# Patient Record
Sex: Female | Born: 1945 | Race: White | Hispanic: No | Smoking: Never smoker
Health system: Southern US, Community
[De-identification: ages and names within clinical notes are randomized; demographics above are authoritative.]

## PROBLEM LIST (undated history)

## (undated) DIAGNOSIS — F445 Conversion disorder with seizures or convulsions: Secondary | ICD-10-CM

## (undated) DIAGNOSIS — K219 Gastro-esophageal reflux disease without esophagitis: Secondary | ICD-10-CM

## (undated) DIAGNOSIS — F32A Depression, unspecified: Secondary | ICD-10-CM

## (undated) DIAGNOSIS — M1990 Unspecified osteoarthritis, unspecified site: Secondary | ICD-10-CM

## (undated) DIAGNOSIS — M810 Age-related osteoporosis without current pathological fracture: Secondary | ICD-10-CM

## (undated) DIAGNOSIS — F419 Anxiety disorder, unspecified: Secondary | ICD-10-CM

## (undated) DIAGNOSIS — F329 Major depressive disorder, single episode, unspecified: Secondary | ICD-10-CM

## (undated) DIAGNOSIS — M109 Gout, unspecified: Secondary | ICD-10-CM

## (undated) DIAGNOSIS — I219 Acute myocardial infarction, unspecified: Secondary | ICD-10-CM

## (undated) DIAGNOSIS — E785 Hyperlipidemia, unspecified: Secondary | ICD-10-CM

## (undated) DIAGNOSIS — G4730 Sleep apnea, unspecified: Secondary | ICD-10-CM

## (undated) DIAGNOSIS — C801 Malignant (primary) neoplasm, unspecified: Secondary | ICD-10-CM

## (undated) DIAGNOSIS — K209 Esophagitis, unspecified without bleeding: Secondary | ICD-10-CM

## (undated) DIAGNOSIS — I10 Essential (primary) hypertension: Secondary | ICD-10-CM

## (undated) HISTORY — PX: TONSILLECTOMY: SUR1361

## (undated) HISTORY — DX: Hyperlipidemia, unspecified: E78.5

## (undated) HISTORY — PX: MULTIPLE TOOTH EXTRACTIONS: SHX2053

## (undated) HISTORY — DX: Essential (primary) hypertension: I10

## (undated) HISTORY — DX: Malignant (primary) neoplasm, unspecified: C80.1

## (undated) HISTORY — PX: WISDOM TOOTH EXTRACTION: SHX21

## (undated) HISTORY — PX: OTHER SURGICAL HISTORY: SHX169

---

## 2008-06-09 ENCOUNTER — Other Ambulatory Visit: Payer: Self-pay

## 2008-06-09 ENCOUNTER — Inpatient Hospital Stay: Payer: Self-pay | Admitting: Internal Medicine

## 2008-08-24 ENCOUNTER — Ambulatory Visit: Payer: Self-pay | Admitting: Gastroenterology

## 2009-10-29 ENCOUNTER — Inpatient Hospital Stay: Payer: Self-pay | Admitting: Internal Medicine

## 2010-04-28 ENCOUNTER — Observation Stay: Payer: Self-pay | Admitting: Internal Medicine

## 2010-05-25 ENCOUNTER — Inpatient Hospital Stay: Payer: Self-pay | Admitting: Internal Medicine

## 2010-11-12 HISTORY — PX: CARDIAC CATHETERIZATION: SHX172

## 2011-02-14 ENCOUNTER — Ambulatory Visit: Payer: Self-pay

## 2011-05-13 HISTORY — PX: CARDIAC CATHETERIZATION: SHX172

## 2011-05-28 ENCOUNTER — Observation Stay: Payer: Self-pay | Admitting: Internal Medicine

## 2011-09-13 HISTORY — PX: CARDIAC CATHETERIZATION: SHX172

## 2011-10-04 ENCOUNTER — Inpatient Hospital Stay: Payer: Self-pay | Admitting: Internal Medicine

## 2011-10-11 ENCOUNTER — Emergency Department: Payer: Self-pay | Admitting: Unknown Physician Specialty

## 2011-10-15 ENCOUNTER — Observation Stay: Payer: Self-pay | Admitting: Internal Medicine

## 2012-11-06 ENCOUNTER — Emergency Department: Payer: Self-pay | Admitting: Emergency Medicine

## 2012-11-06 LAB — COMPREHENSIVE METABOLIC PANEL
Albumin: 4.2 g/dL (ref 3.4–5.0)
Anion Gap: 9 (ref 7–16)
BUN: 15 mg/dL (ref 7–18)
Creatinine: 0.9 mg/dL (ref 0.60–1.30)
Glucose: 84 mg/dL (ref 65–99)
Osmolality: 283 (ref 275–301)
Potassium: 3.3 mmol/L — ABNORMAL LOW (ref 3.5–5.1)
SGOT(AST): 26 U/L (ref 15–37)
Sodium: 142 mmol/L (ref 136–145)
Total Protein: 7.9 g/dL (ref 6.4–8.2)

## 2012-11-06 LAB — CBC
HCT: 37.8 % (ref 35.0–47.0)
HGB: 12.6 g/dL (ref 12.0–16.0)
MCH: 29.1 pg (ref 26.0–34.0)
MCHC: 33.2 g/dL (ref 32.0–36.0)
MCV: 88 fL (ref 80–100)
RDW: 13.8 % (ref 11.5–14.5)

## 2012-11-06 LAB — TROPONIN I: Troponin-I: 0.18 ng/mL — ABNORMAL HIGH

## 2012-11-11 ENCOUNTER — Ambulatory Visit (INDEPENDENT_AMBULATORY_CARE_PROVIDER_SITE_OTHER): Payer: 59 | Admitting: Cardiovascular Disease

## 2012-11-11 ENCOUNTER — Encounter: Payer: Self-pay | Admitting: Cardiovascular Disease

## 2012-11-11 VITALS — BP 142/74 | HR 58 | Ht 65.0 in | Wt 180.8 lb

## 2012-11-11 DIAGNOSIS — R079 Chest pain, unspecified: Secondary | ICD-10-CM

## 2012-11-11 DIAGNOSIS — I10 Essential (primary) hypertension: Secondary | ICD-10-CM

## 2012-11-11 DIAGNOSIS — E785 Hyperlipidemia, unspecified: Secondary | ICD-10-CM

## 2012-11-11 DIAGNOSIS — R0602 Shortness of breath: Secondary | ICD-10-CM

## 2012-11-11 NOTE — Patient Instructions (Addendum)
Your physician has requested that you have an echocardiogram. Echocardiography is a painless test that uses sound waves to create images of your heart. It provides your doctor with information about the size and shape of your heart and how well your heart's chambers and valves are working. This procedure takes approximately one hour. There are no restrictions for this procedure.  Follow up after echo.

## 2012-11-12 ENCOUNTER — Encounter: Payer: Self-pay | Admitting: Cardiovascular Disease

## 2012-11-12 DIAGNOSIS — R079 Chest pain, unspecified: Secondary | ICD-10-CM | POA: Insufficient documentation

## 2012-11-12 DIAGNOSIS — E785 Hyperlipidemia, unspecified: Secondary | ICD-10-CM | POA: Insufficient documentation

## 2012-11-12 DIAGNOSIS — I10 Essential (primary) hypertension: Secondary | ICD-10-CM | POA: Insufficient documentation

## 2012-11-12 NOTE — Assessment & Plan Note (Signed)
I agree with treatment with atorvastatin which might be helpful in cases of endothelial dysfunction.

## 2012-11-12 NOTE — Assessment & Plan Note (Signed)
Recurrent chest pain with mildly elevated troponin with no evidence of obstructive coronary artery disease on cardiac catheterization which was done 3 times since 2010. 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 On one angiography,  I noted slightly sluggish coronary flow especially in the LAD. The other possibility is nonischemic cardiomyopathy given that her previous ejection fraction was mildly decreased. Thus, I will request an echocardiogram to evaluate LV systolic function, RV systolic function and pulmonary pressure.

## 2012-11-12 NOTE — Assessment & Plan Note (Signed)
Blood pressure is well controlled 

## 2012-11-12 NOTE — Progress Notes (Signed)
HPI  This is a 67 year old female who was referred from the emergency room at [facility] for a second opinion regarding recurrent chest pain and mildly elevated cardiac enzymes. She has chronic medical conditions including hypertension and hyperlipidemia. I performed cardiac catheterization on her in 2010 which showed minor luminal irregularities with mildly reduced LV systolic function with an ejection fraction of 45% and no evidence of renal artery stenosis. She has been following with Dr. Humphrey Rolls. She underwent cardiac catheterization twice in 2012 in July and in November for recurrent chest pain and mildly elevated troponin level. Both of these catheterizations showed no evidence of obstructive coronary artery disease. I reviewed these personally. She presented recently to [facility] ER with similar chest pain. Her troponin was 0.18. She denies significant dyspnea. No palpitations, syncope or presyncope.  Allergies  Allergen Reactions  . Codeine      Current Outpatient Prescriptions on File Prior to Visit  Medication Sig Dispense Refill  . amLODipine (NORVASC) 5 MG tablet Take 5 mg by mouth daily.      Marland Kitchen atorvastatin (LIPITOR) 20 MG tablet Take 20 mg by mouth daily.      . carvedilol (COREG) 6.25 MG tablet Take 6.25 mg by mouth 2 (two) times daily with a meal.      . furosemide (LASIX) 20 MG tablet Take 20 mg by mouth daily.      Marland Kitchen losartan (COZAAR) 25 MG tablet Take 25 mg by mouth daily.         Past Medical History  Diagnosis Date  . Cancer     cervical  . Hypertension   . Hyperlipidemia      Past Surgical History  Procedure Date  . Multiple tooth extractions   . Colonoscopy   . Cardiac catheterization 05/2011    [facility]  . [facility]/2011    [facility]  . [facility]iac catheter[facility] 2012    [facility]     Family History  Problem Relation Age of Onset  . Heart disease Mother   . Heart failure Father      History   Social History  . Marital  Status: Single    Spouse Name: N/A    Number of Children: N/A  . Years of Education: N/A   Occupational History  . Not on file.   Social History Main Topics  . Smoking status: Never Smoker   . Smokeless tobacco: Not on file  . Alcohol Use: No  . Drug Use: No  . Sexually Active:    Other Topics Concern  . Not on file   Social History Narrative  . No narrative on file     ROS Constitutional: Negative for fever, chills, diaphoresis, activity change, appetite change and fatigue.  HENT: Negative for hearing loss, nosebleeds, congestion, sore throat, facial swelling, drooling, trouble swallowing, neck pain, voice change, sinus pressure and tinnitus.  Eyes: Negative for photophobia, pain, discharge and visual disturbance.  Respiratory: Negative for apnea, cough, chest tightness, shortness of breath and wheezing.  Cardiovascular: Negative for  palpitations and leg swelling.  Gastrointestinal: Negative for nausea, vomiting, abdominal pain, diarrhea, constipation, blood in stool and abdominal distention.  Genitourinary: Negative for dysuria, urgency, frequency, hematuria and decreased urine volume.  Musculoskeletal: Negative for myalgias, back pain, joint swelling, arthralgias and gait problem.  Skin: Negative for color change, pallor, rash and wound.  Neurological: Negative for dizziness, tremors, seizures, syncope, speech difficulty, weakness, light-headedness, numbness and headaches.  Psychiatric/Behavioral: Negative for suicidal ideas, hallucinations,  behavioral problems and agitation. The patient is not nervous/anxious.      PHYSICAL EXAM   BP 142/74  Pulse 58  Ht 5\' 5"  (1.651 m)  Wt 180 lb 12 oz (81.988 kg)  BMI 30.08 kg/m2 Constitutional: She is oriented to person, place, and time. She appears well-developed and well-nourished. No distress.  HENT: No nasal discharge.  Head: Normocephalic and atraumatic.  Eyes: Pupils are equal and round. Right eye exhibits no discharge.  Left eye exhibits no discharge.  Neck: Normal range of motion. Neck supple. No JVD present. No thyromegaly present.  Cardiovascular: Normal rate, regular rhythm, normal heart sounds. Exam reveals no gallop and no friction rub. There is a 1/6 systolic ejection murmur at the aortic area.  Pulmonary/Chest: Effort normal and breath sounds normal. No stridor. No respiratory distress. She has no wheezes. She has no rales. She exhibits no tenderness.  Abdominal: Soft. Bowel sounds are normal. She exhibits no distension. There is no tenderness. There is no rebound and no guarding.  Musculoskeletal: Normal range of motion. She exhibits no edema and no tenderness.  Neurological: She is alert and oriented to person, place, and time. Coordination normal.  Skin: Skin is warm and dry. No rash noted. She is not diaphoretic. No erythema. No pallor.  Psychiatric: She has a normal mood and affect. Her behavior is normal. Judgment and thought content normal.     EKG: Sinus  Bradycardia  WITHIN NORMAL LIMITS   ASSESSMENT AND PLAN

## 2012-12-02 ENCOUNTER — Other Ambulatory Visit (INDEPENDENT_AMBULATORY_CARE_PROVIDER_SITE_OTHER): Payer: 59

## 2012-12-02 ENCOUNTER — Other Ambulatory Visit: Payer: Self-pay

## 2012-12-02 DIAGNOSIS — R002 Palpitations: Secondary | ICD-10-CM

## 2012-12-02 DIAGNOSIS — R079 Chest pain, unspecified: Secondary | ICD-10-CM

## 2012-12-02 DIAGNOSIS — R0602 Shortness of breath: Secondary | ICD-10-CM

## 2012-12-02 DIAGNOSIS — I359 Nonrheumatic aortic valve disorder, unspecified: Secondary | ICD-10-CM

## 2012-12-08 ENCOUNTER — Encounter: Payer: Self-pay | Admitting: Cardiovascular Disease

## 2012-12-08 ENCOUNTER — Ambulatory Visit (INDEPENDENT_AMBULATORY_CARE_PROVIDER_SITE_OTHER): Payer: 59 | Admitting: Cardiovascular Disease

## 2012-12-08 VITALS — BP 140/83 | HR 64 | Ht 64.0 in | Wt 184.5 lb

## 2012-12-08 DIAGNOSIS — I10 Essential (primary) hypertension: Secondary | ICD-10-CM

## 2012-12-08 DIAGNOSIS — R079 Chest pain, unspecified: Secondary | ICD-10-CM

## 2012-12-08 NOTE — Assessment & Plan Note (Signed)
Blood pressure is reasonably controlled. 

## 2012-12-08 NOTE — Patient Instructions (Addendum)
Continue same medications.  Start an exercise program.  Follow up as needed.

## 2012-12-08 NOTE — Progress Notes (Signed)
HPI  This is a 67 year old female who is here today for a followup visit after a second opinion regarding recurrent chest pain and mildly elevated cardiac enzymes. She has chronic medical conditions including hypertension and hyperlipidemia. I performed cardiac catheterization on her in 2010 which showed minor luminal irregularities with mildly reduced LV systolic function with an ejection fraction of 45% and no evidence of renal artery stenosis. She has been following with Dr. Humphrey Rolls. She underwent cardiac catheterization twice in 2012 in July and in November for recurrent chest pain and mildly elevated troponin level. Both of these catheterizations showed no evidence of obstructive coronary artery disease. I reviewed these personally. She presented recently to [facility] ER with similar chest pain. Her troponin was 0.18.  No palpitations, syncope or presyncope. She had an echocardiogram done which showed normal LV systolic function, no significant valvular abnormalities and no evidence of pulmonary hypertension. She denies recurrent chest pain. She does have exertional with moderate activities. She is a lifelong nonsmoker but has been exposed to secondhand smoking throughout her life.  Allergies  Allergen Reactions  . Albuterol   . Codeine      Current Outpatient Prescriptions on File Prior to Visit  Medication Sig Dispense Refill  . Acetaminophen (TYLENOL PO) Take by mouth as needed.      Marland Kitchen amLODipine (NORVASC) 5 MG tablet Take 5 mg by mouth daily.      Marland Kitchen aspirin 81 MG tablet Take 81 mg by mouth daily.      Marland Kitchen atorvastatin (LIPITOR) 20 MG tablet Take 20 mg by mouth daily.      . carvedilol (COREG) 6.25 MG tablet Take 6.25 mg by mouth 2 (two) times daily with a meal.      . furosemide (LASIX) 20 MG tablet Take 20 mg by mouth daily.      Marland Kitchen losartan (COZAAR) 25 MG tablet Take 25 mg by mouth daily.      . Multiple Vitamin (MULTIVITAMIN) tablet Take 1 tablet by mouth  daily.         Past Medical History  Diagnosis Date  . Cancer     cervical  . Hypertension   . Hyperlipidemia      Past Surgical History  Procedure Date  . Multiple tooth extractions   . Colonoscopy   . Cardiac catheterization 05/2011    [facility]  . [facility]2011    [facility]  . [facility]iac catheterization 2012    [facility]     Family History  Problem Relation Age of Onset  . Heart disease Mother   . Heart failure Father      History   Social History  . Marital Status: Single    Spouse Name: N/A    Number of Children: N/A  . Years of Education: N/A   Occupational History  . Not on file.   Social History Main Topics  . Smoking status: Never Smoker   . Smokeless tobacco: Not on file  . Alcohol Use: No  . Drug Use: No  . Sexually Active:    Other Topics Concern  . Not on file   Social History Narrative  . No narrative on file        PHYSICAL EXAM   BP 140/83  Pulse 64  Ht 5\' 4"  (1.626 m)  Wt 184 lb 8 oz (83.689 kg)  BMI 31.67 kg/m2 Constitutional: She is oriented to person, place, and time. She appears well-developed and well-nourished. No distress.  HENT: No nasal discharge.  Head: Normocephalic and atraumatic.  Eyes: Pupils are equal and round. Right eye exhibits no discharge. Left eye exhibits no discharge.  Neck: Normal range of motion. Neck supple. No JVD present. No thyromegaly present.  Cardiovascular: Normal rate, regular rhythm, normal heart sounds. Exam reveals no gallop and no friction rub. There is a 1/6 systolic ejection murmur at the aortic area.  Pulmonary/Chest: Effort normal and breath sounds normal. No stridor. No respiratory distress. She has no wheezes. She has no rales. She exhibits no tenderness.  Abdominal: Soft. Bowel sounds are normal. She exhibits no distension. There is no tenderness. There is no rebound and no guarding.  Musculoskeletal: Normal range of motion. She exhibits no edema and no tenderness.    Neurological: She is alert and oriented to person, place, and time. Coordination normal.  Skin: Skin is warm and dry. No rash noted. She is not diaphoretic. No erythema. No pallor.  Psychiatric: She has a normal mood and affect. Her behavior is normal. Judgment and thought content normal.      ASSESSMENT AND PLAN

## 2012-12-08 NOTE — Assessment & Plan Note (Signed)
Recurrent chest pain with mildly elevated troponin with no evidence of obstructive coronary artery disease on cardiac catheterization which was done 3 times since 2010. 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 On one angiography,  there was sluggish coronary flow especially in the LAD. Echocardiogram showed normal LV systolic function with no evidence of pulmonary hypertension. Continue medical therapy. She does have sublingual nitroglycerin to be used as needed. I advised her to start an exercise program at least 5 days a week. She will be following with Dr.Khan.

## 2013-04-02 ENCOUNTER — Ambulatory Visit: Payer: Self-pay | Admitting: Nurse Practitioner

## 2013-08-11 ENCOUNTER — Emergency Department: Payer: Self-pay | Admitting: Emergency Medicine

## 2013-08-11 LAB — TROPONIN I
Troponin-I: 0.14 ng/mL — ABNORMAL HIGH
Troponin-I: 0.14 ng/mL — ABNORMAL HIGH

## 2013-08-11 LAB — CBC
HGB: 11.4 g/dL — ABNORMAL LOW (ref 12.0–16.0)
MCH: 30.4 pg (ref 26.0–34.0)
MCHC: 34.5 g/dL (ref 32.0–36.0)
Platelet: 182 10*3/uL (ref 150–440)
RDW: 14.3 % (ref 11.5–14.5)
WBC: 7 10*3/uL (ref 3.6–11.0)

## 2013-08-11 LAB — BASIC METABOLIC PANEL
Anion Gap: 5 — ABNORMAL LOW (ref 7–16)
Calcium, Total: 8.9 mg/dL (ref 8.5–10.1)
Co2: 30 mmol/L (ref 21–32)
Creatinine: 1.15 mg/dL (ref 0.60–1.30)
EGFR (African American): 57 — ABNORMAL LOW
Glucose: 119 mg/dL — ABNORMAL HIGH (ref 65–99)
Osmolality: 278 (ref 275–301)
Potassium: 3.2 mmol/L — ABNORMAL LOW (ref 3.5–5.1)
Sodium: 137 mmol/L (ref 136–145)

## 2013-08-11 LAB — PRO B NATRIURETIC PEPTIDE: B-Type Natriuretic Peptide: 172 pg/mL — ABNORMAL HIGH (ref 0–125)

## 2014-08-08 ENCOUNTER — Emergency Department: Payer: Self-pay | Admitting: Emergency Medicine

## 2014-08-08 LAB — CBC
HCT: 37.2 % (ref 35.0–47.0)
HGB: 12.1 g/dL (ref 12.0–16.0)
MCH: 29.9 pg (ref 26.0–34.0)
MCHC: 32.5 g/dL (ref 32.0–36.0)
MCV: 92 fL (ref 80–100)
PLATELETS: 154 10*3/uL (ref 150–440)
RBC: 4.05 10*6/uL (ref 3.80–5.20)
RDW: 13.6 % (ref 11.5–14.5)
WBC: 9.1 10*3/uL (ref 3.6–11.0)

## 2014-08-08 LAB — BASIC METABOLIC PANEL
Anion Gap: 7 (ref 7–16)
BUN: 18 mg/dL (ref 7–18)
CHLORIDE: 102 mmol/L (ref 98–107)
CO2: 30 mmol/L (ref 21–32)
Calcium, Total: 8.2 mg/dL — ABNORMAL LOW (ref 8.5–10.1)
Creatinine: 1.26 mg/dL (ref 0.60–1.30)
EGFR (African American): 54 — ABNORMAL LOW
EGFR (Non-African Amer.): 45 — ABNORMAL LOW
GLUCOSE: 112 mg/dL — AB (ref 65–99)
OSMOLALITY: 280 (ref 275–301)
POTASSIUM: 3.3 mmol/L — AB (ref 3.5–5.1)
Sodium: 139 mmol/L (ref 136–145)

## 2014-08-08 LAB — TROPONIN I
TROPONIN-I: 0.22 ng/mL — AB
TROPONIN-I: 0.21 ng/mL — AB

## 2015-03-05 NOTE — Consult Note (Signed)
PATIENT NAME:  Elaine Norris, DELERME MR#:  [id number] DATE OF BIRTH:  09-13-1946  DATE OF CONSULTATION:  08/08/2014  REFERRING PHYSICIAN:   CONSULTING PHYSICIAN:  Dionisio David, MD  INDICATION FOR CONSULTATION: Atypical chest pain.   HISTORY OF PRESENT ILLNESS: This is a 69 year old, white female, with a past medical history of mild coronary artery disease, who presented to the Emergency Room with severe back pain. She has history of osteoporosis and she was started on new medications, and she came in complaining of severe back pain radiating to the front of the chest. She underwent CTA angiography of the aorta, which showed no significant changes, no evidence of dissection. She also has apparently mildly elevated troponin chronically.   Her past medical history is significant for having cardiac catheterization in the past, a few years back, which was negative, and had a stress test recently which was unremarkable.   Her troponin was 0.21 and she always has a little bit elevated troponin; thus, I was asked to evaluate the patient.   The patient at this time denies any chest pain or shortness of breath. Back pain is resolving.   PAST MEDICAL HISTORY: As mentioned, has had cardiac catheterization in the past.   SOCIAL HISTORY: Unremarkable.   FAMILY HISTORY: Positive for coronary artery disease.   PHYSICAL EXAMINATION:  GENERAL: She is alert, oriented x 3, in no acute distress.  VITAL SIGNS: Her blood pressure is a little bit elevated, 180/77, pulse is 102, respirations 14, saturation is 100.  NECK: Revealed no JVD.  LUNGS: Clear.  HEART: Regular rate and rhythm. Normal S1, S2. No audible murmur.  ABDOMEN: Soft, nontender, positive bowel sounds.  EXTREMITIES: No pedal edema.   LABORATORY AND DIAGNOSTIC DATA: EKG shows normal sinus rhythm, 67 beats per minute within normal limits.   Troponin is 0.21, BUN 18, creatinine 1.26, potassium 3.3. Troponin second set was also 0.22.   White  count is 9.1, hemoglobin is 12.1.   ASSESSMENT AND PLAN: The patient has atypical chest pain, but mostly back pain. CT angiogram was negative to rule out dissection. She has chronically elevated troponin. She had a negative cardiac catheterization in the past. It is reasonable to discharge the patient with a follow-up in the office tomorrow at 10:00.   Thank you very much for the referral.    ____________________________ Dionisio David, MD sak:JT D: 08/08/2014 11:49:06 ET T: 08/08/2014 12:25:02 ET JOB#: [id number]  cc: Dionisio David, MD, <Dictator> Dionisio David MD ELECTRONICALLY SIGNED 08/19/2014 9:55

## 2015-03-08 ENCOUNTER — Other Ambulatory Visit: Payer: Self-pay | Admitting: Internal Medicine

## 2015-03-08 DIAGNOSIS — Z1231 Encounter for screening mammogram for malignant neoplasm of breast: Secondary | ICD-10-CM

## 2015-03-17 ENCOUNTER — Ambulatory Visit
Admission: RE | Admit: 2015-03-17 | Discharge: 2015-03-17 | Disposition: A | Discharge status: Home / Self Care | Payer: Medicare Other | Source: Ambulatory Visit | Attending: Internal Medicine | Admitting: Internal Medicine

## 2015-03-17 ENCOUNTER — Other Ambulatory Visit: Payer: Self-pay | Admitting: Internal Medicine

## 2015-03-17 DIAGNOSIS — Z1231 Encounter for screening mammogram for malignant neoplasm of breast: Secondary | ICD-10-CM

## 2016-04-06 ENCOUNTER — Other Ambulatory Visit: Payer: Self-pay | Admitting: Internal Medicine

## 2016-04-06 DIAGNOSIS — Z1231 Encounter for screening mammogram for malignant neoplasm of breast: Secondary | ICD-10-CM

## 2016-04-24 ENCOUNTER — Other Ambulatory Visit: Payer: Self-pay | Admitting: Internal Medicine

## 2016-04-24 ENCOUNTER — Ambulatory Visit
Admission: RE | Admit: 2016-04-24 | Discharge: 2016-04-24 | Disposition: A | Discharge status: Home / Self Care | Payer: Medicare Other | Source: Ambulatory Visit | Attending: Internal Medicine | Admitting: Internal Medicine

## 2016-04-24 DIAGNOSIS — Z1231 Encounter for screening mammogram for malignant neoplasm of breast: Secondary | ICD-10-CM | POA: Diagnosis not present

## 2016-12-26 ENCOUNTER — Emergency Department: Payer: Medicare Other

## 2016-12-26 ENCOUNTER — Emergency Department
Admission: EM | Admit: 2016-12-26 | Discharge: 2016-12-26 | Disposition: A | Discharge status: Home / Self Care | Payer: Medicare Other | Attending: Emergency Medicine | Admitting: Emergency Medicine

## 2016-12-26 ENCOUNTER — Encounter: Payer: Self-pay | Admitting: Emergency Medicine

## 2016-12-26 DIAGNOSIS — M25511 Pain in right shoulder: Secondary | ICD-10-CM | POA: Insufficient documentation

## 2016-12-26 DIAGNOSIS — Z79899 Other long term (current) drug therapy: Secondary | ICD-10-CM | POA: Diagnosis not present

## 2016-12-26 DIAGNOSIS — M542 Cervicalgia: Secondary | ICD-10-CM | POA: Diagnosis not present

## 2016-12-26 DIAGNOSIS — Z7982 Long term (current) use of aspirin: Secondary | ICD-10-CM | POA: Insufficient documentation

## 2016-12-26 DIAGNOSIS — Z8541 Personal history of malignant neoplasm of cervix uteri: Secondary | ICD-10-CM | POA: Diagnosis not present

## 2016-12-26 DIAGNOSIS — I10 Essential (primary) hypertension: Secondary | ICD-10-CM | POA: Insufficient documentation

## 2016-12-26 MED ORDER — PREDNISONE 10 MG (21) PO TBPK: 10.0000 mg | ORAL_TABLET | Freq: Every day | ORAL | 0 refills | Status: DC

## 2016-12-26 MED ORDER — TRAMADOL HCL 50 MG PO TABS
ORAL_TABLET | ORAL | Status: AC
  Filled 2016-12-26: qty 1

## 2016-12-26 MED ORDER — TRAMADOL HCL 50 MG PO TABS
25.0000 mg | ORAL_TABLET | Freq: Once | ORAL | Status: AC
  Administered 2016-12-26: 25 mg via ORAL

## 2016-12-26 NOTE — ED Provider Notes (Signed)
[facility] Emergency Department Provider Note  ____________________________________________  Time seen: Approximately 8:22 PM  I have reviewed the triage vital signs and the nursing notes.   HISTORY  Chief Complaint Neck Pain; Shoulder Pain; and Chest Pain    HPI Elaine Norris is a 71 y.o. female presenting to the emergency department with 9 out of 10 right shoulder pain that radiates to the elbow but not into the hands for the past 3 days. Patient describes right shoulder pain as aching. Patient has noticed that it has becoming difficult for her to reach up and behind her. Patient is sleeping on her left shoulder to avoid sleeping on the right shoulder. Right shoulder pain is keeping her up at night. Patient has only taken Tylenol for pain. Patient denies chest pain, chest tightness, shortness of breath, nausea, vomiting and a feeling of impending doom.   Past Medical History:  Diagnosis Date  . Cancer (HCC)    cervical  . Hyperlipidemia   . Hypertension     Patient Active Problem List   Diagnosis Date Noted  . Chest pain 11/12/2012  . Hypertension   . Hyperlipidemia     Past Surgical History:  Procedure Laterality Date  . CARDIAC CATHETERIZATION  05/2011   [facility]  . CARDIAC CATHETERIZATION  09/2011   [facility]  . CARDIAC CATHETERIZATION  2012   [facility]  . COLONOSCOPY    . MULTIPLE TOOTH EXTRACTIONS      Prior to Admission medications   Medication Sig Start Date End Date Taking? Authorizing Provider  Acetaminophen (TYLENOL PO) Take by mouth as needed.    Historical Provider, MD  amLODipine (NORVASC) 5 MG tablet Take 5 mg by mouth daily.    Historical Provider, MD  aspirin 81 MG tablet Take 81 mg by mouth daily.    Historical Provider, MD  atorvastatin (LIPITOR) 20 MG tablet Take 20 mg by mouth daily.    Historical Provider, MD  carvedilol (COREG) 6.25 MG tablet Take 6.25 mg by mouth 2 (two) times daily with a meal.    Historical Provider, MD   furosemide (LASIX) 20 MG tablet Take 20 mg by mouth daily.    Historical Provider, MD  losartan (COZAAR) 25 MG tablet Take 25 mg by mouth daily.    Historical Provider, MD  Multiple Vitamin (MULTIVITAMIN) tablet Take 1 tablet by mouth daily.    Historical Provider, MD  predniSONE (STERAPRED UNI-PAK 21 TAB) 10 MG (21) TBPK tablet Take 1 tablet (10 mg total) by mouth daily. Take 6 tablets the first day, take 5 tablets the second day, take 4 tablets the third day, take 3 tablets the fourth day, take 2 tablets the fifth day, take 1 tablet the sixth day. 12/26/16   Lannie Fields, PA-C    Allergies Albuterol and Codeine  Family History  Problem Relation Age of Onset  . Heart disease Mother   . Heart failure Father     Social History Social History  Substance Use Topics  . Smoking status: Never Smoker  . Smokeless tobacco: Never Used  . Alcohol use No     Review of Systems  Constitutional: No fever/chills Eyes: No visual changes. No discharge ENT: No upper respiratory complaints. Cardiovascular: no chest pain. Respiratory: no cough. No SOB. Gastrointestinal: No abdominal pain.  No nausea, no vomiting.  No diarrhea.  No constipation. Musculoskeletal: Patient has right shoulder pain.  Skin: Negative for rash, abrasions, lacerations, ecchymosis. Neurological: Negative for headaches, focal weakness or numbness.  ____________________________________________   PHYSICAL EXAM:  VITAL SIGNS: ED Triage Vitals  Enc Vitals Group     BP 12/26/16 1926 (!) 150/99     Pulse Rate 12/26/16 1926 72     Resp 12/26/16 1926 16     Temp 12/26/16 1926 98.2 F (36.8 C)     Temp Source 12/26/16 1926 Oral     SpO2 12/26/16 1926 99 %     Weight 12/26/16 1926 190 lb (86.2 kg)     Height 12/26/16 1926 5\' 5"  (1.651 m)     Head Circumference --      Peak Flow --      Pain Score 12/26/16 2000 9     Pain Loc --      Pain Edu? --      Excl. in [place]? --      Constitutional: Alert and oriented. Well  appearing and in no acute distress. Eyes: Conjunctivae are normal. PERRL. EOMI. Head: Atraumatic. Neck: No stridor. Full range of motion. Patient's symptoms are not reproduced with range of motion at the neck. Cardiovascular: Normal rate, regular rhythm. Normal S1 and S2.  Good peripheral circulation. Respiratory: Normal respiratory effort without tachypnea or retractions. Lungs CTAB. Good air entry to the bases with no decreased or absent breath sounds. Musculoskeletal: To inspection, patient is holding right shoulder higher than the left. Right upper extremity: Patient is unable to perform full range of motion at the shoulder, likely secondary to pain. Patient has full range of motion at the elbow and wrist. Patient has pain with palpation over the deltoid and supraspinatus. Patient has pain and weakness with rotator cuff testing. Palpable radial and ulnar pulses bilaterally and symmetrically. Neurologic:  Normal speech and language. No gross focal neurologic deficits are appreciated. Reflexes are 2+ and symmetric in the upper extremities bilaterally. Skin:  Skin is warm, dry and intact. No rash noted. Psychiatric: Mood and affect are normal. Speech and behavior are normal. Patient exhibits appropriate insight and judgement.   ____________________________________________   LABS (all labs ordered are listed, but only abnormal results are displayed)  Labs Reviewed - No data to display ____________________________________________  EKG   ____________________________________________  RADIOLOGY Unk Pinto, personally viewed and evaluated these images (plain radiographs) as part of my medical decision making, as well as reviewing the written report by the radiologist.    Dg Cervical Spine Complete  Result Date: 12/26/2016 CLINICAL DATA:  Right neck pain radiating through shoulder and right arm for 3 days. No injury. EXAM: CERVICAL SPINE - COMPLETE 4+ VIEW COMPARISON:  None.  FINDINGS: Normal alignment of the cervical spine. No vertebral compression deformities. No prevertebral soft tissue swelling. Degenerative changes throughout the cervical spine with narrowed cervical interspaces and endplate hypertrophic changes. Degenerative changes in the facet joints. No significant bone encroachment upon the neural foramina. No focal bone lesion or bone destruction. Vascular calcifications in the soft tissues. IMPRESSION: Normal alignment of the cervical spine. Diffuse degenerative changes. No acute displaced fractures identified. Electronically Signed   By: Lucienne Capers M.D.   On: 12/26/2016 21:20   Dg Shoulder Right  Result Date: 12/26/2016 CLINICAL DATA:  Right neck pain radiating through the shoulder and down the right arm for 3 days. No injury. EXAM: RIGHT SHOULDER - 2+ VIEW COMPARISON:  None. FINDINGS: There is no evidence of fracture or dislocation. There is no evidence of arthropathy or other focal bone abnormality. Soft tissues are unremarkable. IMPRESSION: Negative. Electronically Signed   By: Lucienne Capers  M.D.   On: 12/26/2016 21:18    ____________________________________________    PROCEDURES  Procedure(s) performed:    Procedures    Medications  traMADol (ULTRAM) tablet 25 mg (25 mg Oral Given 12/26/16 2025)     ____________________________________________   INITIAL IMPRESSION / ASSESSMENT AND PLAN / ED COURSE  Pertinent labs & imaging results that were available during my care of the patient were reviewed by me and considered in my medical decision making (see chart for details).  Review of the [place] CSRS was performed in accordance of the [place] prior to dispensing any controlled drugs.     Assessment and Plan: Right Shoulder Pain  Patient presents to the emergency department with right shoulder pain. DG right shoulder idicates no acute bony abnormalities. DG cervical spine indicates diffuse degenerative changes. On physical exam, patient  did not have reproducible symptoms with range of motion testing at the neck. In addition, patient does not have referred pain extending into the hand. Patient's pain stops at the right elbow, increasing suspicion for rotator cuff pathology. Patient was discharged with tapered prednisone. A referral was made to orthopedics, Dr. Roland Rack. Patient was advised to make an appointment in one week if right shoulder pain persists. All patient questions were answered. ___________________________________________  FINAL CLINICAL IMPRESSION(S) / ED DIAGNOSES  Final diagnoses:  Acute pain of right shoulder      NEW MEDICATIONS STARTED DURING THIS VISIT:  Discharge Medication List as of 12/26/2016  9:44 PM    START taking these medications   Details  predniSONE (STERAPRED UNI-PAK 21 TAB) 10 MG (21) TBPK tablet Take 1 tablet (10 mg total) by mouth daily. Take 6 tablets the first day, take 5 tablets the second day, take 4 tablets the third day, take 3 tablets the fourth day, take 2 tablets the fifth day, take 1 tablet the sixth day., Starting Wed 12/26/2016, Prin t            This chart was dictated using voice recognition software/Dragon. Despite best efforts to proofread, errors can occur which can change the meaning. Any change was purely unintentional.    Lannie Fields, PA-C 12/27/16 0024    Lisa Roca, MD 12/29/16 [phone]

## 2016-12-26 NOTE — ED Notes (Addendum)
See triage note, pt reports 3 days ago she developed pain at the base of her head to the right elbow and is now stating she has pain that radiates to the right side of chest. Pt denies SOB. Pt denies inj to area and reports taking tylenol and alternating heat without improvement. Pt is able to have ROM to arm and neck however reports increased pain when she does. Pt reports the pain comes in waves and "when it hits it's about a 8 to 9"

## 2016-12-26 NOTE — ED Triage Notes (Addendum)
Pt to triage in wheelchair due to pain. Pt c/o right neck pain that radiates through shoulder and down right arm x3 days. Pt denies injury, chest pain. Pt sts pain is a intermittent pain that comes and goes, worse with movement. Tender to the touch.

## 2017-01-29 ENCOUNTER — Other Ambulatory Visit: Payer: Self-pay | Admitting: Internal Medicine

## 2017-01-29 DIAGNOSIS — N63 Unspecified lump in unspecified breast: Secondary | ICD-10-CM

## 2017-01-29 DIAGNOSIS — M542 Cervicalgia: Secondary | ICD-10-CM

## 2017-02-05 ENCOUNTER — Ambulatory Visit
Admission: RE | Admit: 2017-02-05 | Discharge: 2017-02-05 | Disposition: A | Discharge status: Home / Self Care | Payer: Medicare Other | Source: Ambulatory Visit | Attending: Internal Medicine | Admitting: Internal Medicine

## 2017-02-05 DIAGNOSIS — N63 Unspecified lump in unspecified breast: Secondary | ICD-10-CM | POA: Diagnosis not present

## 2017-02-09 ENCOUNTER — Ambulatory Visit
Admission: RE | Admit: 2017-02-09 | Discharge: 2017-02-09 | Disposition: A | Discharge status: Home / Self Care | Payer: Medicare Other | Source: Ambulatory Visit | Attending: Internal Medicine | Admitting: Internal Medicine

## 2017-02-09 DIAGNOSIS — M4802 Spinal stenosis, cervical region: Secondary | ICD-10-CM | POA: Insufficient documentation

## 2017-02-09 DIAGNOSIS — M542 Cervicalgia: Secondary | ICD-10-CM

## 2017-02-09 DIAGNOSIS — M50223 Other cervical disc displacement at C6-C7 level: Secondary | ICD-10-CM | POA: Insufficient documentation

## 2017-02-25 ENCOUNTER — Observation Stay
Admission: EM | Admit: 2017-02-25 | Discharge: 2017-02-26 | Disposition: A | Discharge status: Home / Self Care | Payer: Medicare Other | Attending: Internal Medicine | Admitting: Internal Medicine

## 2017-02-25 ENCOUNTER — Emergency Department: Payer: Medicare Other

## 2017-02-25 ENCOUNTER — Encounter: Payer: Self-pay | Admitting: Emergency Medicine

## 2017-02-25 DIAGNOSIS — T428X5A Adverse effect of antiparkinsonism drugs and other central muscle-tone depressants, initial encounter: Secondary | ICD-10-CM | POA: Insufficient documentation

## 2017-02-25 DIAGNOSIS — E871 Hypo-osmolality and hyponatremia: Secondary | ICD-10-CM | POA: Insufficient documentation

## 2017-02-25 DIAGNOSIS — M5412 Radiculopathy, cervical region: Secondary | ICD-10-CM | POA: Insufficient documentation

## 2017-02-25 DIAGNOSIS — I10 Essential (primary) hypertension: Secondary | ICD-10-CM | POA: Diagnosis not present

## 2017-02-25 DIAGNOSIS — E785 Hyperlipidemia, unspecified: Secondary | ICD-10-CM | POA: Insufficient documentation

## 2017-02-25 DIAGNOSIS — T426X5A Adverse effect of other antiepileptic and sedative-hypnotic drugs, initial encounter: Secondary | ICD-10-CM | POA: Diagnosis not present

## 2017-02-25 DIAGNOSIS — Z79899 Other long term (current) drug therapy: Secondary | ICD-10-CM | POA: Insufficient documentation

## 2017-02-25 DIAGNOSIS — R258 Other abnormal involuntary movements: Principal | ICD-10-CM | POA: Insufficient documentation

## 2017-02-25 DIAGNOSIS — Z8541 Personal history of malignant neoplasm of cervix uteri: Secondary | ICD-10-CM | POA: Insufficient documentation

## 2017-02-25 DIAGNOSIS — R253 Fasciculation: Secondary | ICD-10-CM | POA: Diagnosis present

## 2017-02-25 DIAGNOSIS — Z7982 Long term (current) use of aspirin: Secondary | ICD-10-CM | POA: Diagnosis not present

## 2017-02-25 DIAGNOSIS — I639 Cerebral infarction, unspecified: Secondary | ICD-10-CM

## 2017-02-25 LAB — COMPREHENSIVE METABOLIC PANEL
ALBUMIN: 3.6 g/dL (ref 3.5–5.0)
ALK PHOS: 51 U/L (ref 38–126)
ALT: 26 U/L (ref 14–54)
AST: 26 U/L (ref 15–41)
Anion gap: 7 (ref 5–15)
BUN: 17 mg/dL (ref 6–20)
CALCIUM: 8.8 mg/dL — AB (ref 8.9–10.3)
CHLORIDE: 102 mmol/L (ref 101–111)
CO2: 28 mmol/L (ref 22–32)
CREATININE: 1.2 mg/dL — AB (ref 0.44–1.00)
GFR calc Af Amer: 51 mL/min — ABNORMAL LOW (ref >60)
GFR calc non Af Amer: 44 mL/min — ABNORMAL LOW (ref >60)
GLUCOSE: 175 mg/dL — AB (ref 65–99)
Potassium: 3.8 mmol/L (ref 3.5–5.1)
SODIUM: 137 mmol/L (ref 135–145)
Total Bilirubin: 0.7 mg/dL (ref 0.3–1.2)
Total Protein: 6.5 g/dL (ref 6.5–8.1)

## 2017-02-25 LAB — GLUCOSE, CAPILLARY: GLUCOSE-CAPILLARY: 153 mg/dL — AB (ref 65–99)

## 2017-02-25 LAB — CBC
HCT: 35.6 % (ref 35.0–47.0)
HEMOGLOBIN: 12.1 g/dL (ref 12.0–16.0)
MCH: 31.1 pg (ref 26.0–34.0)
MCHC: 34 g/dL (ref 32.0–36.0)
MCV: 91.5 fL (ref 80.0–100.0)
PLATELETS: 170 10*3/uL (ref 150–440)
RBC: 3.89 MIL/uL (ref 3.80–5.20)
RDW: 15 % — ABNORMAL HIGH (ref 11.5–14.5)
WBC: 6.6 10*3/uL (ref 3.6–11.0)

## 2017-02-25 MED ORDER — ACETAMINOPHEN 650 MG RE SUPP: 650.0000 mg | Freq: Four times a day (QID) | RECTAL | Status: DC | PRN

## 2017-02-25 MED ORDER — CALCIUM CARBONATE-VITAMIN D3 600-400 MG-UNIT PO TABS: ORAL_TABLET | Freq: Two times a day (BID) | ORAL | Status: DC

## 2017-02-25 MED ORDER — SODIUM CHLORIDE 0.9% FLUSH
3.0000 mL | Freq: Two times a day (BID) | INTRAVENOUS | Status: DC
  Administered 2017-02-25 – 2017-02-26 (×2): 3 mL via INTRAVENOUS

## 2017-02-25 MED ORDER — CALCIUM CARBONATE-VITAMIN D 500-200 MG-UNIT PO TABS
1.0000 | ORAL_TABLET | Freq: Every day | ORAL | Status: DC
  Administered 2017-02-26: 1 via ORAL
  Filled 2017-02-25: qty 1

## 2017-02-25 MED ORDER — ATORVASTATIN CALCIUM 20 MG PO TABS
40.0000 mg | ORAL_TABLET | Freq: Every day | ORAL | Status: DC
  Administered 2017-02-26: 08:00:00 40 mg via ORAL
  Filled 2017-02-25: qty 2

## 2017-02-25 MED ORDER — ONDANSETRON HCL 4 MG/2ML IJ SOLN: 4.0000 mg | Freq: Four times a day (QID) | INTRAMUSCULAR | Status: DC | PRN

## 2017-02-25 MED ORDER — LOSARTAN POTASSIUM 50 MG PO TABS
100.0000 mg | ORAL_TABLET | Freq: Every day | ORAL | Status: DC
  Administered 2017-02-26: 08:00:00 100 mg via ORAL
  Filled 2017-02-25: qty 2

## 2017-02-25 MED ORDER — ENOXAPARIN SODIUM 40 MG/0.4ML ~~LOC~~ SOLN
40.0000 mg | SUBCUTANEOUS | Status: DC
  Administered 2017-02-25: 40 mg via SUBCUTANEOUS
  Filled 2017-02-25: qty 0.4

## 2017-02-25 MED ORDER — MELOXICAM 7.5 MG PO TABS
15.0000 mg | ORAL_TABLET | Freq: Every day | ORAL | Status: DC
  Administered 2017-02-26: 15 mg via ORAL
  Filled 2017-02-25: qty 2

## 2017-02-25 MED ORDER — HYDROCHLOROTHIAZIDE 12.5 MG PO CAPS
12.5000 mg | ORAL_CAPSULE | Freq: Every day | ORAL | Status: DC
  Administered 2017-02-25 – 2017-02-26 (×2): 12.5 mg via ORAL
  Filled 2017-02-25 (×2): qty 1

## 2017-02-25 MED ORDER — ONDANSETRON HCL 4 MG PO TABS: 4.0000 mg | ORAL_TABLET | Freq: Four times a day (QID) | ORAL | Status: DC | PRN

## 2017-02-25 MED ORDER — HYDRALAZINE HCL 20 MG/ML IJ SOLN
10.0000 mg | Freq: Four times a day (QID) | INTRAMUSCULAR | Status: DC | PRN
  Administered 2017-02-26: 05:00:00 10 mg via INTRAVENOUS
  Filled 2017-02-25: qty 1

## 2017-02-25 MED ORDER — ASPIRIN EC 81 MG PO TBEC
81.0000 mg | DELAYED_RELEASE_TABLET | Freq: Every day | ORAL | Status: DC
  Administered 2017-02-26: 81 mg via ORAL
  Filled 2017-02-25: qty 1

## 2017-02-25 MED ORDER — LOSARTAN POTASSIUM-HCTZ 100-12.5 MG PO TABS: 1.0000 | ORAL_TABLET | Freq: Every day | ORAL | Status: DC

## 2017-02-25 MED ORDER — SODIUM CHLORIDE 0.9 % IV SOLN
INTRAVENOUS | Status: DC
  Administered 2017-02-25 – 2017-02-26 (×2): via INTRAVENOUS

## 2017-02-25 MED ORDER — HYDRALAZINE HCL 50 MG PO TABS
100.0000 mg | ORAL_TABLET | Freq: Two times a day (BID) | ORAL | Status: DC
  Administered 2017-02-25 – 2017-02-26 (×2): 100 mg via ORAL
  Filled 2017-02-25 (×2): qty 2

## 2017-02-25 MED ORDER — ACETAMINOPHEN 325 MG PO TABS
650.0000 mg | ORAL_TABLET | Freq: Four times a day (QID) | ORAL | Status: DC | PRN
  Administered 2017-02-26: 10:00:00 650 mg via ORAL
  Filled 2017-02-25: qty 2

## 2017-02-25 MED ORDER — LABETALOL HCL 200 MG PO TABS
100.0000 mg | ORAL_TABLET | Freq: Every day | ORAL | Status: DC
  Administered 2017-02-26: 100 mg via ORAL
  Filled 2017-02-25: qty 1

## 2017-02-25 MED ORDER — ASPIRIN 81 MG PO CHEW
324.0000 mg | CHEWABLE_TABLET | Freq: Once | ORAL | Status: DC
  Filled 2017-02-25: qty 4

## 2017-02-25 MED ORDER — HYDRALAZINE HCL 20 MG/ML IJ SOLN: 10.0000 mg | Freq: Four times a day (QID) | INTRAMUSCULAR | Status: DC | PRN

## 2017-02-25 MED ORDER — OXYCODONE HCL 5 MG PO TABS
5.0000 mg | ORAL_TABLET | ORAL | Status: DC | PRN
Start: 2017-02-25 — End: 2017-02-26

## 2017-02-25 MED ORDER — ALLOPURINOL 100 MG PO TABS
100.0000 mg | ORAL_TABLET | Freq: Every day | ORAL | Status: DC
  Administered 2017-02-26: 08:00:00 100 mg via ORAL
  Filled 2017-02-25: qty 1

## 2017-02-25 NOTE — ED Provider Notes (Signed)
[facility] Emergency Department Provider Note   ____________________________________________    I have reviewed the triage vital signs and the nursing notes.   HISTORY  Chief Complaint Tremors     HPI Elaine Norris is a 71 y.o. female who presents with complaints of tremors and weakness. Patient reports she woke up this morning both of her legs felt quite weak and she was unable to ambulate and her son had to help her back to bed. She attributed this to taking gabapentin. Additionally it was noted that her voice "sounded funny". Daughter spoke to the patient around 10 AM and the patient had difficulty with conversation at that time. No history of similar symptoms. No fevers or chills. No arm or leg weakness noted. Patient also reports difficulty swallowing since she woke up this morning   Past Medical History:  Diagnosis Date  . Cancer (HCC)    cervical  . Hyperlipidemia   . Hypertension     Patient Active Problem List   Diagnosis Date Noted  . Chest pain 11/12/2012  . Hypertension   . Hyperlipidemia     Past Surgical History:  Procedure Laterality Date  . CARDIAC CATHETERIZATION  05/2011   [facility]  . [facility]  . CARD[facility]IZATION  2012   [facility]  . COLONOSCOPY    . MULTIPLE TOOTH EXTRACTIONS      Prior to Admission medications   Medication Sig Start Date End Date Taking? Authorizing Provider  acetaminophen (TYLENOL) 500 MG tablet Take 500 mg by mouth every 6 (six) hours as needed.   Yes Historical Provider, MD  alendronate (FOSAMAX) 70 MG tablet Take 1 tablet by mouth once a week. 01/03/17  Yes Historical Provider, MD  allopurinol (ZYLOPRIM) 100 MG tablet Take 1 tablet by mouth daily. 01/05/17  Yes Historical Provider, MD  aspirin 81 MG tablet Take 81 mg by mouth daily.   Yes Historical Provider, MD  atorvastatin (LIPITOR) 40 MG tablet Take 1 tablet by mouth daily. 12/15/16  Yes Historical Provider, MD   baclofen (LIORESAL) 10 MG tablet Take 5 mg by mouth daily.  02/04/17  Yes Historical Provider, MD  Calcium Carb-Cholecalciferol (CALCIUM CARBONATE-VITAMIN D3 PO) Take 1 tablet by mouth 2 (two) times daily.   Yes Historical Provider, MD  gabapentin (NEURONTIN) 300 MG capsule Take 1 capsule by mouth 3 (three) times daily. 02/20/17  Yes Historical Provider, MD  hydrALAZINE (APRESOLINE) 100 MG tablet Take 1 tablet by mouth 2 (two) times daily.  12/03/16  Yes Historical Provider, MD  labetalol (NORMODYNE) 100 MG tablet Take 100 mg by mouth daily.  01/07/17  Yes Historical Provider, MD  losartan-hydrochlorothiazide (HYZAAR) 100-12.5 MG tablet Take 1 tablet by mouth daily. 02/02/17  Yes Historical Provider, MD  meloxicam (MOBIC) 15 MG tablet Take 1 tablet by mouth daily. 02/04/17  Yes Historical Provider, MD  potassium chloride (K-DUR) 10 MEQ tablet Take 1 tablet by mouth 3 (three) times a week. Take 1 tab 3 times a week: mon, wed, friday 11/03/16  Yes Historical Provider, MD  predniSONE (STERAPRED UNI-PAK 21 TAB) 10 MG (21) TBPK tablet Take 1 tablet (10 mg total) by mouth daily. Take 6 tablets the first day, take 5 tablets the second day, take 4 tablets the third day, take 3 tablets the fourth day, take 2 tablets the fifth day, take 1 tablet the sixth day. 12/26/16   Lannie Fields, PA-C     Allergies Albuterol and Codeine  Family History  Problem Relation Age of Onset  . Heart disease Mother   . Heart failure Father     Social History Social History  Substance Use Topics  . Smoking status: Never Smoker  . Smokeless tobacco: Never Used  . Alcohol use No    Review of Systems  Constitutional: No fever/chills Eyes: No visual changes.  ENT: Difficulty swallowing as above Cardiovascular: Denies chest pain. Respiratory: Denies shortness of breath. Gastrointestinal: No abdominal pain.  No nausea, no vomiting.   Genitourinary: Negative for dysuria. Musculoskeletal: Negative for back pain. Skin:  Negative for rash. Neurological: As above  10-point ROS otherwise negative.  ____________________________________________   PHYSICAL EXAM:  VITAL SIGNS: ED Triage Vitals  Enc Vitals Group     BP 02/25/17 1224 (!) 145/83     Pulse Rate 02/25/17 1224 83     Resp 02/25/17 1224 18     Temp 02/25/17 1224 98.9 F (37.2 C)     Temp Source 02/25/17 1224 Oral     SpO2 02/25/17 1224 95 %     Weight 02/25/17 1225 195 lb (88.5 kg)     Height 02/25/17 1225 5\' 5"  (1.651 m)     Head Circumference --      Peak Flow --      Pain Score 02/25/17 1223 6     Pain Loc --      Pain Edu? --      Excl. in [place]? --     Constitutional: Alert and oriented. No acute distress. Pleasant and interactive Eyes: Conjunctivae are normal. EOMI, PERRLA  Nose: No congestion/rhinnorhea. Mouth/Throat: Mucous membranes are moist.    Cardiovascular: Normal rate, regular rhythm. Grossly normal heart sounds.  Good peripheral circulation. Respiratory: Normal respiratory effort.  No retractions. Lungs CTAB. Gastrointestinal: Soft and nontender. No distention.  No CVA tenderness. Genitourinary: deferred Musculoskeletal:   Warm and well perfused Neurologic:  Stuttering speech and possibly some word finding difficulty. No extremity weakness noted. Cranial nerves appear normal Skin:  Skin is warm, dry and intact. No rash noted. Psychiatric: Mood and affect are normal. Speech and behavior are normal.  ____________________________________________   LABS (all labs ordered are listed, but only abnormal results are displayed)  Labs Reviewed  CBC - Abnormal; Notable for the following:       Result Value   RDW 15.0 (*)    All other components within normal limits  COMPREHENSIVE METABOLIC PANEL - Abnormal; Notable for the following:    Glucose, Bld 175 (*)    Creatinine, Ser 1.20 (*)    Calcium 8.8 (*)    GFR calc non Af Amer 44 (*)    GFR calc Af Amer 51 (*)    All other components within normal limits    ____________________________________________  EKG  ED ECG REPORT I, Lavonia Drafts, the attending physician, personally viewed and interpreted this ECG.  Date: 02/25/2017  Rate: 67 Rhythm: normal sinus rhythm QRS Axis: normal Intervals: normal ST/T Wave abnormalities: normal Conduction Disturbances: none Narrative Interpretation: unremarkable _____________________________________  RADIOLOGY  CT head unremarkable ____________________________________________   PROCEDURES  Procedure(s) performed: No    Critical Care performed: No ____________________________________________   INITIAL IMPRESSION / ASSESSMENT AND PLAN / ED COURSE  Pertinent labs & imaging results that were available during my care of the patient were reviewed by me and considered in my medical decision making (see chart for details).  Patient presents with symptoms that are unusual. Primarily she is concerned about vague tremors primarily in  her right upper extremity. However she also appears to have difficulty speaking. I'm concerned that she had a stroke overnight, given her onset of symptoms when she got out of bed this morning CT head is reassuring. She is not within the window for TPA also NIHSS is 1-2. Will admit for further evaluation. ASA given    ____________________________________________   FINAL CLINICAL IMPRESSION(S) / ED DIAGNOSES  Final diagnoses:  Cerebrovascular accident (CVA), unspecified mechanism ([place])      NEW MEDICATIONS STARTED DURING THIS VISIT:  New Prescriptions   No medications on file     Note:  This document was prepared using Dragon voice recognition software and may include unintentional dictation errors.    Lavonia Drafts, MD 02/25/17 1515

## 2017-02-25 NOTE — ED Notes (Addendum)
Pt ambulatory to bathroom with one assist without difficulty.

## 2017-02-25 NOTE — ED Notes (Signed)
Hooked pt up to monitor.

## 2017-02-25 NOTE — ED Notes (Signed)
Helped pt to bathroom. 

## 2017-02-25 NOTE — ED Triage Notes (Signed)
Pt in via EMS from home with complaints of sudden onset tremors of the upper body.  Pt with hx of same with reaction to albuterol.  Pt reports only new medication is gabapentin started 4 days ago.  Pt A/Ox4, vitals WDL.  MD notified of pt condition.

## 2017-02-25 NOTE — ED Notes (Signed)
Dr. Benjie Karvonen notified of failed swallow screen.

## 2017-02-25 NOTE — Progress Notes (Signed)
Nurse reports patient did not pass her swallow evaluation due to her jerking. I called Elaine Norris however see patient today I will order MRI. Again very unlikely this is TIA/CVA

## 2017-02-25 NOTE — ED Notes (Signed)
Patient transported to CT 

## 2017-02-25 NOTE — ED Notes (Signed)
Pt ambulatory to toilet with one assist without difficulty.

## 2017-02-25 NOTE — ED Notes (Signed)
Hospitalist to bedside at this time 

## 2017-02-25 NOTE — H&P (Signed)
[place] [facility] [place] NAME: Elaine Norris    MR#:  [id number]  DATE OF BIRTH:  03-Sep-1946  DATE OF ADMISSION:  02/25/2017  PRIMARY CARE PHYSICIAN: Perrin Maltese, MD   REQUESTING/REFERRING PHYSICIAN: dr Corky Downs  CHIEF COMPLAINT:   jerking HISTORY OF PRESENT ILLNESS:  Elaine Norris  is a 71 y.o. female with a known history of Essential hypertension, cervical radiculopathy and hyperlipidemia who presents above complaint. Patient reports about a week ago she was started on Neurontin for cervical radiculopathy. This morning at approximate 5 AM she felt weak and she must call to the floor but the cough for her son who was able to lift her to the bed. She woke up around 8:30 and was in her usual state of health. She was ambulating well. She reports that a proximal May 10 AM she was sitting down and started having jerking episodes of her head and noticed that her speech when she was jerking her head had change. She reports that when her head is not jerking she does not have the symptoms. She comes to the ER for these symptoms. She denies any other focal neurological deficits. Hospitalist was consulted for concern of possible CVA. CT the head is negative for ICH or CVA. PAST MEDICAL HISTORY:   Past Medical History:  Diagnosis Date  . Cancer (HCC)    cervical  . Hyperlipidemia   . Hypertension     PAST SURGICAL HISTORY:   Past Surgical History:  Procedure Laterality Date  . CARDIAC CATHETERIZATION  05/2011   [facility]  . [facility]  . [facility]IZATION  2012   [facility]  . COLONOSCOPY    . MULTIPLE TOOTH EXTRACTIONS      SOCIAL HISTORY:   Social History  Substance Use Topics  . Smoking status: Never Smoker  . Smokeless tobacco: Never Used  . Alcohol use No    FAMILY HISTORY:   Family History  Problem Relation Age of Onset  . Heart disease Mother   . Heart failure Father     DRUG ALLERGIES:    Allergies  Allergen Reactions  . Albuterol Other (See Comments)    Pt reports seizures   . Codeine Other (See Comments)    Altered mental status     REVIEW OF SYSTEMS:   Review of Systems  Constitutional: Negative.  Negative for chills, fever and malaise/fatigue.  HENT: Negative.  Negative for ear discharge, ear pain, hearing loss, nosebleeds and sore throat.   Eyes: Negative.  Negative for blurred vision and pain.  Respiratory: Negative.  Negative for cough, hemoptysis, shortness of breath and wheezing.   Cardiovascular: Negative.  Negative for chest pain, palpitations and leg swelling.  Gastrointestinal: Negative.  Negative for abdominal pain, blood in stool, diarrhea, nausea and vomiting.  Genitourinary: Negative.  Negative for dysuria.  Musculoskeletal: Negative.  Negative for back pain.  Skin: Negative.   Neurological: Positive for speech change (with jerking of the head). Negative for dizziness, tremors, focal weakness, seizures and headaches.       Jerking of head  Endo/Heme/Allergies: Negative.  Does not bruise/bleed easily.  Psychiatric/Behavioral: Negative.  Negative for depression, hallucinations and suicidal ideas.    MEDICATIONS AT HOME:   Prior to Admission medications   Medication Sig Start Date End Date Taking? Authorizing Provider  acetaminophen (TYLENOL) 500 MG tablet Take 500 mg by mouth every 6 (six) hours as needed.   Yes Historical Provider, MD  alendronate (FOSAMAX) 70 MG tablet Take 1 tablet by mouth once a week. 01/03/17  Yes Historical Provider, MD  allopurinol (ZYLOPRIM) 100 MG tablet Take 1 tablet by mouth daily. 01/05/17  Yes Historical Provider, MD  aspirin 81 MG tablet Take 81 mg by mouth daily.   Yes Historical Provider, MD  atorvastatin (LIPITOR) 40 MG tablet Take 1 tablet by mouth daily. 12/15/16  Yes Historical Provider, MD  baclofen (LIORESAL) 10 MG tablet Take 5 mg by mouth daily.  02/04/17  Yes Historical Provider, MD  Calcium  Carb-Cholecalciferol (CALCIUM CARBONATE-VITAMIN D3 PO) Take 1 tablet by mouth 2 (two) times daily.   Yes Historical Provider, MD  gabapentin (NEURONTIN) 300 MG capsule Take 1 capsule by mouth 3 (three) times daily. 02/20/17  Yes Historical Provider, MD  hydrALAZINE (APRESOLINE) 100 MG tablet Take 1 tablet by mouth 2 (two) times daily.  12/03/16  Yes Historical Provider, MD  labetalol (NORMODYNE) 100 MG tablet Take 100 mg by mouth daily.  01/07/17  Yes Historical Provider, MD  losartan-hydrochlorothiazide (HYZAAR) 100-12.5 MG tablet Take 1 tablet by mouth daily. 02/02/17  Yes Historical Provider, MD  meloxicam (MOBIC) 15 MG tablet Take 1 tablet by mouth daily. 02/04/17  Yes Historical Provider, MD  potassium chloride (K-DUR) 10 MEQ tablet Take 1 tablet by mouth 3 (three) times a week. Take 1 tab 3 times a week: mon, wed, friday 11/03/16  Yes Historical Provider, MD  predniSONE (STERAPRED UNI-PAK 21 TAB) 10 MG (21) TBPK tablet Take 1 tablet (10 mg total) by mouth daily. Take 6 tablets the first day, take 5 tablets the second day, take 4 tablets the third day, take 3 tablets the fourth day, take 2 tablets the fifth day, take 1 tablet the sixth day. 12/26/16   Lannie Fields, PA-C      VITAL SIGNS:  Blood pressure (!) 172/70, pulse 66, temperature 98.9 F (37.2 C), temperature source Oral, resp. rate 20, height 5\' 5"  (1.651 m), weight 88.5 kg (195 lb), SpO2 98 %.  PHYSICAL EXAMINATION:   Physical Exam  Constitutional: She is oriented to person, place, and time and well-developed, well-nourished, and in no distress. No distress.  HENT:  Head: Normocephalic.  Eyes: No scleral icterus.  Neck: Normal range of motion. Neck supple. No JVD present. No tracheal deviation present.  Cardiovascular: Normal rate, regular rhythm and normal heart sounds.  Exam reveals no gallop and no friction rub.   No murmur heard. Pulmonary/Chest: Effort normal and breath sounds normal. No respiratory distress. She has no  wheezes. She has no rales. She exhibits no tenderness.  Abdominal: Soft. Bowel sounds are normal. She exhibits no distension and no mass. There is no tenderness. There is no rebound and no guarding.  Musculoskeletal: Normal range of motion. She exhibits no edema.  Neurological: She is alert and oriented to person, place, and time. She displays normal reflexes. No cranial nerve deficit. She exhibits normal muscle tone. Coordination normal.  She has jerking of the head however when I and doing a physical examination and distract the patient the jerking resolves.  Skin: Skin is warm. No rash noted. No erythema.  Psychiatric: Affect and judgment normal.      LABORATORY PANEL:   CBC  Recent Labs Lab 02/25/17 1231  WBC 6.6  HGB 12.1  HCT 35.6  PLT 170   ------------------------------------------------------------------------------------------------------------------  Chemistries   Recent Labs Lab 02/25/17 1231  NA 137  K 3.8  CL 102  CO2 28  GLUCOSE 175*  BUN 17  CREATININE 1.20*  CALCIUM 8.8*  AST 26  ALT 26  ALKPHOS 51  BILITOT 0.7   ------------------------------------------------------------------------------------------------------------------  Cardiac Enzymes No results for input(s): TROPONINI in the last 168 hours. ------------------------------------------------------------------------------------------------------------------  RADIOLOGY:  Ct Head Wo Contrast  Result Date: 02/25/2017 CLINICAL DATA:  Acute onset tremors of the upper body today. EXAM: CT HEAD WITHOUT CONTRAST TECHNIQUE: Contiguous axial images were obtained from the base of the skull through the vertex without intravenous contrast. COMPARISON:  Brain MRI scan 10/16/2011.  Head CT scan 10/15/2011. FINDINGS: Brain: Appears normal without hemorrhage, infarct, mass lesion, mass effect, midline shift or abnormal extra-axial fluid collection. No hydrocephalus or pneumocephalus. Vascular: A few  atherosclerotic calcifications are identified. Skull: Intact. Sinuses/Orbits: Negative. Other: None. IMPRESSION: No acute abnormality. Atherosclerosis. Electronically Signed   By: Inge Rise M.D.   On: 02/25/2017 13:40    EKG:    Normal sinus rhythm without ST elevation or depression IMPRESSION AND PLAN:   71 year old female with a history of cervical radiculopathy and chronic numbness of her right hand recently started on gabapentin presents with jerking of her head.  1. Head jerking without any other focal neurological deficits or symptoms. Her symptoms to me do not sound like TIA or CVA. Case curb sided with neurologist. I feel the gabapentin most likely cause he symptoms. Discontinue gabapentin and baclofen for now. Neuro checks every 4 hours. If her symptoms worsen or she has focal neurological deficits while off these medications then CVA workup can be initiated.  2. Cervical radiculopathy: Holding gabapentin and baclofen as stated above  3. Essential hypertension: Continue hydralazine, labetalol, Hyzaar  4. Hyponatremia: Continue statin    All the records are reviewed and case discussed with ED provider. Management plans discussed with the patient and she is in agreement  CODE STATUS: Full  TOTAL TIME TAKING CARE OF THIS PATIENT: 40 minutes.    Donney Caraveo M.D on 02/25/2017 at 3:33 PM  Between 7am to 6pm - Pager - [phone]  After 6pm go to [email or web address] - password [email or web address] [facility]  Office  [phone]  CC: Primary care physician; Perrin Maltese, MD

## 2017-02-26 LAB — BASIC METABOLIC PANEL
Anion gap: 6 (ref 5–15)
BUN: 17 mg/dL (ref 6–20)
CALCIUM: 9 mg/dL (ref 8.9–10.3)
CO2: 30 mmol/L (ref 22–32)
CREATININE: 1.13 mg/dL — AB (ref 0.44–1.00)
Chloride: 105 mmol/L (ref 101–111)
GFR calc non Af Amer: 48 mL/min — ABNORMAL LOW (ref >60)
GFR, EST AFRICAN AMERICAN: 55 mL/min — AB (ref >60)
Glucose, Bld: 101 mg/dL — ABNORMAL HIGH (ref 65–99)
Potassium: 3.8 mmol/L (ref 3.5–5.1)
Sodium: 141 mmol/L (ref 135–145)

## 2017-02-26 LAB — CBC
HCT: 34.3 % — ABNORMAL LOW (ref 35.0–47.0)
Hemoglobin: 11.6 g/dL — ABNORMAL LOW (ref 12.0–16.0)
MCH: 31.4 pg (ref 26.0–34.0)
MCHC: 33.8 g/dL (ref 32.0–36.0)
MCV: 92.8 fL (ref 80.0–100.0)
Platelets: 168 10*3/uL (ref 150–440)
RBC: 3.69 MIL/uL — ABNORMAL LOW (ref 3.80–5.20)
RDW: 15.4 % — ABNORMAL HIGH (ref 11.5–14.5)
WBC: 5.4 10*3/uL (ref 3.6–11.0)

## 2017-02-26 NOTE — Discharge Summary (Signed)
[facility] NAME: Elaine Norris    MR#:  [id number]  DATE OF BIRTH:  1946/10/17  DATE OF ADMISSION:  02/25/2017 ADMITTING PHYSICIAN: Bettey Costa, MD  DATE OF DISCHARGE: 02/26/2017  PRIMARY CARE PHYSICIAN: Perrin Maltese, MD    ADMISSION DIAGNOSIS:  Jerking [R25.3] Cerebrovascular accident (CVA), unspecified mechanism ([place]) [I63.9]  DISCHARGE DIAGNOSIS:  Active Problems:   Jerking   Side effect of gabapentine and beclofen  SECONDARY DIAGNOSIS:   Past Medical History:  Diagnosis Date  . Cancer (HCC)    cervical  . Hyperlipidemia   . Hypertension     HOSPITAL COURSE:   71 year old female with a history of cervical radiculopathy and chronic numbness of her right hand recently started on gabapentin presents with jerking of her head.  1. Head jerking without any other focal neurological deficits or symptoms. Her symptoms to me do not sound like TIA or CVA. Case curb sided with neurologist. gabapentin most likely cause he symptoms. Discontinue gabapentin and baclofen for now. Neuro checks every 4 hours. Next day morning, she is completely back to baseline and denies any problems. Advised to stop these 2 medicines- and follow with her PMD for alternatives, if radiculopathy persists.  2. Cervical radiculopathy: Holding gabapentin and baclofen as stated above  3. Essential hypertension: Continue hydralazine, labetalol, Hyzaar  4. Hyponatremia: Continue statin  DISCHARGE CONDITIONS:   Stable.  CONSULTS OBTAINED:    DRUG ALLERGIES:   Allergies  Allergen Reactions  . Albuterol Other (See Comments)    Pt reports seizures   . Codeine Other (See Comments)    Altered mental status     DISCHARGE MEDICATIONS:   Current Discharge Medication List    CONTINUE these medications which have NOT CHANGED   Details  acetaminophen (TYLENOL) 500 MG tablet Take 500 mg by mouth every 6 (six) hours as needed.     alendronate (FOSAMAX) 70 MG tablet Take 1 tablet by mouth once a week.    allopurinol (ZYLOPRIM) 100 MG tablet Take 1 tablet by mouth daily.    aspirin 81 MG tablet Take 81 mg by mouth daily.    atorvastatin (LIPITOR) 40 MG tablet Take 1 tablet by mouth daily.    Calcium Carb-Cholecalciferol (CALCIUM CARBONATE-VITAMIN D3 PO) Take 1 tablet by mouth 2 (two) times daily.    hydrALAZINE (APRESOLINE) 100 MG tablet Take 1 tablet by mouth 2 (two) times daily.  Refills: 6    labetalol (NORMODYNE) 100 MG tablet Take 100 mg by mouth daily.  Refills: 2    losartan-hydrochlorothiazide (HYZAAR) 100-12.5 MG tablet Take 1 tablet by mouth daily.    meloxicam (MOBIC) 15 MG tablet Take 1 tablet by mouth daily. Refills: 3    potassium chloride (K-DUR) 10 MEQ tablet Take 1 tablet by mouth 3 (three) times a week. Take 1 tab 3 times a week: mon, wed, friday    predniSONE (STERAPRED UNI-PAK 21 TAB) 10 MG (21) TBPK tablet Take 1 tablet (10 mg total) by mouth daily. Take 6 tablets the first day, take 5 tablets the second day, take 4 tablets the third day, take 3 tablets the fourth day, take 2 tablets the fifth day, take 1 tablet the sixth day. Qty: 21 tablet, Refills: 0      STOP taking these medications     baclofen (LIORESAL) 10 MG tablet      gabapentin (NEURONTIN) 300 MG capsule          DISCHARGE INSTRUCTIONS:  Follow with PMD in 1 week.  If you experience worsening of your admission symptoms, develop shortness of breath, life threatening emergency, suicidal or homicidal thoughts you must seek medical attention immediately by calling 911 or calling your MD immediately  if symptoms less severe.  You Must read complete instructions/literature along with all the possible adverse reactions/side effects for all the Medicines you take and that have been prescribed to you. Take any new Medicines after you have completely understood and accept all the possible adverse reactions/side effects.    Please note  You were cared for by a hospitalist during your hospital stay. If you have any questions about your discharge medications or the care you received while you were in the hospital after you are discharged, you can call the unit and asked to speak with the hospitalist on call if the hospitalist that took care of you is not available. Once you are discharged, your primary care physician will handle any further medical issues. Please note that NO REFILLS for any discharge medications will be authorized once you are discharged, as it is imperative that you return to your primary care physician (or establish a relationship with a primary care physician if you do not have one) for your aftercare needs so that they can reassess your need for medications and monitor your lab values.    Today   CHIEF COMPLAINT:   Chief Complaint  Patient presents with  . Tremors    HISTORY OF PRESENT ILLNESS:  Elaine Norris  is a 71 y.o. female with a known history of Essential hypertension, cervical radiculopathy and hyperlipidemia who presents above complaint. Patient reports about a week ago she was started on Neurontin for cervical radiculopathy. This morning at approximate 5 AM she felt weak and she must call to the floor but the cough for her son who was able to lift her to the bed. She woke up around 8:30 and was in her usual state of health. She was ambulating well. She reports that a proximal May 10 AM she was sitting down and started having jerking episodes of her head and noticed that her speech when she was jerking her head had change. She reports that when her head is not jerking she does not have the symptoms. She comes to the ER for these symptoms. She denies any other focal neurological deficits. Hospitalist was consulted for concern of possible CVA. CT the head is negative for ICH or CVA.   VITAL SIGNS:  Blood pressure (!) 160/67, pulse 76, temperature 98.7 F (37.1 C), temperature  source Oral, resp. rate 19, height 5\' 5"  (1.651 m), weight 86.8 kg (191 lb 6.4 oz), SpO2 97 %.  I/O:   Intake/Output Summary (Last 24 hours) at 02/26/17 0857 Last data filed at 02/26/17 0532  [place] per 24 hour  Intake              940 ml  Output                0 ml  Net              940 ml    PHYSICAL EXAMINATION:  GENERAL:  72 y.o.-year-old patient lying in the bed with no acute distress.  EYES: Pupils equal, round, reactive to light and accommodation. No scleral icterus. Extraocular muscles intact.  HEENT: Head atraumatic, normocephalic. Oropharynx and nasopharynx clear.  NECK:  Supple, no jugular venous distention. No thyroid enlargement, no tenderness.  LUNGS: Normal breath sounds bilaterally, no  wheezing, rales,rhonchi or crepitation. No use of accessory muscles of respiration.  CARDIOVASCULAR: S1, S2 normal. No murmurs, rubs, or gallops.  ABDOMEN: Soft, non-tender, non-distended. Bowel sounds present. No organomegaly or mass.  EXTREMITIES: No pedal edema, cyanosis, or clubbing.  NEUROLOGIC: Cranial nerves II through XII are intact. Muscle strength 5/5 in all extremities. Sensation intact. Gait not checked.  PSYCHIATRIC: The patient is alert and oriented x 3.  SKIN: No obvious rash, lesion, or ulcer.   DATA REVIEW:   CBC  Recent Labs Lab 02/26/17 0356  WBC 5.4  HGB 11.6*  HCT 34.3*  PLT 168    Chemistries   Recent Labs Lab 02/25/17 1231 02/26/17 0356  NA 137 141  K 3.8 3.8  CL 102 105  CO2 28 30  GLUCOSE 175* 101*  BUN 17 17  CREATININE 1.20* 1.13*  CALCIUM 8.8* 9.0  AST 26  --   ALT 26  --   ALKPHOS 51  --   BILITOT 0.7  --     Cardiac Enzymes No results for input(s): TROPONINI in the last 168 hours.  Microbiology Results  No results found for this or any previous visit.  RADIOLOGY:  Ct Head Wo Contrast  Result Date: 02/25/2017 CLINICAL DATA:  Acute onset tremors of the upper body today. EXAM: CT HEAD WITHOUT CONTRAST TECHNIQUE: Contiguous  axial images were obtained from the base of the skull through the vertex without intravenous contrast. COMPARISON:  Brain MRI scan 10/16/2011.  Head CT scan 10/15/2011. FINDINGS: Brain: Appears normal without hemorrhage, infarct, mass lesion, mass effect, midline shift or abnormal extra-axial fluid collection. No hydrocephalus or pneumocephalus. Vascular: A few atherosclerotic calcifications are identified. Skull: Intact. Sinuses/Orbits: Negative. Other: None. IMPRESSION: No acute abnormality. Atherosclerosis. Electronically Signed   By: Inge Rise M.D.   On: 02/25/2017 13:40    EKG:   Orders placed or performed during the hospital encounter of 02/25/17  . ED EKG  . ED EKG  . EKG 12-Lead  . EKG 12-Lead  . EKG 12-Lead  . EKG 12-Lead      Management plans discussed with the patient, family and they are in agreement.  CODE STATUS:     Code Status Orders        Start     Ordered   02/25/17 1747  Full code  Continuous     02/25/17 1747    Code Status History    Date Active Date Inactive Code Status Order ID Comments User Context   This patient has a current code status but no historical code status.    Advance Directive Documentation     Most Recent Value  Type of Advance Directive  Healthcare Power of Attorney, Living will  Pre-existing out of facility DNR order (yellow form or pink MOST form)  -  "MOST" Form in Place?  -      TOTAL TIME TAKING CARE OF THIS PATIENT: 35 minutes.    Vaughan Basta M.D on 02/26/2017 at 8:57 AM  Between 7am to 6pm - Pager - [phone]  After 6pm go to [email or web address] - password [email or web address] [email or web address]  Office  [phone]  CC: Primary care physician; Perrin Maltese, MD   Note: This dictation was prepared with Dragon dictation along with smaller phrase technology. Any transcriptional errors that result from this process are unintentional.

## 2017-02-26 NOTE — Progress Notes (Signed)
Nurse tech notified nurse early this morning that blood pressure was elevated systolically in the 937'D Rechecked manually and was 182/98. IV Hydralazine given per PRN order for systolic b/p result above 180. Manually rechecked and blood pressure is now 172/78. Will continue to monitor patient to end of shift. Patient complains of no pain or discomfort at this time.

## 2017-02-26 NOTE — Care Management Obs Status (Signed)
[facility] NOTIFICATION   Patient Details  Name: ENOLA SIEBERS MRN: [id number] Date of Birth: 1946/08/16   Medicare Observation Status Notification Given:  Yes    Shelbie Ammons, RN 02/26/2017, 10:09 AM

## 2017-02-26 NOTE — Evaluation (Signed)
Clinical/Bedside Swallow Evaluation Patient Details  Name: Elaine Norris MRN: [id number] Date of Birth: January 17, 1946  Today's Date: 02/26/2017 Time: SLP Start Time (ACUTE ONLY): 0840 SLP Stop Time (ACUTE ONLY): 0940 SLP Time Calculation (min) (ACUTE ONLY): 60 min  Past Medical History:  Past Medical History:  Diagnosis Date  . Cancer (HCC)    cervical  . Hyperlipidemia   . Hypertension    Past Surgical History:  Past Surgical History:  Procedure Laterality Date  . CARDIAC CATHETERIZATION  05/2011   [facility]  . CARD[facility]AC CAT[facility]IZATION  09/2011   [facility]  . CARDIAC CATHETERIZATION  2012   [facility]  . COLONOSCOPY    . MULTIPLE TOOTH EXTRACTIONS     HPI:  Pt is a 71 y.o. female with a known history of Essential hypertension, cervical radiculopathy and hyperlipidemia who presents above complaint. Patient reports about a week ago she was started on Neurontin for cervical radiculopathy. This morning at approximate 5 AM she felt weak and she must call to the floor but the cough for her son who was able to lift her to the bed. She woke up around 8:30 and was in her usual state of health. She was ambulating well. She reports that a proximal May 10 AM she was sitting down and started having jerking episodes of her head and noticed that her speech when she was jerking her head had change. She reports that when her head is not jerking she does not have the symptoms. She comes to the ER for these symptoms. She denies any other focal neurological deficits. Hospitalist was consulted for concern of possible CVA. Currently, pt exhibits no jerking movements and is able to verbalize w/ SLP easily. She described that her jerking symptoms resolved last night and thought that it was d/t a medication she had started taking. Pt A/O x4. Pt does c/o some difficulty swallowing pills.   Assessment / Plan / Recommendation Clinical Impression  Pt appears to adequately tolerate trials of thin liquids and soft solids w/ no  overt s/s of aspiration noted; no change in vocal quality or decline in respiratory status. Oral phase appeared wfl for bolus management for swallowing/clearing. Pt endorsed difficulty swallowing "some" solids and Pills "sometimes in the morning" - before eating her meal. Discussed the bulkiness and solid texture of such things and the need to moisten the mouth/throat to aid clearing as Pills can stick d/t their coating; meats and breads are bulky as they travel the Esophagus. Instructed pt to alternate foods w/ liquids(small, single sip) or w/ other moist foods and slow down when eating. Instructed pt to moisten mouth first w/ sips of water b/f swallowing Pills or swallow Pills WHOLE in Applesauce. Pt appears at her baseline for toleration of diet following general aspiration precautions.  SLP Visit Diagnosis: Dysphagia, unspecified (R13.10)    Aspiration Risk   (reduced)    Diet Recommendation  Regular diet w/ thin liquids; general aspiration precautions  Medication Administration: Whole meds with puree (as needed for easier swallowing)    Other  Recommendations Oral Care Recommendations: Oral care BID;Patient independent with oral care   Follow up Recommendations None      Frequency and Duration            Prognosis Prognosis for Safe Diet Advancement: Good      Swallow Study   General Date of Onset: 02/25/17 HPI: Pt is a 71 y.o. female with a known history of Essential hypertension, cervical radiculopathy and hyperlipidemia who presents above  complaint. Patient reports about a week ago she was started on Neurontin for cervical radiculopathy. This morning at approximate 5 AM she felt weak and she must call to the floor but the cough for her son who was able to lift her to the bed. She woke up around 8:30 and was in her usual state of health. She was ambulating well. She reports that a proximal May 10 AM she was sitting down and started having jerking episodes of her head and noticed that  her speech when she was jerking her head had change. She reports that when her head is not jerking she does not have the symptoms. She comes to the ER for these symptoms. She denies any other focal neurological deficits. Hospitalist was consulted for concern of possible CVA. Currently, pt exhibits no jerking movements and is able to verbalize w/ SLP easily. She described that her jerking symptoms resolved last night and thought that it was d/t a medication she had started taking. Pt A/O x4. Pt does c/o some difficulty swallowing pills. Type of Study: Bedside Swallow Evaluation Previous Swallow Assessment: none Diet Prior to this Study: Regular;Thin liquids (home) Temperature Spikes Noted: No Respiratory Status: Room air History of Recent Intubation: No Behavior/Cognition: Alert;Cooperative;Pleasant mood Oral Cavity Assessment: Within Functional Limits Oral Care Completed by SLP: Recent completion by staff Oral Cavity - Dentition: Adequate natural dentition Vision: Functional for self-feeding Self-Feeding Abilities: Able to feed self Patient Positioning: Upright in bed Baseline Vocal Quality: Normal (min quivery intermittently) Volitional Cough: Strong Volitional Swallow: Able to elicit    Oral/Motor/Sensory Function Overall Oral Motor/Sensory Function: Within functional limits   Ice Chips Ice chips: Not tested   Thin Liquid Thin Liquid: Within functional limits Presentation: Straw;Self Fed (4-5 swallows; 4 ozs of juice)    Nectar Thick Nectar Thick Liquid: Not tested   Honey Thick Honey Thick Liquid: Not tested   Puree Puree: Within functional limits Presentation: Self Fed;Spoon (2 trials)   Solid   GO   Solid: Within functional limits Presentation: Self Fed (muffin bites) Other Comments: pt stated she felt the solid foods were "a little tough sometimes"    Functional Assessment Tool Used: clinical judgement Functional Limitations: Swallowing Swallow Current Status ([id number]): At  least 1 percent but less than 20 percent impaired, limited or restricted Swallow Goal Status [phone]): At least 1 percent but less than 20 percent impaired, limited or restricted Swallow Discharge Status [phone]): At least 1 percent but less than 20 percent impaired, limited or restricted     Orinda Kenner, MS, CCC-SLP Kionte Baumgardner 02/26/2017,1:42 PM

## 2017-02-26 NOTE — Discharge Planning (Signed)
IV removed. Discharge papers given, explained and educated.  No scripts needed at this time. Informed of suggested FU appt and appt made.  RN assessment and VS revealed stability for discharge to home.  Patient waiting on ride and states they should arrive after 1400.  Once ride arrives, patient will be wheeled to front and family transporting via car.

## 2017-02-26 NOTE — Care Management (Signed)
Admitted to [facility] under observation status with the diagnosis of "Jerking". Lives with family in the home setting. Sees Dr. Humphrey Rolls as primary care physician.  Speech evaluation completed. No needs. States she has already arranged for outpatient physical therapy at [facility] therapy in [place]. Discharge to home later today per Dr. Anselm Jungling. Shelbie Ammons RN MSN CCM Care Management [phone]

## 2017-05-06 ENCOUNTER — Other Ambulatory Visit: Payer: Self-pay | Admitting: Internal Medicine

## 2017-05-06 DIAGNOSIS — Z1231 Encounter for screening mammogram for malignant neoplasm of breast: Secondary | ICD-10-CM

## 2017-05-21 ENCOUNTER — Ambulatory Visit
Admission: RE | Admit: 2017-05-21 | Discharge: 2017-05-21 | Disposition: A | Discharge status: Home / Self Care | Payer: Medicare Other | Source: Ambulatory Visit | Attending: Internal Medicine | Admitting: Internal Medicine

## 2017-05-21 DIAGNOSIS — Z1231 Encounter for screening mammogram for malignant neoplasm of breast: Secondary | ICD-10-CM | POA: Diagnosis not present

## 2017-06-20 ENCOUNTER — Other Ambulatory Visit: Payer: Self-pay

## 2017-06-20 ENCOUNTER — Emergency Department: Payer: Medicare Other

## 2017-06-20 ENCOUNTER — Emergency Department
Admission: EM | Admit: 2017-06-20 | Discharge: 2017-06-20 | Disposition: A | Discharge status: Home / Self Care | Payer: Medicare Other | Attending: Emergency Medicine | Admitting: Emergency Medicine

## 2017-06-20 DIAGNOSIS — I10 Essential (primary) hypertension: Secondary | ICD-10-CM | POA: Diagnosis not present

## 2017-06-20 DIAGNOSIS — R0600 Dyspnea, unspecified: Secondary | ICD-10-CM | POA: Insufficient documentation

## 2017-06-20 DIAGNOSIS — R0602 Shortness of breath: Secondary | ICD-10-CM | POA: Diagnosis present

## 2017-06-20 DIAGNOSIS — F419 Anxiety disorder, unspecified: Secondary | ICD-10-CM | POA: Diagnosis not present

## 2017-06-20 LAB — CBC WITH DIFFERENTIAL/PLATELET
BASOS ABS: 0 10*3/uL (ref 0–0.1)
BASOS PCT: 1 %
EOS ABS: 0.1 10*3/uL (ref 0–0.7)
Eosinophils Relative: 2 %
HCT: 36.9 % (ref 35.0–47.0)
HEMOGLOBIN: 12.5 g/dL (ref 12.0–16.0)
Lymphocytes Relative: 16 %
Lymphs Abs: 1.1 10*3/uL (ref 1.0–3.6)
MCH: 30 pg (ref 26.0–34.0)
MCHC: 33.8 g/dL (ref 32.0–36.0)
MCV: 88.6 fL (ref 80.0–100.0)
Monocytes Absolute: 0.4 10*3/uL (ref 0.2–0.9)
Monocytes Relative: 6 %
NEUTROS PCT: 75 %
Neutro Abs: 5.4 10*3/uL (ref 1.4–6.5)
Platelets: 191 10*3/uL (ref 150–440)
RBC: 4.16 MIL/uL (ref 3.80–5.20)
RDW: 14.4 % (ref 11.5–14.5)
WBC: 7.2 10*3/uL (ref 3.6–11.0)

## 2017-06-20 LAB — URINALYSIS, ROUTINE W REFLEX MICROSCOPIC
BILIRUBIN URINE: NEGATIVE
Glucose, UA: NEGATIVE mg/dL
Hgb urine dipstick: NEGATIVE
Ketones, ur: NEGATIVE mg/dL
Leukocytes, UA: NEGATIVE
NITRITE: NEGATIVE
PH: 7 (ref 5.0–8.0)
Protein, ur: NEGATIVE mg/dL
SPECIFIC GRAVITY, URINE: 1.005 (ref 1.005–1.030)

## 2017-06-20 LAB — COMPREHENSIVE METABOLIC PANEL
ALBUMIN: 4.2 g/dL (ref 3.5–5.0)
ALK PHOS: 77 U/L (ref 38–126)
ALT: 32 U/L (ref 14–54)
ANION GAP: 11 (ref 5–15)
AST: 34 U/L (ref 15–41)
BUN: 25 mg/dL — ABNORMAL HIGH (ref 6–20)
CALCIUM: 9.3 mg/dL (ref 8.9–10.3)
CO2: 26 mmol/L (ref 22–32)
Chloride: 103 mmol/L (ref 101–111)
Creatinine, Ser: 1.13 mg/dL — ABNORMAL HIGH (ref 0.44–1.00)
GFR calc Af Amer: 55 mL/min — ABNORMAL LOW (ref >60)
GFR calc non Af Amer: 48 mL/min — ABNORMAL LOW (ref >60)
GLUCOSE: 107 mg/dL — AB (ref 65–99)
Potassium: 3.5 mmol/L (ref 3.5–5.1)
SODIUM: 140 mmol/L (ref 135–145)
Total Bilirubin: 0.7 mg/dL (ref 0.3–1.2)
Total Protein: 7.3 g/dL (ref 6.5–8.1)

## 2017-06-20 LAB — TROPONIN I
TROPONIN I: 0.04 ng/mL — AB (ref <0.03)
Troponin I: 0.05 ng/mL (ref <0.03)

## 2017-06-20 LAB — BRAIN NATRIURETIC PEPTIDE: B Natriuretic Peptide: 54 pg/mL (ref 0.0–100.0)

## 2017-06-20 MED ORDER — VENLAFAXINE HCL ER 75 MG PO CP24: 150.0000 mg | ORAL_CAPSULE | Freq: Every day | ORAL | 2 refills | Status: DC

## 2017-06-20 MED ORDER — DIAZEPAM 5 MG PO TABS
5.0000 mg | ORAL_TABLET | Freq: Once | ORAL | Status: AC
  Administered 2017-06-20: 5 mg via ORAL
  Filled 2017-06-20: qty 1

## 2017-06-20 NOTE — ED Triage Notes (Signed)
Pt reports some increased SOB over the last couple of days. Pt reports non producitve cough. Denies fevers, NVD, other sx's. Just concerned about SOB.

## 2017-06-20 NOTE — ED Notes (Signed)
Pt states SOB that started today around "dinner time". Felt she couldn't breathe and it felt worse than other times. Family at bedside.

## 2017-06-20 NOTE — ED Notes (Signed)
Pt reports that her SOB is worse than before. Pts vitals assessed. Pt does not appear to be in any distress.

## 2017-06-20 NOTE — ED Provider Notes (Signed)
[facility] Emergency Department Provider Note       Time seen: ----------------------------------------- 3:46 PM on 06/20/2017 -----------------------------------------     I have reviewed the triage vital signs and the nursing notes.   HISTORY   Chief Complaint Shortness of Breath    HPI Elaine Norris is a 71 y.o. female who presents to the ED for worsening shortness of breath over the past couple of days. Patient reports nonproductive cough but denies any fevers, GI complaints or other symptoms. Patient's main complaint is shortness of breath. She denies other complaints at this time. Patient states an activity makes her short of breath one day does not seem to cause shortness of breath and next day. Patient does not think this could be related to stress or anxiety.   Past Medical History:  Diagnosis Date  . Cancer (HCC)    cervical  . Hyperlipidemia   . Hypertension     Patient Active Problem List   Diagnosis Date Noted  . Jerking 02/25/2017  . Chest pain 11/12/2012  . Hypertension   . Hyperlipidemia     Past Surgical History:  Procedure Laterality Date  . CARDIAC CATHETERIZATION  05/2011   [facility]  . C[facility]AC CAT[facility]IZATION  09/2011   [facility]  . CARDIAC CATHETERIZATION  2012   [facility]  . COLONOSCOPY    . MULTIPLE TOOTH EXTRACTIONS      Allergies Gabapentin; Albuterol; and Codeine  Social History Social History  Substance Use Topics  . Smoking status: Never Smoker  . Smokeless tobacco: Never Used  . Alcohol use No    Review of Systems Constitutional: Negative for fever. Eyes: Negative for vision changes ENT:  Negative for congestion, sore throat Cardiovascular: Negative for chest pain. Respiratory: Positive shortness of breath Gastrointestinal: Negative for abdominal pain, vomiting and diarrhea. Genitourinary: Negative for dysuria. Musculoskeletal: Negative for back pain. Skin: Negative for rash. Neurological: Negative  for headaches, focal weakness or numbness.  All systems negative/normal/unremarkable except as stated in the HPI  ____________________________________________   PHYSICAL EXAM:  VITAL SIGNS: ED Triage Vitals  Enc Vitals Group     BP 06/20/17 1509 (!) 161/79     Pulse Rate 06/20/17 1509 (!) 58     Resp 06/20/17 1509 18     Temp --      Temp src --      SpO2 06/20/17 1509 99 %     Weight 06/20/17 1407 186 lb (84.4 kg)     Height 06/20/17 1407 5\' 5"  (1.651 m)     Head Circumference --      Peak Flow --      Pain Score --      Pain Loc --      Pain Edu? --      Excl. in [place]? --     Constitutional: Alert and oriented. Well appearing and in no distress. Eyes: Conjunctivae are normal. Normal extraocular movements. ENT   Head: Normocephalic and atraumatic.   Nose: No congestion/rhinnorhea.   Mouth/Throat: Mucous membranes are moist.   Neck: No stridor. Cardiovascular: Normal rate, regular rhythm. No murmurs, rubs, or gallops. Respiratory: Normal respiratory effort without tachypnea nor retractions. Breath sounds are clear and equal bilaterally. No wheezes/rales/rhonchi. Gastrointestinal: Soft and nontender. Normal bowel sounds Musculoskeletal: Nontender with normal range of motion in extremities. No lower extremity tenderness nor edema. Neurologic:  Normal speech and language. No gross focal neurologic deficits are appreciated.  Skin:  Skin is warm, dry and intact. No rash noted.  Psychiatric: Mood and affect are normal. Speech and behavior are normal.  ____________________________________________  EKG: Interpreted by me. Sinus rhythm rate of 66 bpm, normal PR interval, normal QRS, normal QT.  ____________________________________________  ED COURSE:  Pertinent labs & imaging results that were available during my care of the patient were reviewed by me and considered in my medical decision making (see chart for details). Patient presents for dyspnea, we will assess  with labs and imaging as indicated.   Procedures ____________________________________________   LABS (pertinent positives/negatives)  Labs Reviewed  COMPREHENSIVE METABOLIC PANEL - Abnormal; Notable for the following:       Result Value   Glucose, Bld 107 (*)    BUN 25 (*)    Creatinine, Ser 1.13 (*)    GFR calc non Af Amer 48 (*)    GFR calc Af Amer 55 (*)    All other components within normal limits  TROPONIN I - Abnormal; Notable for the following:    Troponin I 0.05 (*)    All other components within normal limits  URINALYSIS, ROUTINE W REFLEX MICROSCOPIC - Abnormal; Notable for the following:    Color, Urine STRAW (*)    APPearance CLEAR (*)    All other components within normal limits  TROPONIN I - Abnormal; Notable for the following:    Troponin I 0.04 (*)    All other components within normal limits  CBC WITH DIFFERENTIAL/PLATELET  BRAIN NATRIURETIC PEPTIDE    RADIOLOGY  Chest x-ray Is unremarkable ____________________________________________  FINAL ASSESSMENT AND PLAN  Dyspnea  Plan: Patient's labs and imaging were dictated above. Patient had presented for dyspnea. At one point during my examination she stated "I feel like I'm about to start shaking again".Patient does have evidence of depression and that she mostly stays indoors and does not go outside or see anyone else. Family has suggested that we start her on Effexor which may help her symptoms. She is stable for outpatient follow-up.   Earleen Newport, MD   Note: This note was generated in part or whole with voice recognition software. Voice recognition is usually quite accurate but there are transcription errors that can and very often do occur. I apologize for any typographical errors that were not detected and corrected.     Earleen Newport, MD 06/20/17 [phone]

## 2017-06-20 NOTE — ED Notes (Signed)
Pt brought in by ems with shortness of breath, pt with hx of htn and same today.

## 2017-06-20 NOTE — ED Notes (Signed)
Date and time results received: 06/20/17 1600 (use smartphrase ".now" to insert current time)  Test: Troponin Critical Value: 0.05  Name of Provider Notified: Dr. Jimmye Norman  Orders Received? Or Actions Taken?:

## 2018-02-12 NOTE — Discharge Instructions (Signed)

## 2018-02-17 ENCOUNTER — Ambulatory Visit: Payer: Medicare Other | Admitting: Anesthesiology

## 2018-02-17 ENCOUNTER — Encounter: Payer: Self-pay | Admitting: Anesthesiology

## 2018-02-17 ENCOUNTER — Encounter
Admission: RE | Disposition: A | Discharge status: Home / Self Care | Payer: Self-pay | Source: Ambulatory Visit | Attending: Ophthalmology

## 2018-02-17 ENCOUNTER — Ambulatory Visit
Admission: RE | Admit: 2018-02-17 | Discharge: 2018-02-17 | Disposition: A | Discharge status: Home / Self Care | Payer: Medicare Other | Source: Ambulatory Visit | Attending: Ophthalmology | Admitting: Ophthalmology

## 2018-02-17 DIAGNOSIS — E7800 Pure hypercholesterolemia, unspecified: Secondary | ICD-10-CM | POA: Diagnosis not present

## 2018-02-17 DIAGNOSIS — M810 Age-related osteoporosis without current pathological fracture: Secondary | ICD-10-CM | POA: Insufficient documentation

## 2018-02-17 DIAGNOSIS — Z885 Allergy status to narcotic agent status: Secondary | ICD-10-CM | POA: Diagnosis not present

## 2018-02-17 DIAGNOSIS — G4730 Sleep apnea, unspecified: Secondary | ICD-10-CM | POA: Diagnosis not present

## 2018-02-17 DIAGNOSIS — I252 Old myocardial infarction: Secondary | ICD-10-CM | POA: Diagnosis not present

## 2018-02-17 DIAGNOSIS — I340 Nonrheumatic mitral (valve) insufficiency: Secondary | ICD-10-CM | POA: Insufficient documentation

## 2018-02-17 DIAGNOSIS — M109 Gout, unspecified: Secondary | ICD-10-CM | POA: Diagnosis not present

## 2018-02-17 DIAGNOSIS — I10 Essential (primary) hypertension: Secondary | ICD-10-CM | POA: Diagnosis not present

## 2018-02-17 DIAGNOSIS — Z8541 Personal history of malignant neoplasm of cervix uteri: Secondary | ICD-10-CM | POA: Insufficient documentation

## 2018-02-17 DIAGNOSIS — K219 Gastro-esophageal reflux disease without esophagitis: Secondary | ICD-10-CM | POA: Diagnosis not present

## 2018-02-17 DIAGNOSIS — G43909 Migraine, unspecified, not intractable, without status migrainosus: Secondary | ICD-10-CM | POA: Insufficient documentation

## 2018-02-17 DIAGNOSIS — J449 Chronic obstructive pulmonary disease, unspecified: Secondary | ICD-10-CM | POA: Insufficient documentation

## 2018-02-17 DIAGNOSIS — M7989 Other specified soft tissue disorders: Secondary | ICD-10-CM | POA: Diagnosis not present

## 2018-02-17 DIAGNOSIS — R002 Palpitations: Secondary | ICD-10-CM | POA: Insufficient documentation

## 2018-02-17 DIAGNOSIS — Z888 Allergy status to other drugs, medicaments and biological substances status: Secondary | ICD-10-CM | POA: Insufficient documentation

## 2018-02-17 DIAGNOSIS — M1990 Unspecified osteoarthritis, unspecified site: Secondary | ICD-10-CM | POA: Diagnosis not present

## 2018-02-17 DIAGNOSIS — H2512 Age-related nuclear cataract, left eye: Secondary | ICD-10-CM | POA: Diagnosis not present

## 2018-02-17 DIAGNOSIS — H5703 Miosis: Secondary | ICD-10-CM | POA: Insufficient documentation

## 2018-02-17 DIAGNOSIS — I2510 Atherosclerotic heart disease of native coronary artery without angina pectoris: Secondary | ICD-10-CM | POA: Diagnosis not present

## 2018-02-17 DIAGNOSIS — F329 Major depressive disorder, single episode, unspecified: Secondary | ICD-10-CM | POA: Diagnosis not present

## 2018-02-17 HISTORY — DX: Gout, unspecified: M10.9

## 2018-02-17 HISTORY — DX: Age-related osteoporosis without current pathological fracture: M81.0

## 2018-02-17 HISTORY — DX: Unspecified osteoarthritis, unspecified site: M19.90

## 2018-02-17 HISTORY — DX: Sleep apnea, unspecified: G47.30

## 2018-02-17 HISTORY — PX: CATARACT EXTRACTION W/PHACO: SHX586

## 2018-02-17 SURGERY — PHACOEMULSIFICATION, CATARACT, WITH IOL INSERTION
Anesthesia: Monitor Anesthesia Care | Site: Eye | Laterality: Left | Wound class: "Clean "

## 2018-02-17 MED ORDER — BRIMONIDINE TARTRATE-TIMOLOL 0.2-0.5 % OP SOLN
OPHTHALMIC | Status: DC | PRN
  Administered 2018-02-17: 1 [drp] via OPHTHALMIC

## 2018-02-17 MED ORDER — MIDAZOLAM HCL 2 MG/2ML IJ SOLN
INTRAMUSCULAR | Status: DC | PRN
  Administered 2018-02-17: 2 mg via INTRAVENOUS

## 2018-02-17 MED ORDER — ARMC OPHTHALMIC DILATING DROPS
1.0000 "application " | OPHTHALMIC | Status: DC | PRN
  Administered 2018-02-17 (×3): 1 via OPHTHALMIC

## 2018-02-17 MED ORDER — CEFUROXIME OPHTHALMIC INJECTION 1 MG/0.1 ML
INJECTION | OPHTHALMIC | Status: DC | PRN
  Administered 2018-02-17: 0.1 mL via INTRACAMERAL

## 2018-02-17 MED ORDER — NA HYALUR & NA CHOND-NA HYALUR 0.4-0.35 ML IO KIT
PACK | INTRAOCULAR | Status: DC | PRN
  Administered 2018-02-17: 1 mL via INTRAOCULAR

## 2018-02-17 MED ORDER — MOXIFLOXACIN HCL 0.5 % OP SOLN
1.0000 [drp] | OPHTHALMIC | Status: DC | PRN
  Administered 2018-02-17 (×3): 1 [drp] via OPHTHALMIC

## 2018-02-17 MED ORDER — EPINEPHRINE PF 1 MG/ML IJ SOLN
INTRAOCULAR | Status: DC | PRN
  Administered 2018-02-17: 58 mL via OPHTHALMIC

## 2018-02-17 MED ORDER — FENTANYL CITRATE (PF) 100 MCG/2ML IJ SOLN
INTRAMUSCULAR | Status: DC | PRN
  Administered 2018-02-17: 50 ug via INTRAVENOUS

## 2018-02-17 MED ORDER — LIDOCAINE HCL (PF) 2 % IJ SOLN
INTRAOCULAR | Status: DC | PRN
  Administered 2018-02-17: 1 mL

## 2018-02-17 MED ORDER — LACTATED RINGERS IV SOLN: INTRAVENOUS | Status: DC

## 2018-02-17 SURGICAL SUPPLY — 21 items
CANNULA ANT/CHMB 27G (MISCELLANEOUS) ×1 IMPLANT
CANNULA ANT/CHMB 27GA (MISCELLANEOUS) ×3 IMPLANT
GLOVE SURG LX 7.5 STRW (GLOVE) ×2
GLOVE SURG LX STRL 7.5 STRW (GLOVE) ×1 IMPLANT
GLOVE SURG TRIUMPH 8.0 PF LTX (GLOVE) ×3 IMPLANT
GOWN STRL REUS W/ TWL LRG LVL3 (GOWN DISPOSABLE) ×2 IMPLANT
GOWN STRL REUS W/TWL LRG LVL3 (GOWN DISPOSABLE) ×4
LENS IOL TECNIS ITEC 23.5 (Intraocular Lens) ×2 IMPLANT
MARKER SKIN DUAL TIP RULER LAB (MISCELLANEOUS) ×3 IMPLANT
NDL FILTER BLUNT 18X1 1/2 (NEEDLE) ×1 IMPLANT
NEEDLE FILTER BLUNT 18X 1/2SAF (NEEDLE) ×2
NEEDLE FILTER BLUNT 18X1 1/2 (NEEDLE) ×1 IMPLANT
PACK CATARACT BRASINGTON (MISCELLANEOUS) ×3 IMPLANT
PACK EYE AFTER SURG (MISCELLANEOUS) ×3 IMPLANT
PACK OPTHALMIC (MISCELLANEOUS) ×3 IMPLANT
RING MALYGIN 7.0 (MISCELLANEOUS) ×2 IMPLANT
SYR 3ML LL SCALE MARK (SYRINGE) ×3 IMPLANT
SYR 5ML LL (SYRINGE) ×3 IMPLANT
SYR TB 1ML LUER SLIP (SYRINGE) ×3 IMPLANT
WATER STERILE IRR 500ML POUR (IV SOLUTION) ×3 IMPLANT
WIPE NON LINTING 3.25X3.25 (MISCELLANEOUS) ×3 IMPLANT

## 2018-02-17 NOTE — Anesthesia Preprocedure Evaluation (Addendum)
Anesthesia Evaluation  Patient identified by MRN, date of birth, ID band  Reviewed: NPO status   History of Anesthesia Complications Negative for: history of anesthetic complications  Airway Mallampati: II  TM Distance: >3 FB Neck ROM: full    Dental  (+) Upper Dentures, Lower Dentures   Pulmonary sleep apnea and Continuous Positive Airway Pressure Ventilation ,    Pulmonary exam normal        Cardiovascular Exercise Tolerance: Good hypertension, + Past MI (mild 5 years ago)  Normal cardiovascular exam  echo: 2014: The cavity size was normal. Wall thickness was normal. Systolic function was normal. The estimated ejection fraction was in the range of 55% to 60%. Wall motion was normal; there were no regional wall motion abnormalities. Left ventricular diastolic function parameters were normal. - Mitral valve: Mild regurgitation. - Right ventricle: Systolic function was normal. - Pulmonary arteries: Systolic pressure was within the normal range.;  ekg: 2018: nsr;     Neuro/Psych negative neurological ROS  negative psych ROS   GI/Hepatic Neg liver ROS, GERD  Controlled,  Endo/Other  Morbid obesity (bmi=31;)  Renal/GU negative Renal ROS  negative genitourinary   Musculoskeletal  (+) Arthritis , gout   Abdominal   Peds  Hematology negative hematology ROS (+) Cervical ca : 1970s   Anesthesia Other Findings   Reproductive/Obstetrics                            Anesthesia Physical Anesthesia Plan  ASA: II  Anesthesia Plan: MAC   Post-op Pain Management:    Induction:   PONV Risk Score and Plan:   Airway Management Planned:   Additional Equipment:   Intra-op Plan:   Post-operative Plan:   Informed Consent: I have reviewed the patients History and Physical, chart, labs and discussed the procedure including the risks, benefits and alternatives for the proposed  anesthesia with the patient or authorized representative who has indicated his/her understanding and acceptance.     Plan Discussed with: CRNA  Anesthesia Plan Comments:         Anesthesia Quick Evaluation

## 2018-02-17 NOTE — Transfer of Care (Signed)
Immediate Anesthesia Transfer of Care Note  Patient: Elaine Norris  Procedure(s) Performed: CATARACT EXTRACTION PHACO AND INTRAOCULAR LENS PLACEMENT (I[facility]) LEFT (Left Eye)  Patient Location: PACU  Anesthesia Type: MAC  Level of Consciousness: awake, alert  and patient cooperative  Airway and Oxygen Therapy: Patient Spontanous Breathing and Patient connected to supplemental oxygen  Post-op Assessment: Post-op Vital signs reviewed, Patient's Cardiovascular Status Stable, Respiratory Function Stable, Patent Airway and No signs of Nausea or vomiting  Post-op Vital Signs: Reviewed and stable  Complications: No apparent anesthesia complications

## 2018-02-17 NOTE — Anesthesia Procedure Notes (Signed)
Procedure Name: MAC Date/Time: 02/17/2018 8:13 AM Performed by: Janna Arch, CRNA Pre-anesthesia Checklist: Patient identified, Emergency Drugs available, Suction available and Patient being monitored Patient Re-evaluated:Patient Re-evaluated prior to induction Oxygen Delivery Method: Nasal cannula

## 2018-02-17 NOTE — H&P (Signed)
The History and Physical notes are on paper, have been signed, and are to be scanned. The patient remains stable and unchanged from the H&P.   Previous H&P reviewed, patient examined, and there are no changes.  Elaine Norris 02/17/2018 7:42 AM

## 2018-02-17 NOTE — Op Note (Signed)
OPERATIVE NOTE  Elaine Norris [id number] 02/17/2018  PREOPERATIVE DIAGNOSIS:   Nuclear sclerotic cataract left eye with miotic pupil      H25.12   POSTOPERATIVE DIAGNOSIS:   Nuclear sclerotic cataract left eye with miotic pupil.     PROCEDURE:  Phacoemulsification with posterior chamber intraocular lens implantation of the left eye which required pupil stretching with the Malyugin pupil expansion device   LENS:   Implant Name Type Inv. Item Serial No. Manufacturer Lot No. LRB No. Used  LENS IOL DIOP 23.5 - [id number] Intraocular Lens LENS IOL DIOP 23.5 [id number]  Left 1        ULTRASOUND TIME: 20 % of 1 minutes, 33 seconds.  CDE 18.3   SURGEON:  Wyonia Hough, MD   ANESTHESIA: Topical with tetracaine drops and 2% Xylocaine jelly, augmented with 1% preservative-free intracameral lidocaine.   COMPLICATIONS:  None.   DESCRIPTION OF PROCEDURE:  The patient was identified in the holding room and transported to the operating room and placed in the supine position under the operating microscope.  The left eye was identified as the operative eye and it was prepped and draped in the usual sterile ophthalmic fashion.   A 1 millimeter clear-corneal paracentesis was made at the 1:30 position.  The anterior chamber was filled with Viscoat viscoelastic.  0.5 ml of preservative-free 1% lidocaine was injected into the anterior chamber.  A 2.4 millimeter keratome was used to make a near-clear corneal incision at the 10:30 position.  A Malyugin pupil expander was then placed through the main incision and into the anterior chamber of the eye.  The edge of the iris was secured on the lip of the pupil expander and it was released, thereby expanding the pupil to approximately 8 millimeters for completion of the cataract surgery.  Additional Viscoat was placed in the anterior chamber.  A cystotome and capsulorrhexis forceps were used to make a curvilinear capsulorrhexis.   Balanced salt solution  was used to hydrodissect and hydrodelineate the lens nucleus.   Phacoemulsification was used in stop and chop fashion to remove the lens, nucleus and epinucleus.  The remaining cortex was aspirated using the irrigation aspiration handpiece.  Additional Provisc was placed into the eye to distend the capsular bag for lens placement.  A lens was then injected into the capsular bag.  The pupil expanding ring was removed using a Kuglen hook and insertion device. The remaining viscoelastic was aspirated from the capsular bag and the anterior chamber.  The anterior chamber was filled with balanced salt solution to inflate to a physiologic pressure.   Wounds were hydrated with balanced salt solution.  The anterior chamber was inflated to a physiologic pressure with balanced salt solution.  No wound leaks were noted. Cefuroxime 0.1 ml of a 10mg /ml solution was injected into the anterior chamber for a dose of 1 mg of intracameral antibiotic at the completion of the case. \  Timolol and Brimonidine drops were applied to the eye.  The patient was taken to the recovery room in stable condition without complications of anesthesia or surgery.  [name] 02/17/2018, 8:33 AM

## 2018-02-17 NOTE — Anesthesia Postprocedure Evaluation (Signed)
Anesthesia Post Note  Patient: Elaine Norris  Procedure(s) Performed: CATARACT EXTRACTION PHACO AND INTRAOCULAR LENS PLACEMENT ([place]) LEFT (Left Eye)  Patient location during evaluation: PACU Anesthesia Type: MAC Level of consciousness: awake and alert Pain management: pain level controlled Vital Signs Assessment: post-procedure vital signs reviewed and stable Respiratory status: spontaneous breathing, nonlabored ventilation, respiratory function stable and patient connected to nasal cannula oxygen Cardiovascular status: stable and blood pressure returned to baseline Postop Assessment: no apparent nausea or vomiting Anesthetic complications: no    Lenardo Westwood

## 2018-02-18 ENCOUNTER — Encounter: Payer: Self-pay | Admitting: Ophthalmology

## 2018-02-27 ENCOUNTER — Encounter: Payer: Self-pay | Admitting: *Deleted

## 2018-02-27 ENCOUNTER — Other Ambulatory Visit: Payer: Self-pay

## 2018-03-06 NOTE — Discharge Instructions (Signed)

## 2018-03-10 ENCOUNTER — Encounter
Admission: RE | Disposition: A | Discharge status: Home / Self Care | Payer: Self-pay | Source: Ambulatory Visit | Attending: Ophthalmology

## 2018-03-10 ENCOUNTER — Ambulatory Visit: Payer: Medicare Other | Admitting: Anesthesiology

## 2018-03-10 ENCOUNTER — Ambulatory Visit
Admission: RE | Admit: 2018-03-10 | Discharge: 2018-03-10 | Disposition: A | Discharge status: Home / Self Care | Payer: Medicare Other | Source: Ambulatory Visit | Attending: Ophthalmology | Admitting: Ophthalmology

## 2018-03-10 DIAGNOSIS — J449 Chronic obstructive pulmonary disease, unspecified: Secondary | ICD-10-CM | POA: Insufficient documentation

## 2018-03-10 DIAGNOSIS — F329 Major depressive disorder, single episode, unspecified: Secondary | ICD-10-CM | POA: Diagnosis not present

## 2018-03-10 DIAGNOSIS — E7800 Pure hypercholesterolemia, unspecified: Secondary | ICD-10-CM | POA: Diagnosis not present

## 2018-03-10 DIAGNOSIS — H5703 Miosis: Secondary | ICD-10-CM | POA: Diagnosis not present

## 2018-03-10 DIAGNOSIS — Z8541 Personal history of malignant neoplasm of cervix uteri: Secondary | ICD-10-CM | POA: Diagnosis not present

## 2018-03-10 DIAGNOSIS — Z79899 Other long term (current) drug therapy: Secondary | ICD-10-CM | POA: Insufficient documentation

## 2018-03-10 DIAGNOSIS — I252 Old myocardial infarction: Secondary | ICD-10-CM | POA: Insufficient documentation

## 2018-03-10 DIAGNOSIS — G4730 Sleep apnea, unspecified: Secondary | ICD-10-CM | POA: Diagnosis not present

## 2018-03-10 DIAGNOSIS — I2510 Atherosclerotic heart disease of native coronary artery without angina pectoris: Secondary | ICD-10-CM | POA: Insufficient documentation

## 2018-03-10 DIAGNOSIS — M109 Gout, unspecified: Secondary | ICD-10-CM | POA: Diagnosis not present

## 2018-03-10 DIAGNOSIS — I10 Essential (primary) hypertension: Secondary | ICD-10-CM | POA: Insufficient documentation

## 2018-03-10 DIAGNOSIS — H2511 Age-related nuclear cataract, right eye: Secondary | ICD-10-CM | POA: Insufficient documentation

## 2018-03-10 DIAGNOSIS — K219 Gastro-esophageal reflux disease without esophagitis: Secondary | ICD-10-CM | POA: Diagnosis not present

## 2018-03-10 DIAGNOSIS — Z7982 Long term (current) use of aspirin: Secondary | ICD-10-CM | POA: Insufficient documentation

## 2018-03-10 HISTORY — PX: CATARACT EXTRACTION W/PHACO: SHX586

## 2018-03-10 SURGERY — PHACOEMULSIFICATION, CATARACT, WITH IOL INSERTION
Anesthesia: Monitor Anesthesia Care | Site: Eye | Laterality: Right | Wound class: "Clean "

## 2018-03-10 MED ORDER — FENTANYL CITRATE (PF) 100 MCG/2ML IJ SOLN
INTRAMUSCULAR | Status: DC | PRN
  Administered 2018-03-10: 50 ug via INTRAVENOUS
  Administered 2018-03-10: 25 ug via INTRAVENOUS

## 2018-03-10 MED ORDER — LACTATED RINGERS IV SOLN: 10.0000 mL/h | INTRAVENOUS | Status: DC

## 2018-03-10 MED ORDER — MIDAZOLAM HCL 2 MG/2ML IJ SOLN
INTRAMUSCULAR | Status: DC | PRN
  Administered 2018-03-10: 2 mg via INTRAVENOUS

## 2018-03-10 MED ORDER — EPINEPHRINE PF 1 MG/ML IJ SOLN
INTRAOCULAR | Status: DC | PRN
  Administered 2018-03-10: 60 mL via OPHTHALMIC

## 2018-03-10 MED ORDER — MOXIFLOXACIN HCL 0.5 % OP SOLN
1.0000 [drp] | OPHTHALMIC | Status: DC | PRN
  Administered 2018-03-10 (×3): 1 [drp] via OPHTHALMIC

## 2018-03-10 MED ORDER — CEFUROXIME OPHTHALMIC INJECTION 1 MG/0.1 ML
INJECTION | OPHTHALMIC | Status: DC | PRN
Start: 2018-03-10 — End: 2018-03-10
  Administered 2018-03-10: .3 mL via OPHTHALMIC

## 2018-03-10 MED ORDER — LIDOCAINE HCL (PF) 2 % IJ SOLN
INTRAOCULAR | Status: DC | PRN
  Administered 2018-03-10: 1 mL

## 2018-03-10 MED ORDER — ARMC OPHTHALMIC DILATING DROPS
1.0000 "application " | OPHTHALMIC | Status: DC | PRN
  Administered 2018-03-10 (×3): 1 via OPHTHALMIC

## 2018-03-10 MED ORDER — NA HYALUR & NA CHOND-NA HYALUR 0.4-0.35 ML IO KIT
PACK | INTRAOCULAR | Status: DC | PRN
  Administered 2018-03-10: 1 mL via INTRAOCULAR

## 2018-03-10 MED ORDER — BRIMONIDINE TARTRATE-TIMOLOL 0.2-0.5 % OP SOLN
OPHTHALMIC | Status: DC | PRN
  Administered 2018-03-10: 1 [drp] via OPHTHALMIC

## 2018-03-10 SURGICAL SUPPLY — 27 items
CANNULA ANT/CHMB 27G (MISCELLANEOUS) ×1 IMPLANT
CANNULA ANT/CHMB 27GA (MISCELLANEOUS) ×3 IMPLANT
CARTRIDGE ABBOTT (MISCELLANEOUS) IMPLANT
GLOVE SURG LX 7.5 STRW (GLOVE) ×2
GLOVE SURG LX STRL 7.5 STRW (GLOVE) ×1 IMPLANT
GLOVE SURG TRIUMPH 8.0 PF LTX (GLOVE) ×3 IMPLANT
GOWN STRL REUS W/ TWL LRG LVL3 (GOWN DISPOSABLE) ×2 IMPLANT
GOWN STRL REUS W/TWL LRG LVL3 (GOWN DISPOSABLE) ×4
LENS IOL TECNIS ITEC 24.0 (Intraocular Lens) ×2 IMPLANT
MARKER SKIN DUAL TIP RULER LAB (MISCELLANEOUS) ×3 IMPLANT
NDL FILTER BLUNT 18X1 1/2 (NEEDLE) ×1 IMPLANT
NDL RETROBULBAR .5 NSTRL (NEEDLE) IMPLANT
NEEDLE FILTER BLUNT 18X 1/2SAF (NEEDLE) ×2
NEEDLE FILTER BLUNT 18X1 1/2 (NEEDLE) ×1 IMPLANT
PACK CATARACT BRASINGTON (MISCELLANEOUS) ×3 IMPLANT
PACK EYE AFTER SURG (MISCELLANEOUS) ×3 IMPLANT
PACK OPTHALMIC (MISCELLANEOUS) ×3 IMPLANT
RING MALYGIN 7.0 (MISCELLANEOUS) ×2 IMPLANT
SUT ETHILON 10-0 CS-B-6CS-B-6 (SUTURE)
SUT VICRYL  9 0 (SUTURE)
SUT VICRYL 9 0 (SUTURE) IMPLANT
SUTURE EHLN 10-0 CS-B-6CS-B-6 (SUTURE) IMPLANT
SYR 3ML LL SCALE MARK (SYRINGE) ×3 IMPLANT
SYR 5ML LL (SYRINGE) ×3 IMPLANT
SYR TB 1ML LUER SLIP (SYRINGE) ×3 IMPLANT
WATER STERILE IRR 500ML POUR (IV SOLUTION) ×3 IMPLANT
WIPE NON LINTING 3.25X3.25 (MISCELLANEOUS) ×3 IMPLANT

## 2018-03-10 NOTE — Anesthesia Preprocedure Evaluation (Signed)
Anesthesia Evaluation  Patient identified by MRN, date of birth, ID band  Reviewed: NPO status   History of Anesthesia Complications Negative for: history of anesthetic complications  Airway Mallampati: II  TM Distance: >3 FB Neck ROM: full    Dental  (+) Upper Dentures, Lower Dentures   Pulmonary sleep apnea and Continuous Positive Airway Pressure Ventilation ,    Pulmonary exam normal        Cardiovascular Exercise Tolerance: Good hypertension, + Past MI (mild 5 years ago)  Normal cardiovascular exam  echo: 2014: The cavity size was normal. Wall thickness was normal. Systolic function was normal. The estimated ejection fraction was in the range of 55% to 60%. Wall motion was normal; there were no regional wall motion abnormalities. Left ventricular diastolic function parameters were normal. - Mitral valve: Mild regurgitation. - Right ventricle: Systolic function was normal. - Pulmonary arteries: Systolic pressure was within the normal range.;  ekg: 2018: nsr;     Neuro/Psych negative neurological ROS  negative psych ROS   GI/Hepatic Neg liver ROS, GERD  Controlled,  Endo/Other  Morbid obesity (bmi=31;)  Renal/GU negative Renal ROS  negative genitourinary   Musculoskeletal  (+) Arthritis , gout   Abdominal   Peds  Hematology negative hematology ROS (+) Cervical ca : 1970s   Anesthesia Other Findings   Reproductive/Obstetrics                             Anesthesia Physical  Anesthesia Plan  ASA: II  Anesthesia Plan: MAC   Post-op Pain Management:    Induction:   PONV Risk Score and Plan:   Airway Management Planned:   Additional Equipment:   Intra-op Plan:   Post-operative Plan:   Informed Consent: I have reviewed the patients History and Physical, chart, labs and discussed the procedure including the risks, benefits and alternatives for the proposed  anesthesia with the patient or authorized representative who has indicated his/her understanding and acceptance.     Plan Discussed with: CRNA  Anesthesia Plan Comments:         Anesthesia Quick Evaluation

## 2018-03-10 NOTE — Op Note (Signed)
OPERATIVE NOTE  Elaine Norris [id number] 03/10/2018   PREOPERATIVE DIAGNOSIS:    Nuclear Sclerotic Cataract Right eye with miotic pupil.        H25.11  POSTOPERATIVE DIAGNOSIS: Nuclear Sclerotic Cataract Right eye with miotic pupil.          PROCEDURE:  Phacoemusification with posterior chamber intraocular lens placement of the right eye which required pupil stretching with the Malyugin pupil expansion device.  LENS:   Implant Name Type Inv. Item Serial No. Manufacturer Lot No. LRB No. Used  LENS IOL DIOP 24.0 - [id number] Intraocular Lens LENS IOL DIOP 24.0 [id number]  Right 1       ULTRASOUND TIME: 18 % of 0 minutes 54 seconds, CDE 10.1  SURGEON:  Wyonia Hough, MD   ANESTHESIA:  Topical with tetracaine drops and 2% Xylocaine jelly, augmented with 1% preservative-free intracameral lidocaine.   COMPLICATIONS:  None.   DESCRIPTION OF PROCEDURE:  The patient was identified in the holding room and transported to the operating room and placed in the supine position under the operating microscope. Theright eye was identified as the operative eye and it was prepped and draped in the usual sterile ophthalmic fashion.   A 1 millimeter clear-corneal paracentesis was made at the 12:00 position.  0.5 ml of preservative-free 1% lidocaine was injected into the anterior chamber. The anterior chamber was filled with Viscoat viscoelastic.  A 2.4 millimeter keratome was used to make a near-clear corneal incision at the 9:00 position. A Malyugin pupil expander was then placed through the main incision and into the anterior chamber of the eye.  The edge of the iris was secured on the lip of the pupil expander and it was released, thereby expanding the pupil to approximately 7 millimeters for completion of the cataract surgery.  Additional Viscoat was placed in the anterior chamber.  A cystotome and capsulorrhexis forceps were used to make a curvilinear capsulorrhexis.   Balanced salt  solution was used to hydrodissect and hydrodelineate the lens nucleus.   Phacoemulsification was used in stop and chop fashion to remove the lens, nucleus and epinucleus.  The remaining cortex was aspirated using the irrigation aspiration handpiece.  Additional Provisc was placed into the eye to distend the capsular bag for lens placement.  A lens was then injected into the capsular bag.  The pupil expanding ring was removed using a Kuglen hook and insertion device. The remaining viscoelastic was aspirated from the capsular bag and the anterior chamber.  The anterior chamber was filled with balanced salt solution to inflate to a physiologic pressure.  Wounds were hydrated with balanced salt solution.  The anterior chamber was inflated to a physiologic pressure with balanced salt solution.  No wound leaks were noted.Cefuroxime 0.1 ml of a 10mg /ml solution was injected into the anterior chamber for a dose of 1 mg of intracameral antibiotic at the completion of the case. Timolol and Brimonidine drops were applied to the eye.  The patient was taken to the recovery room in stable condition without complications of anesthesia or surgery.  [name] 03/10/2018, 8:37 AM

## 2018-03-10 NOTE — H&P (Signed)
The History and Physical notes are on paper, have been signed, and are to be scanned. The patient remains stable and unchanged from the H&P.   Previous H&P reviewed, patient examined, and there are no changes.  Rudi Bunyard 03/10/2018 7:38 AM

## 2018-03-10 NOTE — Anesthesia Procedure Notes (Signed)
Procedure Name: MAC Date/Time: 03/10/2018 8:08 AM Performed by: Janna Arch, CRNA Pre-anesthesia Checklist: Patient identified, Emergency Drugs available, Suction available and Patient being monitored Patient Re-evaluated:Patient Re-evaluated prior to induction Oxygen Delivery Method: Nasal cannula

## 2018-03-10 NOTE — Anesthesia Postprocedure Evaluation (Signed)
Anesthesia Post Note  Patient: Elaine Norris  Procedure(s) Performed: CATARACT EXTRACTION PHACO AND INTRAOCULAR LENS PLACEMENT ([facility]) RIGHT  COMPLICATED (Right Eye)  Patient location during evaluation: PACU Anesthesia Type: MAC Level of consciousness: awake and alert Pain management: pain level controlled Vital Signs Assessment: post-procedure vital signs reviewed and stable Respiratory status: spontaneous breathing, nonlabored ventilation, respiratory function stable and patient connected to nasal cannula oxygen Cardiovascular status: stable and blood pressure returned to baseline Postop Assessment: no apparent nausea or vomiting Anesthetic complications: no    Alisa Graff

## 2018-03-10 NOTE — Op Note (Deleted)
LOCATION:  [place]   PREOPERATIVE DIAGNOSIS:    Nuclear sclerotic cataract right eye. H25.11   POSTOPERATIVE DIAGNOSIS:  Nuclear sclerotic cataract right eye.     PROCEDURE:  Phacoemusification with posterior chamber intraocular lens placement of the right eye   LENS:   Implant Name Type Inv. Item Serial No. Manufacturer Lot No. LRB No. Used  LENS IOL DIOP 24.0 - [id number] Intraocular Lens LENS IOL DIOP 24.0 [id number]  Right 1        ULTRASOUND TIME: 18 % of 0 minutes, 54 seconds.  CDE 10.1   SURGEON:  Wyonia Hough, MD   ANESTHESIA:  Topical with tetracaine drops and 2% Xylocaine jelly, augmented with 1% preservative-free intracameral lidocaine.    COMPLICATIONS:  None.   DESCRIPTION OF PROCEDURE:  The patient was identified in the holding room and transported to the operating room and placed in the supine position under the operating microscope.  The right eye was identified as the operative eye and it was prepped and draped in the usual sterile ophthalmic fashion.   A 1 millimeter clear-corneal paracentesis was made at the 12:00 position.  0.5 ml of preservative-free 1% lidocaine was injected into the anterior chamber. The anterior chamber was filled with Viscoat viscoelastic.  A 2.4 millimeter keratome was used to make a near-clear corneal incision at the 9:00 position.  A curvilinear capsulorrhexis was made with a cystotome and capsulorrhexis forceps.  Balanced salt solution was used to hydrodissect and hydrodelineate the nucleus.   Phacoemulsification was then used in stop and chop fashion to remove the lens nucleus and epinucleus.  The remaining cortex was then removed using the irrigation and aspiration handpiece. Provisc was then placed into the capsular bag to distend it for lens placement.  A lens was then injected into the capsular bag.  The remaining viscoelastic was aspirated.   Wounds were hydrated with balanced salt solution.  The anterior  chamber was inflated to a physiologic pressure with balanced salt solution.  No wound leaks were noted. Cefuroxime 0.1 ml of a 10mg /ml solution was injected into the anterior chamber for a dose of 1 mg of intracameral antibiotic at the completion of the case.   Timolol and Brimonidine drops were applied to the eye.  The patient was taken to the recovery room in stable condition without complications of anesthesia or surgery.   Lulie Hurd 03/10/2018, 8:28 AM

## 2018-03-10 NOTE — Transfer of Care (Signed)
Immediate Anesthesia Transfer of Care Note  Patient: Elaine Norris  Procedure(s) Performed: CATARACT EXTRACTION PHACO AND INTRAOCULAR LENS PLACEMENT ([facility]) RIGHT  COMPLICATED (Right Eye)  Patient Location: PACU  Anesthesia Type: MAC  Level of Consciousness: awake, alert  and patient cooperative  Airway and Oxygen Therapy: Patient Spontanous Breathing and Patient connected to supplemental oxygen  Post-op Assessment: Post-op Vital signs reviewed, Patient's Cardiovascular Status Stable, Respiratory Function Stable, Patent Airway and No signs of Nausea or vomiting  Post-op Vital Signs: Reviewed and stable  Complications: No apparent anesthesia complications

## 2018-03-11 ENCOUNTER — Encounter: Payer: Self-pay | Admitting: Ophthalmology

## 2018-04-10 ENCOUNTER — Other Ambulatory Visit: Payer: Self-pay | Admitting: Internal Medicine

## 2018-04-10 DIAGNOSIS — Z1231 Encounter for screening mammogram for malignant neoplasm of breast: Secondary | ICD-10-CM

## 2018-05-22 ENCOUNTER — Ambulatory Visit
Admission: RE | Admit: 2018-05-22 | Discharge: 2018-05-22 | Disposition: A | Discharge status: Home / Self Care | Payer: Medicare Other | Source: Ambulatory Visit | Attending: Internal Medicine | Admitting: Internal Medicine

## 2018-05-22 DIAGNOSIS — Z1231 Encounter for screening mammogram for malignant neoplasm of breast: Secondary | ICD-10-CM | POA: Diagnosis present

## 2018-05-28 ENCOUNTER — Ambulatory Visit: Payer: Self-pay | Admitting: General Surgery

## 2018-06-19 ENCOUNTER — Other Ambulatory Visit: Payer: Self-pay

## 2018-06-19 ENCOUNTER — Encounter
Admission: RE | Admit: 2018-06-19 | Discharge: 2018-06-19 | Disposition: A | Discharge status: Home / Self Care | Payer: Medicare Other | Source: Ambulatory Visit | Attending: General Surgery | Admitting: General Surgery

## 2018-06-19 DIAGNOSIS — I10 Essential (primary) hypertension: Secondary | ICD-10-CM | POA: Diagnosis not present

## 2018-06-19 DIAGNOSIS — Z01810 Encounter for preprocedural cardiovascular examination: Secondary | ICD-10-CM | POA: Insufficient documentation

## 2018-06-19 DIAGNOSIS — Z01812 Encounter for preprocedural laboratory examination: Secondary | ICD-10-CM | POA: Insufficient documentation

## 2018-06-19 HISTORY — DX: Gastro-esophageal reflux disease without esophagitis: K21.9

## 2018-06-19 HISTORY — DX: Anxiety disorder, unspecified: F41.9

## 2018-06-19 HISTORY — DX: Major depressive disorder, single episode, unspecified: F32.9

## 2018-06-19 HISTORY — DX: Depression, unspecified: F32.A

## 2018-06-19 LAB — COMPREHENSIVE METABOLIC PANEL
ALK PHOS: 57 U/L (ref 38–126)
ALT: 20 U/L (ref 0–44)
AST: 24 U/L (ref 15–41)
Albumin: 4.2 g/dL (ref 3.5–5.0)
Anion gap: 7 (ref 5–15)
BILIRUBIN TOTAL: 0.6 mg/dL (ref 0.3–1.2)
BUN: 25 mg/dL — AB (ref 8–23)
CALCIUM: 8.9 mg/dL (ref 8.9–10.3)
CO2: 30 mmol/L (ref 22–32)
CREATININE: 1.53 mg/dL — AB (ref 0.44–1.00)
Chloride: 102 mmol/L (ref 98–111)
GFR calc Af Amer: 38 mL/min — ABNORMAL LOW (ref >60)
GFR, EST NON AFRICAN AMERICAN: 33 mL/min — AB (ref >60)
Glucose, Bld: 116 mg/dL — ABNORMAL HIGH (ref 70–99)
Potassium: 3.6 mmol/L (ref 3.5–5.1)
Sodium: 139 mmol/L (ref 135–145)
TOTAL PROTEIN: 7 g/dL (ref 6.5–8.1)

## 2018-06-19 LAB — CBC WITH DIFFERENTIAL/PLATELET
Basophils Absolute: 0 10*3/uL (ref 0–0.1)
Basophils Relative: 1 %
Eosinophils Absolute: 0.2 10*3/uL (ref 0–0.7)
Eosinophils Relative: 3 %
HEMATOCRIT: 33.4 % — AB (ref 35.0–47.0)
HEMOGLOBIN: 11.5 g/dL — AB (ref 12.0–16.0)
LYMPHS PCT: 20 %
Lymphs Abs: 1.4 10*3/uL (ref 1.0–3.6)
MCH: 30.7 pg (ref 26.0–34.0)
MCHC: 34.5 g/dL (ref 32.0–36.0)
MCV: 89 fL (ref 80.0–100.0)
MONO ABS: 0.5 10*3/uL (ref 0.2–0.9)
Monocytes Relative: 7 %
NEUTROS ABS: 5.1 10*3/uL (ref 1.4–6.5)
Neutrophils Relative %: 69 %
Platelets: 174 10*3/uL (ref 150–440)
RBC: 3.75 MIL/uL — AB (ref 3.80–5.20)
RDW: 14.8 % — AB (ref 11.5–14.5)
WBC: 7.3 10*3/uL (ref 3.6–11.0)

## 2018-06-19 NOTE — Patient Instructions (Signed)
Your procedure is scheduled on: Thursday 06/26/18  Report to [place]. To find out your arrival time please call [phone] between 1PM - 3PM on Wednesday 06/25/18  Remember: Instructions that are not followed completely may result in serious medical risk, up to and including death, or upon the discretion of your surgeon and anesthesiologist your surgery may need to be rescheduled.     _X__ 1. Do not eat food after midnight the night before your procedure.                 No gum chewing or hard candies. You may drink clear liquids up to 2 hours                 before you are scheduled to arrive for your surgery- DO not drink clear                 liquids within 2 hours of the start of your surgery.                 Clear Liquids include:  water, apple juice without pulp, clear carbohydrate                 drink such as Clearfast or Gatorade, Black Coffee or Tea (Do not add                 anything to coffee or tea).  __X__2.  On the morning of surgery brush your teeth with toothpaste and water, you may rinse your mouth with mouthwash if you wish.  Do not swallow any toothpaste of mouthwash.     _X__ 3.  No Alcohol for 24 hours before or after surgery.   _X__ 4.  Do Not Smoke or use e-cigarettes For 24 Hours Prior to Your Surgery.                 Do not use any chewable tobacco products for at least 6 hours prior to                 surgery.  ____  5.  Bring all medications with you on the day of surgery if instructed.   __X_ 6.  Notify your doctor if there is any change in your medical condition      (cold, fever, infections).     Do not wear jewelry, make-up, hairpins, clips or nail polish. Do not wear lotions, powders, or perfumes.  Do not shave 48 hours prior to surgery. Men may shave face and neck. Do not bring valuables to the hospital.    [facility] is not responsible for any belongings or valuables.  Contacts,  dentures/partials or body piercings may not be worn into surgery. Bring a case for your contacts, glasses or hearing aids, a denture cup will be supplied. Leave your suitcase in the car. After surgery it may be brought to your room. For patients admitted to the hospital, discharge time is determined by your treatment team.   Patients discharged the day of surgery will not be allowed to drive home.   Please read over the following fact sheets that you were given:   MRSA Information  __X__ Take these medicines the morning of surgery with A SIP OF WATER:     1. hydrALAZINE (APRESOLINE) 100 MG tablet  2. labetalol (NORMODYNE) 200 MG tablet  3. losartan-hydrochlorothiazide (HYZAAR) 100-12.5 MG tablet  4. pantoprazole (PROTONIX) 40 MG tablet  5.  6.     __X__ Use CHG Soap/SAGE wipes as directed  __X__ Elizebeth Koller the entire bottle of Ensure Pre-Surgery CLEAR Carbohydrate Drink by 4:00am on the morning of surgery if your procedure remains scheduled for 7:15. If it is rescheduled for a later time, make sure to finish the bottle 3 hours before your scheduled OR time.     __X__ TODAY Stop Anti-inflammatories 7 days before surgery such as Advil, Ibuprofen, Motrin, BC or Goodies Powder, Naprosyn, Naproxen, Aleve, Aspirin, Meloxicam. May take Tylenol if needed for pain or discomfort.

## 2018-06-25 MED ORDER — SODIUM CHLORIDE 0.9 % IV SOLN
2.0000 g | INTRAVENOUS | Status: AC
  Administered 2018-06-26: 2 g via INTRAVENOUS
  Filled 2018-06-25: qty 2

## 2018-06-26 ENCOUNTER — Encounter
Admission: RE | Disposition: A | Discharge status: Home / Self Care | Payer: Self-pay | Source: Ambulatory Visit | Attending: Internal Medicine

## 2018-06-26 ENCOUNTER — Encounter: Payer: Self-pay | Admitting: *Deleted

## 2018-06-26 ENCOUNTER — Other Ambulatory Visit: Payer: Self-pay

## 2018-06-26 ENCOUNTER — Ambulatory Visit: Payer: Medicare Other | Admitting: Certified Registered Nurse Anesthetist

## 2018-06-26 ENCOUNTER — Observation Stay
Admission: RE | Admit: 2018-06-26 | Discharge: 2018-06-28 | Disposition: A | Discharge status: Home / Self Care | Payer: Medicare Other | Source: Ambulatory Visit | Attending: Internal Medicine | Admitting: Internal Medicine

## 2018-06-26 DIAGNOSIS — M19042 Primary osteoarthritis, left hand: Secondary | ICD-10-CM | POA: Insufficient documentation

## 2018-06-26 DIAGNOSIS — I10 Essential (primary) hypertension: Secondary | ICD-10-CM | POA: Diagnosis not present

## 2018-06-26 DIAGNOSIS — Z885 Allergy status to narcotic agent status: Secondary | ICD-10-CM | POA: Diagnosis not present

## 2018-06-26 DIAGNOSIS — Z8249 Family history of ischemic heart disease and other diseases of the circulatory system: Secondary | ICD-10-CM | POA: Insufficient documentation

## 2018-06-26 DIAGNOSIS — R251 Tremor, unspecified: Secondary | ICD-10-CM | POA: Insufficient documentation

## 2018-06-26 DIAGNOSIS — G4730 Sleep apnea, unspecified: Secondary | ICD-10-CM | POA: Diagnosis not present

## 2018-06-26 DIAGNOSIS — Z888 Allergy status to other drugs, medicaments and biological substances status: Secondary | ICD-10-CM | POA: Insufficient documentation

## 2018-06-26 DIAGNOSIS — E785 Hyperlipidemia, unspecified: Secondary | ICD-10-CM | POA: Diagnosis not present

## 2018-06-26 DIAGNOSIS — M109 Gout, unspecified: Secondary | ICD-10-CM | POA: Insufficient documentation

## 2018-06-26 DIAGNOSIS — M19041 Primary osteoarthritis, right hand: Secondary | ICD-10-CM | POA: Insufficient documentation

## 2018-06-26 DIAGNOSIS — Z8541 Personal history of malignant neoplasm of cervix uteri: Secondary | ICD-10-CM | POA: Diagnosis not present

## 2018-06-26 DIAGNOSIS — I252 Old myocardial infarction: Secondary | ICD-10-CM | POA: Diagnosis not present

## 2018-06-26 DIAGNOSIS — K219 Gastro-esophageal reflux disease without esophagitis: Secondary | ICD-10-CM | POA: Insufficient documentation

## 2018-06-26 DIAGNOSIS — Z9842 Cataract extraction status, left eye: Secondary | ICD-10-CM | POA: Diagnosis not present

## 2018-06-26 DIAGNOSIS — Z79899 Other long term (current) drug therapy: Secondary | ICD-10-CM | POA: Insufficient documentation

## 2018-06-26 DIAGNOSIS — Z7982 Long term (current) use of aspirin: Secondary | ICD-10-CM | POA: Insufficient documentation

## 2018-06-26 DIAGNOSIS — Z9841 Cataract extraction status, right eye: Secondary | ICD-10-CM | POA: Diagnosis not present

## 2018-06-26 DIAGNOSIS — Z6831 Body mass index (BMI) 31.0-31.9, adult: Secondary | ICD-10-CM | POA: Diagnosis not present

## 2018-06-26 DIAGNOSIS — M816 Localized osteoporosis [Lequesne]: Secondary | ICD-10-CM | POA: Insufficient documentation

## 2018-06-26 DIAGNOSIS — K811 Chronic cholecystitis: Secondary | ICD-10-CM | POA: Diagnosis present

## 2018-06-26 DIAGNOSIS — Z961 Presence of intraocular lens: Secondary | ICD-10-CM | POA: Diagnosis not present

## 2018-06-26 DIAGNOSIS — J320 Chronic maxillary sinusitis: Principal | ICD-10-CM | POA: Insufficient documentation

## 2018-06-26 DIAGNOSIS — R569 Unspecified convulsions: Secondary | ICD-10-CM

## 2018-06-26 HISTORY — PX: CHOLECYSTECTOMY: SHX55

## 2018-06-26 LAB — COMPREHENSIVE METABOLIC PANEL
ALT: 45 U/L — ABNORMAL HIGH (ref 0–44)
ANION GAP: 10 (ref 5–15)
AST: 58 U/L — ABNORMAL HIGH (ref 15–41)
Albumin: 4.1 g/dL (ref 3.5–5.0)
Alkaline Phosphatase: 66 U/L (ref 38–126)
BUN: 17 mg/dL (ref 8–23)
CHLORIDE: 101 mmol/L (ref 98–111)
CO2: 27 mmol/L (ref 22–32)
Calcium: 9.2 mg/dL (ref 8.9–10.3)
Creatinine, Ser: 1.4 mg/dL — ABNORMAL HIGH (ref 0.44–1.00)
GFR, EST AFRICAN AMERICAN: 42 mL/min — AB (ref >60)
GFR, EST NON AFRICAN AMERICAN: 37 mL/min — AB (ref >60)
Glucose, Bld: 170 mg/dL — ABNORMAL HIGH (ref 70–99)
Potassium: 4.4 mmol/L (ref 3.5–5.1)
Sodium: 138 mmol/L (ref 135–145)
Total Bilirubin: 0.6 mg/dL (ref 0.3–1.2)
Total Protein: 7.1 g/dL (ref 6.5–8.1)

## 2018-06-26 LAB — CBC
HCT: 36.4 % (ref 35.0–47.0)
Hemoglobin: 12.5 g/dL (ref 12.0–16.0)
MCH: 31 pg (ref 26.0–34.0)
MCHC: 34.4 g/dL (ref 32.0–36.0)
MCV: 90.2 fL (ref 80.0–100.0)
PLATELETS: 179 10*3/uL (ref 150–440)
RBC: 4.04 MIL/uL (ref 3.80–5.20)
RDW: 14.9 % — AB (ref 11.5–14.5)
WBC: 10.3 10*3/uL (ref 3.6–11.0)

## 2018-06-26 SURGERY — LAPAROSCOPIC CHOLECYSTECTOMY
Anesthesia: General | Wound class: "Clean Contaminated "

## 2018-06-26 MED ORDER — GLYCOPYRROLATE 0.2 MG/ML IJ SOLN
INTRAMUSCULAR | Status: AC
  Filled 2018-06-26: qty 1

## 2018-06-26 MED ORDER — TRAMADOL HCL 50 MG PO TABS
50.0000 mg | ORAL_TABLET | Freq: Three times a day (TID) | ORAL | 0 refills | Status: DC | PRN
Start: 2018-06-26 — End: 2018-06-28

## 2018-06-26 MED ORDER — LABETALOL HCL 5 MG/ML IV SOLN
INTRAVENOUS | Status: AC
  Filled 2018-06-26: qty 4

## 2018-06-26 MED ORDER — BACLOFEN 10 MG PO TABS
10.0000 mg | ORAL_TABLET | Freq: Every day | ORAL | Status: DC
  Administered 2018-06-26: 10 mg via ORAL
  Filled 2018-06-26 (×2): qty 1

## 2018-06-26 MED ORDER — ALLOPURINOL 100 MG PO TABS
100.0000 mg | ORAL_TABLET | Freq: Every day | ORAL | Status: DC
  Administered 2018-06-26 – 2018-06-27 (×2): 100 mg via ORAL
  Filled 2018-06-26 (×2): qty 1

## 2018-06-26 MED ORDER — EVICEL 2 ML EX KIT
PACK | CUTANEOUS | Status: AC
  Filled 2018-06-26: qty 1

## 2018-06-26 MED ORDER — NEOSTIGMINE METHYLSULFATE 10 MG/10ML IV SOLN
INTRAVENOUS | Status: AC
  Filled 2018-06-26: qty 1

## 2018-06-26 MED ORDER — ACETAMINOPHEN 325 MG PO TABS
650.0000 mg | ORAL_TABLET | Freq: Four times a day (QID) | ORAL | Status: DC | PRN
  Administered 2018-06-27 – 2018-06-28 (×2): 650 mg via ORAL
  Filled 2018-06-26 (×2): qty 2

## 2018-06-26 MED ORDER — ACETAMINOPHEN 650 MG RE SUPP: 650.0000 mg | Freq: Four times a day (QID) | RECTAL | Status: DC | PRN

## 2018-06-26 MED ORDER — FLUMAZENIL 0.5 MG/5ML IV SOLN: 0.2000 mg | Freq: Once | INTRAVENOUS | Status: DC

## 2018-06-26 MED ORDER — ENOXAPARIN SODIUM 40 MG/0.4ML ~~LOC~~ SOLN
40.0000 mg | SUBCUTANEOUS | Status: DC
  Administered 2018-06-27 – 2018-06-28 (×2): 40 mg via SUBCUTANEOUS
  Filled 2018-06-26 (×2): qty 0.4

## 2018-06-26 MED ORDER — DEXAMETHASONE SODIUM PHOSPHATE 10 MG/ML IJ SOLN
INTRAMUSCULAR | Status: AC
  Filled 2018-06-26: qty 1

## 2018-06-26 MED ORDER — POTASSIUM CHLORIDE CRYS ER 10 MEQ PO TBCR
10.0000 meq | EXTENDED_RELEASE_TABLET | ORAL | Status: DC
  Administered 2018-06-27: 10 meq via ORAL
  Filled 2018-06-26: qty 1

## 2018-06-26 MED ORDER — LABETALOL HCL 200 MG PO TABS
200.0000 mg | ORAL_TABLET | Freq: Two times a day (BID) | ORAL | Status: DC
  Administered 2018-06-26 – 2018-06-28 (×3): 200 mg via ORAL
  Filled 2018-06-26 (×5): qty 1

## 2018-06-26 MED ORDER — ASPIRIN EC 81 MG PO TBEC
81.0000 mg | DELAYED_RELEASE_TABLET | Freq: Every day | ORAL | Status: DC
  Administered 2018-06-26 – 2018-06-27 (×2): 81 mg via ORAL
  Filled 2018-06-26 (×3): qty 1

## 2018-06-26 MED ORDER — LABETALOL HCL 5 MG/ML IV SOLN
INTRAVENOUS | Status: DC | PRN
  Administered 2018-06-26: 5 mg via INTRAVENOUS

## 2018-06-26 MED ORDER — ONDANSETRON HCL 4 MG/2ML IJ SOLN: 4.0000 mg | Freq: Four times a day (QID) | INTRAMUSCULAR | Status: DC | PRN

## 2018-06-26 MED ORDER — ONDANSETRON HCL 4 MG/2ML IJ SOLN
INTRAMUSCULAR | Status: AC
  Filled 2018-06-26: qty 2

## 2018-06-26 MED ORDER — HYDROCHLOROTHIAZIDE 12.5 MG PO CAPS
12.5000 mg | ORAL_CAPSULE | Freq: Every day | ORAL | Status: DC
  Administered 2018-06-26 – 2018-06-28 (×3): 12.5 mg via ORAL
  Filled 2018-06-26 (×3): qty 1

## 2018-06-26 MED ORDER — EPHEDRINE SULFATE 50 MG/ML IJ SOLN
INTRAMUSCULAR | Status: AC
  Filled 2018-06-26: qty 1

## 2018-06-26 MED ORDER — LIDOCAINE HCL (CARDIAC) PF 100 MG/5ML IV SOSY
PREFILLED_SYRINGE | INTRAVENOUS | Status: DC | PRN
  Administered 2018-06-26: 80 mg via INTRAVENOUS

## 2018-06-26 MED ORDER — SUCCINYLCHOLINE CHLORIDE 20 MG/ML IJ SOLN
INTRAMUSCULAR | Status: AC
  Filled 2018-06-26: qty 1

## 2018-06-26 MED ORDER — BUPIVACAINE-EPINEPHRINE 0.25% -1:200000 IJ SOLN
INTRAMUSCULAR | Status: DC | PRN
  Administered 2018-06-26: 30 mL

## 2018-06-26 MED ORDER — FENTANYL CITRATE (PF) 100 MCG/2ML IJ SOLN
INTRAMUSCULAR | Status: AC
  Administered 2018-06-26: 25 ug via INTRAVENOUS
  Filled 2018-06-26: qty 2

## 2018-06-26 MED ORDER — GLYCOPYRROLATE 0.2 MG/ML IJ SOLN
INTRAMUSCULAR | Status: AC
  Filled 2018-06-26: qty 2

## 2018-06-26 MED ORDER — SEVOFLURANE IN SOLN
RESPIRATORY_TRACT | Status: AC
  Filled 2018-06-26: qty 250

## 2018-06-26 MED ORDER — SUCCINYLCHOLINE CHLORIDE 20 MG/ML IJ SOLN
INTRAMUSCULAR | Status: DC | PRN
  Administered 2018-06-26: 100 mg via INTRAVENOUS

## 2018-06-26 MED ORDER — PANTOPRAZOLE SODIUM 40 MG PO TBEC
40.0000 mg | DELAYED_RELEASE_TABLET | Freq: Every day | ORAL | Status: DC
  Administered 2018-06-27 – 2018-06-28 (×2): 40 mg via ORAL
  Filled 2018-06-26 (×2): qty 1

## 2018-06-26 MED ORDER — PROPOFOL 10 MG/ML IV BOLUS
INTRAVENOUS | Status: AC
  Filled 2018-06-26: qty 20

## 2018-06-26 MED ORDER — FENTANYL CITRATE (PF) 100 MCG/2ML IJ SOLN
INTRAMUSCULAR | Status: AC
  Filled 2018-06-26: qty 2

## 2018-06-26 MED ORDER — GLYCOPYRROLATE 0.2 MG/ML IJ SOLN
INTRAMUSCULAR | Status: DC | PRN
  Administered 2018-06-26: 0.2 mg via INTRAVENOUS

## 2018-06-26 MED ORDER — PHENYLEPHRINE HCL 10 MG/ML IJ SOLN
INTRAMUSCULAR | Status: AC
  Filled 2018-06-26: qty 1

## 2018-06-26 MED ORDER — IBUPROFEN 800 MG PO TABS
ORAL_TABLET | ORAL | Status: AC
  Filled 2018-06-26: qty 1

## 2018-06-26 MED ORDER — ROCURONIUM BROMIDE 100 MG/10ML IV SOLN
INTRAVENOUS | Status: DC | PRN
  Administered 2018-06-26: 45 mg via INTRAVENOUS
  Administered 2018-06-26: 5 mg via INTRAVENOUS

## 2018-06-26 MED ORDER — SODIUM CHLORIDE 0.9% FLUSH: 3.0000 mL | INTRAVENOUS | Status: DC | PRN

## 2018-06-26 MED ORDER — ACETAMINOPHEN 500 MG PO TABS
1000.0000 mg | ORAL_TABLET | ORAL | Status: AC
  Administered 2018-06-26: 1000 mg via ORAL

## 2018-06-26 MED ORDER — SODIUM CHLORIDE 0.9% FLUSH
3.0000 mL | Freq: Two times a day (BID) | INTRAVENOUS | Status: DC
  Administered 2018-06-26 – 2018-06-28 (×4): 3 mL via INTRAVENOUS

## 2018-06-26 MED ORDER — LIDOCAINE HCL (PF) 2 % IJ SOLN
INTRAMUSCULAR | Status: AC
Start: 2018-06-26 — End: ?
  Filled 2018-06-26: qty 10

## 2018-06-26 MED ORDER — HEPARIN SODIUM (PORCINE) 10000 UNIT/ML IJ SOLN
INTRAMUSCULAR | Status: AC
  Filled 2018-06-26: qty 1

## 2018-06-26 MED ORDER — MIDAZOLAM HCL 2 MG/2ML IJ SOLN
INTRAMUSCULAR | Status: DC | PRN
  Administered 2018-06-26: 2 mg via INTRAVENOUS

## 2018-06-26 MED ORDER — FENTANYL CITRATE (PF) 100 MCG/2ML IJ SOLN
25.0000 ug | INTRAMUSCULAR | Status: DC | PRN
  Administered 2018-06-26 (×4): 25 ug via INTRAVENOUS

## 2018-06-26 MED ORDER — ATORVASTATIN CALCIUM 20 MG PO TABS
40.0000 mg | ORAL_TABLET | Freq: Every day | ORAL | Status: DC
Start: 2018-06-26 — End: 2018-06-28
  Administered 2018-06-26 – 2018-06-27 (×2): 40 mg via ORAL
  Filled 2018-06-26 (×2): qty 2

## 2018-06-26 MED ORDER — LOSARTAN POTASSIUM-HCTZ 100-12.5 MG PO TABS: 1.0000 | ORAL_TABLET | Freq: Every day | ORAL | Status: DC

## 2018-06-26 MED ORDER — ENSURE ENLIVE PO LIQD: 296.0000 mL | Freq: Once | ORAL | Status: DC

## 2018-06-26 MED ORDER — LACTATED RINGERS IV SOLN
INTRAVENOUS | Status: DC
  Administered 2018-06-26: 07:00:00 via INTRAVENOUS

## 2018-06-26 MED ORDER — ONDANSETRON HCL 4 MG/2ML IJ SOLN
INTRAMUSCULAR | Status: AC
Start: 2018-06-26 — End: ?
  Filled 2018-06-26: qty 2

## 2018-06-26 MED ORDER — LOSARTAN POTASSIUM 50 MG PO TABS
100.0000 mg | ORAL_TABLET | Freq: Every day | ORAL | Status: DC
  Administered 2018-06-26 – 2018-06-28 (×3): 100 mg via ORAL
  Filled 2018-06-26 (×3): qty 2

## 2018-06-26 MED ORDER — ONDANSETRON HCL 4 MG PO TABS: 4.0000 mg | ORAL_TABLET | Freq: Four times a day (QID) | ORAL | Status: DC | PRN

## 2018-06-26 MED ORDER — FLUMAZENIL 0.5 MG/5ML IV SOLN
INTRAVENOUS | Status: AC
  Filled 2018-06-26: qty 5

## 2018-06-26 MED ORDER — PROPOFOL 10 MG/ML IV BOLUS
INTRAVENOUS | Status: DC | PRN
  Administered 2018-06-26: 100 mg via INTRAVENOUS
  Administered 2018-06-26: 30 mg via INTRAVENOUS

## 2018-06-26 MED ORDER — CHLORHEXIDINE GLUCONATE CLOTH 2 % EX PADS: 6.0000 | MEDICATED_PAD | Freq: Once | CUTANEOUS | Status: DC

## 2018-06-26 MED ORDER — SUGAMMADEX SODIUM 200 MG/2ML IV SOLN
INTRAVENOUS | Status: DC | PRN
  Administered 2018-06-26: 200 mg via INTRAVENOUS

## 2018-06-26 MED ORDER — FENTANYL CITRATE (PF) 100 MCG/2ML IJ SOLN
INTRAMUSCULAR | Status: DC | PRN
  Administered 2018-06-26 (×3): 50 ug via INTRAVENOUS

## 2018-06-26 MED ORDER — SENNOSIDES-DOCUSATE SODIUM 8.6-50 MG PO TABS: 1.0000 | ORAL_TABLET | Freq: Every evening | ORAL | Status: DC | PRN

## 2018-06-26 MED ORDER — BUPIVACAINE-EPINEPHRINE (PF) 0.25% -1:200000 IJ SOLN
INTRAMUSCULAR | Status: AC
  Filled 2018-06-26: qty 30

## 2018-06-26 MED ORDER — PHENYLEPHRINE HCL 10 MG/ML IJ SOLN
INTRAMUSCULAR | Status: DC | PRN
  Administered 2018-06-26: 100 ug via INTRAVENOUS

## 2018-06-26 MED ORDER — MIDAZOLAM HCL 2 MG/2ML IJ SOLN
INTRAMUSCULAR | Status: AC
  Filled 2018-06-26: qty 2

## 2018-06-26 MED ORDER — ACETAMINOPHEN 500 MG PO TABS
ORAL_TABLET | ORAL | Status: AC
  Administered 2018-06-26: 1000 mg via ORAL
  Filled 2018-06-26: qty 2

## 2018-06-26 MED ORDER — IBUPROFEN 800 MG PO TABS: 800.0000 mg | ORAL_TABLET | Freq: Three times a day (TID) | ORAL | 0 refills | Status: DC | PRN

## 2018-06-26 MED ORDER — HYDRALAZINE HCL 50 MG PO TABS
50.0000 mg | ORAL_TABLET | Freq: Three times a day (TID) | ORAL | Status: DC
  Administered 2018-06-26 – 2018-06-28 (×6): 50 mg via ORAL
  Filled 2018-06-26 (×6): qty 1

## 2018-06-26 MED ORDER — FENTANYL CITRATE (PF) 100 MCG/2ML IJ SOLN
INTRAMUSCULAR | Status: AC
Start: 2018-06-26 — End: ?
  Filled 2018-06-26: qty 2

## 2018-06-26 MED ORDER — ROCURONIUM BROMIDE 50 MG/5ML IV SOLN
INTRAVENOUS | Status: AC
  Filled 2018-06-26: qty 1

## 2018-06-26 MED ORDER — SODIUM CHLORIDE 0.9 % IV SOLN: 250.0000 mL | INTRAVENOUS | Status: DC | PRN

## 2018-06-26 MED ORDER — DEXAMETHASONE SODIUM PHOSPHATE 10 MG/ML IJ SOLN
INTRAMUSCULAR | Status: DC | PRN
  Administered 2018-06-26: 4 mg via INTRAVENOUS

## 2018-06-26 MED ORDER — BISACODYL 5 MG PO TBEC
5.0000 mg | DELAYED_RELEASE_TABLET | Freq: Every day | ORAL | Status: DC | PRN
Start: 2018-06-26 — End: 2018-06-28

## 2018-06-26 MED ORDER — LIDOCAINE HCL (PF) 1 % IJ SOLN
INTRAMUSCULAR | Status: AC
  Filled 2018-06-26: qty 30

## 2018-06-26 MED ORDER — ONDANSETRON HCL 4 MG/2ML IJ SOLN: 4.0000 mg | Freq: Once | INTRAMUSCULAR | Status: DC | PRN

## 2018-06-26 MED ORDER — IBUPROFEN 400 MG PO TABS
800.0000 mg | ORAL_TABLET | Freq: Three times a day (TID) | ORAL | Status: DC | PRN
  Administered 2018-06-26: 800 mg via ORAL
  Filled 2018-06-26: qty 1
  Filled 2018-06-26: qty 2

## 2018-06-26 SURGICAL SUPPLY — 40 items
APPLIER CLIP ROT 10 11.4 M/L (STAPLE)
BANDAGE ADH SHEER 1  50/CT (GAUZE/BANDAGES/DRESSINGS) ×8 IMPLANT
BENZOIN TINCTURE PRP APPL 2/3 (GAUZE/BANDAGES/DRESSINGS) ×2 IMPLANT
BULB RESERV EVAC DRAIN JP 100C (MISCELLANEOUS) IMPLANT
CATH CHOLANG 76X19 KUMAR (CATHETERS) ×2 IMPLANT
CHLORAPREP W/TINT 26ML (MISCELLANEOUS) ×2 IMPLANT
CLIP APPLIE ROT 10 11.4 M/L (STAPLE) IMPLANT
CLIP VESOLOCK LG 6/CT PURPLE (CLIP) IMPLANT
CLIP VESOLOCK MED LG 6/CT (CLIP) IMPLANT
CORD BIP STRL DISP 12FT (MISCELLANEOUS) ×2 IMPLANT
COVER LIGHT HANDLE STERIS (MISCELLANEOUS) ×2 IMPLANT
COVER MAYO STAND STRL (DRAPES) ×2 IMPLANT
DECANTER SPIKE VIAL GLASS SM (MISCELLANEOUS) ×2 IMPLANT
DERMABOND ADVANCED (GAUZE/BANDAGES/DRESSINGS)
DERMABOND ADVANCED .7 DNX12 (GAUZE/BANDAGES/DRESSINGS) IMPLANT
DRAIN CHANNEL 19F RND (DRAIN) IMPLANT
DRAPE C-ARM 42X72 X-RAY (DRAPES) IMPLANT
DRAPE UTILITY 15X26 TOWEL STRL (DRAPES) IMPLANT
GLOVE BIOGEL PI IND STRL 7.0 (GLOVE) ×1 IMPLANT
GLOVE BIOGEL PI INDICATOR 7.0 (GLOVE) ×1
GLOVE SURG SS PI 7.0 STRL IVOR (GLOVE) ×2 IMPLANT
GOWN STRL REUS W/TWL LRG LVL3 (GOWN DISPOSABLE) ×2 IMPLANT
GOWN STRL REUS W/TWL XL LVL3 (GOWN DISPOSABLE) ×4 IMPLANT
GRASPER SUT TROCAR 14GX15 (MISCELLANEOUS) IMPLANT
PACK BASIN MAJOR ARMC (MISCELLANEOUS) ×2 IMPLANT
PACK LAP CHOLECYSTECTOMY (MISCELLANEOUS) ×2 IMPLANT
POUCH SPECIMEN RETRIEVAL 10MM (ENDOMECHANICALS) ×2 IMPLANT
SCISSORS METZENBAUM CVD 33 (INSTRUMENTS) ×2 IMPLANT
SET IRRIG TUBING LAPAROSCOPIC (IRRIGATION / IRRIGATOR) ×2 IMPLANT
SHEARS HARMONIC ACE PLUS 36CM (ENDOMECHANICALS) IMPLANT
SLEEVE ENDOPATH XCEL 5M (ENDOMECHANICALS) ×4 IMPLANT
STOPCOCK 4 WAY LG BORE MALE ST (IV SETS) IMPLANT
STRIP CLOSURE SKIN 1/2X4 (GAUZE/BANDAGES/DRESSINGS) ×2 IMPLANT
SUT ETHILON 2 0 PS N (SUTURE) IMPLANT
SUT MNCRL AB 4-0 PS2 18 (SUTURE) ×2 IMPLANT
SUT VICRYL 0 ENDOLOOP (SUTURE) IMPLANT
TOWEL OR 17X26 4PK STRL BLUE (TOWEL DISPOSABLE) ×2 IMPLANT
TROCAR XCEL NON-BLD 11X100MML (ENDOMECHANICALS) ×2 IMPLANT
TROCAR XCEL NON-BLD 5MMX100MML (ENDOMECHANICALS) ×2 IMPLANT
TUBING INSUF HEATED (TUBING) ×2 IMPLANT

## 2018-06-26 NOTE — OR Nursing (Signed)
Hospitalist Dr. Estanislado Pandy in to see patient Elaine Norris, advises will admit to observation, and discussed with Dr. Kayleen Memos.  Patient taking po's and up to bathroom for void, tolerated well.

## 2018-06-26 NOTE — Op Note (Signed)
PATIENT:  Elaine Norris  72 y.o. female  PRE-OPERATIVE DIAGNOSIS:  GALLSTONES  POST-OPERATIVE DIAGNOSIS:  GALLSTONES  PROCEDURE:  Procedure(s): LAPAROSCOPIC CHOLECYSTECTOMY   SURGEON:  Surgeon(s): Kinsinger, Arta Bruce, MD  ASSISTANT: none  ANESTHESIA:   local and general  Indications for procedure: [name] is a 72 y.o. female with symptoms of Abdominal pain consistent with gallbladder disease, Confirmed by Ultrasound.  Description of procedure: The patient was brought into the operative suite, placed supine. Anesthesia was administered with endotracheal tube. Patient was strapped in place and foot board was secured. All pressure points were offloaded by foam padding. The patient was prepped and draped in the usual sterile fashion.  A small incision was made to the right of the umbilicus. A 89mm trocar was inserted into the peritoneal cavity with optical entry. Pneumoperitoneum was applied with high flow low pressure. 2 1mm trocars were placed in the RUQ. A 54mm trocar was placed in the subxiphoid space. 30 ml marcaine was infused to the subxiphoid space and lateral upper right abdomen in the preperitoneal spaces. Next the patient was placed in reverse trendelenberg. The gallblaldder was fatty in appearance  The gallbladder was retracted cephalad and lateral. The peritoneum was reflected off the infundibulum working lateral to medial. The cystic duct and cystic artery were identified and further dissection revealed a critical view. The cystic duct and cystic artery were doubly clipped and ligated.   The gallbladder was removed off the liver bed with cautery. The Gallbladder was placed in a specimen bag. The gallbladder fossa was irrigated and hemostasis was applied with cautery. The gallbladder was removed via the 2mm trocar. No dilation was required for removal, therefore no fascial closure was performed. Pneumoperitoneum was removed, all trocar were removed. All incisions  were closed with 4-0 monocryl subcuticular stitch. The patient woke from anesthesia and was brought to PACU in stable condition. All counts were correct  Findings: mildly inflamed gallbladder  Specimen: gallbladder  Blood loss: <30 ml  Local anesthesia: 30 ml marcaine  Complications: none  PLAN OF CARE: Discharge to home after PACU  PATIENT DISPOSITION:  PACU - hemodynamically stable.  Images:      Gurney Maxin, M.D. General, Bariatric, & Minimally Invasive Surgery [facility]

## 2018-06-26 NOTE — OR Nursing (Signed)
Rm 220 assigned, 15 min notice given to floor @ 1540

## 2018-06-26 NOTE — H&P (Signed)
Elaine Norris is an 72 y.o. female.   Chief Complaint: abdominal pain HPI:  72 yo female with intermittent epigastric pain and findings consistent with chronic cholecystitis.  Past Medical History:  Diagnosis Date  . Anxiety    Related to surgery  . Arthritis    hands  . Cancer ([place]) 1970's   cervical  . Depression    Controlled  . GERD (gastroesophageal reflux disease)   . Gout   . Hyperlipidemia   . Hypertension   . Osteoporosis    feet  . Sleep apnea    CPAP    Past Surgical History:  Procedure Laterality Date  . CARDIAC CATHETERIZATION  05/2011   [facility]  . [facility]  [facility]  . [facility]IZATION  2012   [facility]  . CATARACT EXTRACTION W/PHACO Left 02/17/2018   Procedure: CATARACT EXTRACTION PHACO AND INTRAOCULAR LENS PLACEMENT ([place]) LEFT;  Surgeon: Leandrew Koyanagi, MD;  Location: [facility];  Service: Ophthalmology;  Laterality: Left;  . CATARACT EXTRACTION W/PHACO Right 03/10/2018   Procedure: CATARACT EXTRACTION PHACO AND INTRAOCULAR LENS PLACEMENT ([place]) RIGHT  COMPLICATED;  Surgeon: Leandrew Koyanagi, MD;  Location: [facility];  Service: Ophthalmology;  Laterality: Right;  NEEDS EARLY AM 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 malyuhin  . COLONOSCOPY    . MULTIPLE TOOTH EXTRACTIONS    . TONSILLECTOMY      Family History  Problem Relation Age of Onset  . Heart disease Mother   . Heart failure Father   . Breast cancer Neg Hx    Social History:  reports that she is a non-smoker but has been exposed to tobacco smoke. She has never used smokeless tobacco. She reports that she does not drink alcohol or use drugs.  Allergies:  Allergies  Allergen Reactions  . Gabapentin     Muscles jerks, weakness and difficulty swallowing - Noted in hospital stay  . Albuterol Other (See Comments)    Pt reports seizures   . Codeine Other (See Comments)    Altered mental status     Medications Prior to Admission  Medication Sig  Dispense Refill  . acetaminophen (TYLENOL) 500 MG tablet Take 500 mg by mouth every 6 (six) hours as needed.    Marland Kitchen alendronate (FOSAMAX) 70 MG tablet Take 70 mg by mouth every Saturday.     Marland Kitchen allopurinol (ZYLOPRIM) 100 MG tablet Take 100 mg by mouth at bedtime.     Marland Kitchen aspirin 81 MG tablet Take 81 mg by mouth at bedtime.     Marland Kitchen atorvastatin (LIPITOR) 40 MG tablet Take 40 mg by mouth at bedtime.     . baclofen (LIORESAL) 10 MG tablet Take 10 mg by mouth at bedtime.     . Calcium Carb-Cholecalciferol (CALCIUM 600+D3 PO) Take 1 tablet by mouth 2 (two) times daily.    . hydrALAZINE (APRESOLINE) 100 MG tablet Take 50 mg by mouth 3 (three) times daily.   6  . labetalol (NORMODYNE) 200 MG tablet Take 200 mg by mouth 2 (two) times daily.  2  . losartan-hydrochlorothiazide (HYZAAR) 100-12.5 MG tablet Take 1 tablet by mouth daily.    . Multiple Vitamin (MULTIVITAMIN WITH MINERALS) TABS tablet Take 1 tablet by mouth daily.    . pantoprazole (PROTONIX) 40 MG tablet Take 40 mg by mouth daily.    . potassium chloride (K-DUR) 10 MEQ tablet Take 10 mEq by mouth every Monday, Wednesday, and Friday. In the morning.      No results  found for this or any previous visit (from the past 48 hour(s)). No results found.  Review of Systems  Constitutional: Negative for chills and fever.  HENT: Negative for hearing loss.   Eyes: Negative for blurred vision and double vision.  Respiratory: Negative for cough and hemoptysis.   Cardiovascular: Negative for chest pain and palpitations.  Gastrointestinal: Negative for abdominal pain, nausea and vomiting.  Genitourinary: Negative for dysuria and urgency.  Musculoskeletal: Negative for myalgias and neck pain.  Skin: Negative for itching and rash.  Neurological: Negative for dizziness, tingling and headaches.  Endo/Heme/Allergies: Does not bruise/bleed easily.  Psychiatric/Behavioral: Negative for depression and suicidal ideas.    Blood pressure (!) 208/74, pulse 60,  temperature 97.8 F (36.6 C), temperature source Oral, resp. rate 16, height 5\' 5"  (1.651 m), weight 85.3 kg, SpO2 99 %. Physical Exam  Vitals reviewed. Constitutional: She is oriented to person, place, and time. She appears well-developed and well-nourished.  HENT:  Head: Normocephalic and atraumatic.  Eyes: Pupils are equal, round, and reactive to light. Conjunctivae and EOM are normal.  Neck: Normal range of motion. Neck supple.  Cardiovascular: Normal rate and regular rhythm.  Respiratory: Effort normal and breath sounds normal.  GI: Soft. Bowel sounds are normal. She exhibits no distension. There is no tenderness.  Musculoskeletal: Normal range of motion.  Neurological: She is alert and oriented to person, place, and time.  Skin: Skin is warm and dry.  Psychiatric: She has a normal mood and affect. Her behavior is normal.     Assessment/Plan 72 yo female with chronic cholecystitis, noted hypertension this morning -lap chole -planned outpatient procedure  Mickeal Skinner, MD 06/26/2018, 7:09 AM

## 2018-06-26 NOTE — Progress Notes (Signed)
Dr. Kayleen Memos called because when pt goes to sleep, pt's O2 sats drop in to the 60'2 and 50's. So pt has to be woken up and asked to take deep breaths. When pt is woken up her O2 sats go back up in the the high 90's. Acknowledged. Received. Sierra Spargo E 10:33 AM. 06/26/2018

## 2018-06-26 NOTE — Transfer of Care (Signed)
Immediate Anesthesia Transfer of Care Note  Patient: Elaine Norris  Procedure(s) Performed: LAPAROSCOPIC CHOLECYSTECTOMY (N/A )  Patient Location: PACU  Anesthesia Type:General  Level of Consciousness: drowsy and patient cooperative  Airway & Oxygen Therapy: Patient Spontanous Breathing and Patient connected to face mask oxygen  Post-op Assessment: Report given to RN and Post -op Vital signs reviewed and stable  Post vital signs: Reviewed and stable  Last Vitals:  Vitals Value Taken Time  BP 169/69 06/26/2018  8:38 AM  Temp 36.6 C 06/26/2018  8:38 AM  Pulse 67 06/26/2018  8:41 AM  Resp 11 06/26/2018  8:41 AM  SpO2 100 % 06/26/2018  8:41 AM  Vitals shown include unvalidated device data.  Last Pain:  Vitals:   06/26/18 0838  TempSrc:   PainSc: (P) Asleep         Complications: No apparent anesthesia complications

## 2018-06-26 NOTE — Anesthesia Postprocedure Evaluation (Signed)
Anesthesia Post Note  Patient: Elaine Norris  Procedure(s) Performed: LAPAROSCOPIC CHOLECYSTECTOMY (N/A )  Patient location during evaluation: PACU Anesthesia Type: General Level of consciousness: awake and alert and oriented Pain management: pain level controlled Vital Signs Assessment: post-procedure vital signs reviewed and stable Respiratory status: spontaneous breathing Cardiovascular status: blood pressure returned to baseline Anesthetic complications: no Comments: Patient had an atypical seizure-like head jerking for about 5 minutes that resolved.  This has apparently happened before X 2  Associated with the administration of albuterol and gabapentin.  We have called the hospitalist for consultation.  The patient's O2 sat was registering 94%, but was placed on 2L/M with a resultant Sat of 100%.  Patient was doing well with no residual strength effects.     Last Vitals:  Vitals:   06/26/18 1107 06/26/18 1121  BP: (!) 152/62 (!) 187/50  Pulse: 64 60  Resp: 11 14  Temp: 36.7 C   SpO2: 98% 98%    Last Pain:  Vitals:   06/26/18 1121  TempSrc: Temporal  PainSc:                  Zerina Hallinan

## 2018-06-26 NOTE — H&P (Signed)
[facility] NAME: Elaine Norris    MR#:  [id number]  DATE OF BIRTH:  May 04, 1946  DATE OF ADMISSION:  06/26/2018  PRIMARY CARE PHYSICIAN: Perrin Maltese, MD   REQUESTING/REFERRING PHYSICIAN:   CHIEF COMPLAINT: Referred for questionable seizure in the recovery room  HISTORY OF PRESENT ILLNESS: Elaine Norris  is a 72 y.o. female with a known history of anxiety disorder, arthritis, GERD, hyperlipidemia, hypertension, osteoporosis, sleep apnea had laparoscopic cholecystectomy today.  After the procedure in the recovery room patient had some tremor with a questionable appearance of a seizure.  Anesthesiologist call for hospitalist service to evaluate the patient.  Patient tolerating liquid diet well tolerating pain well.  No new episodes of any seizure or any tremors.  Patient states whenever she gets gabapentin and albuterol she gets this kind of tremors.  She did not receive these kind of medications today.  No complaints of any chest pain, shortness of breath.  PAST MEDICAL HISTORY:   Past Medical History:  Diagnosis Date  . Anxiety    Related to surgery  . Arthritis    hands  . Cancer ([place]) 1970's   cervical  . Depression    Controlled  . GERD (gastroesophageal reflux disease)   . Gout   . Hyperlipidemia   . Hypertension   . Osteoporosis    feet  . Sleep apnea    CPAP    PAST SURGICAL HISTORY:  Past Surgical History:  Procedure Laterality Date  . CARDIAC CATHETERIZATION  05/2011   [facility]  . [facility]  . [facility]ATION  [facility]   [facility]  . CATARACT EXTRACTION W/PHACO Left 02/17/2018   Procedure: CATARACT EXTRACTION PHACO AND INTRAOCULAR LENS PLACEMENT ([place]) LEFT;  Surgeon: Leandrew Koyanagi, MD;  Location: [facility];  Service: Ophthalmology;  Laterality: Left;  . CATARACT EXTRACTION W/PHACO Right 03/10/2018   Procedure: CATARACT EXTRACTION PHACO AND INTRAOCULAR LENS  PLACEMENT ([place]) RIGHT  COMPLICATED;  Surgeon: Leandrew Koyanagi, MD;  Location: [facility];  Service: Ophthalmology;  Laterality: Right;  NEEDS EARLY AM 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 malyuhin  . COLONOSCOPY    . MULTIPLE TOOTH EXTRACTIONS    . TONSILLECTOMY      SOCIAL HISTORY:  Social History   Tobacco Use  . Smoking status: Passive Smoke Exposure - Never Smoker  . Smokeless tobacco: Never Used  Substance Use Topics  . Alcohol use: No    FAMILY HISTORY:  Family History  Problem Relation Age of Onset  . Heart disease Mother   . Heart failure Father   . Breast cancer Neg Hx     DRUG ALLERGIES:  Allergies  Allergen Reactions  . Gabapentin     Muscles jerks, weakness and difficulty swallowing - Noted in hospital stay  . Albuterol Other (See Comments)    Pt reports seizures   . Codeine Other (See Comments)    Altered mental status     REVIEW OF SYSTEMS:   CONSTITUTIONAL: No fever, fatigue or weakness.  EYES: No blurred or double vision.  EARS, NOSE, AND THROAT: No tinnitus or ear pain.  RESPIRATORY: No cough, shortness of breath, wheezing or hemoptysis.  CARDIOVASCULAR: No chest pain, orthopnea, edema.  GASTROINTESTINAL: No nausea, vomiting, diarrhea or abdominal pain.  GENITOURINARY: No dysuria, hematuria.  ENDOCRINE: No polyuria, nocturia,  HEMATOLOGY: No anemia, easy bruising or bleeding SKIN: No rash or lesion. MUSCULOSKELETAL: No joint pain or arthritis.  NEUROLOGIC: No tingling, numbness, weakness.  PSYCHIATRY: No anxiety or depression.   MEDICATIONS AT HOME:  Prior to Admission medications   Medication Sig Start Date End Date Taking? Authorizing Provider  acetaminophen (TYLENOL) 500 MG tablet Take 500 mg by mouth every 6 (six) hours as needed.   Yes [provider]  alendronate (FOSAMAX) 70 MG tablet Take 70 mg by mouth every Saturday.  01/03/17  Yes [provider]  allopurinol (ZYLOPRIM) 100 MG tablet Take 100 mg by mouth  at bedtime.  01/05/17  Yes [provider]  aspirin 81 MG tablet Take 81 mg by mouth at bedtime.    Yes [provider]  atorvastatin (LIPITOR) 40 MG tablet Take 40 mg by mouth at bedtime.  12/15/16  Yes [provider]  baclofen (LIORESAL) 10 MG tablet Take 10 mg by mouth at bedtime.  06/13/17  Yes [provider]  Calcium Carb-Cholecalciferol (CALCIUM 600+D3 PO) Take 1 tablet by mouth 2 (two) times daily.   Yes [provider]  hydrALAZINE (APRESOLINE) 100 MG tablet Take 50 mg by mouth 3 (three) times daily.  12/03/16  Yes [provider]  labetalol (NORMODYNE) 200 MG tablet Take 200 mg by mouth 2 (two) times daily. 03/13/18  Yes [provider]  losartan-hydrochlorothiazide (HYZAAR) 100-12.5 MG tablet Take 1 tablet by mouth daily. 02/02/17  Yes [provider]  Multiple Vitamin (MULTIVITAMIN WITH MINERALS) TABS tablet Take 1 tablet by mouth daily.   Yes [provider]  pantoprazole (PROTONIX) 40 MG tablet Take 40 mg by mouth daily.   Yes [provider]  potassium chloride (K-DUR) 10 MEQ tablet Take 10 mEq by mouth every Monday, Wednesday, and Friday. In the morning. 11/03/16  Yes [provider]  ibuprofen (ADVIL,MOTRIN) 800 MG tablet Take 1 tablet (800 mg total) by mouth every 8 (eight) hours as needed. 06/26/18   Kinsinger, Arta Bruce, MD  traMADol (ULTRAM) 50 MG tablet Take 1 tablet (50 mg total) by mouth every 8 (eight) hours as needed for up to 30 doses. 06/26/18   Kinsinger, Arta Bruce, MD      PHYSICAL EXAMINATION:   VITAL SIGNS: Blood pressure (!) 162/55, pulse (!) 56, temperature (!) 96.8 F (36 C), temperature source Temporal, resp. rate 16, height 5\' 5"  (1.651 m), weight 85.3 kg, SpO2 100 %.  GENERAL:  72 y.o.-year-old patient lying in the bed with no acute distress.  EYES: Pupils equal, round, reactive to light and accommodation. No scleral icterus. Extraocular muscles intact.  HEENT: Head  atraumatic, normocephalic. Oropharynx and nasopharynx clear.  NECK:  Supple, no jugular venous distention. No thyroid enlargement, no tenderness.  LUNGS: Normal breath sounds bilaterally, no wheezing, rales,rhonchi or crepitation. No use of accessory muscles of respiration.  CARDIOVASCULAR: S1, S2 normal. No murmurs, rubs, or gallops.  ABDOMEN: Soft, nontender, nondistended. Bowel sounds present. No organomegaly or mass.  Laparoscopic procedure site ok EXTREMITIES: No pedal edema, cyanosis, or clubbing.  NEUROLOGIC: Cranial nerves II through XII are intact. Muscle strength 5/5 in all extremities. Sensation intact. Gait not checked.  PSYCHIATRIC: The patient is alert and oriented x 3.  SKIN: No obvious rash, lesion, or ulcer.   LABORATORY PANEL:   CBC No results for input(s): WBC, HGB, HCT, PLT, MCV, MCH, MCHC, RDW, LYMPHSABS, MONOABS, EOSABS, BASOSABS, BANDABS in the last 168 hours.  Invalid input(s): NEUTRABS, BANDSABD ------------------------------------------------------------------------------------------------------------------  Chemistries  No results for input(s): NA, K, CL, CO2, GLUCOSE, BUN, CREATININE, CALCIUM, MG, AST, ALT, ALKPHOS, BILITOT in  the last 168 hours.  Invalid input(s): GFRCGP ------------------------------------------------------------------------------------------------------------------ estimated creatinine clearance is 35.8 mL/min (A) (by C-G formula based on SCr of 1.53 mg/dL (H)). ------------------------------------------------------------------------------------------------------------------ No results for input(s): TSH, T4TOTAL, T3FREE, THYROIDAB in the last 72 hours.  Invalid input(s): FREET3   Coagulation profile No results for input(s): INR, PROTIME in the last 168 hours. ------------------------------------------------------------------------------------------------------------------- No results for input(s): DDIMER in the last 72  hours. -------------------------------------------------------------------------------------------------------------------  Cardiac Enzymes No results for input(s): CKMB, TROPONINI, MYOGLOBIN in the last 168 hours.  Invalid input(s): CK ------------------------------------------------------------------------------------------------------------------ Invalid input(s): POCBNP  ---------------------------------------------------------------------------------------------------------------  Urinalysis    Component Value Date/Time   COLORURINE STRAW (A) 06/20/2017 1733   APPEARANCEUR CLEAR (A) 06/20/2017 1733   LABSPEC 1.005 06/20/2017 1733   PHURINE 7.0 06/20/2017 1733   GLUCOSEU NEGATIVE 06/20/2017 1733   HGBUR NEGATIVE 06/20/2017 1733   BILIRUBINUR NEGATIVE 06/20/2017 1733   KETONESUR NEGATIVE 06/20/2017 1733   PROTEINUR NEGATIVE 06/20/2017 1733   NITRITE NEGATIVE 06/20/2017 1733   LEUKOCYTESUR NEGATIVE 06/20/2017 1733     RADIOLOGY: No results found.  EKG: Orders placed or performed during the hospital encounter of 06/19/18  . EKG 12-Lead  . EKG 12-Lead    IMPRESSION AND PLAN:  72 year old female patient with history of sleep apnea, GERD, hypertension, arthritis, hyperlipidemia referred by anesthesia service in the recovery room for questionable seizure after laparoscopic cholecystectomy.  -Questionable seizure/Tremor Admit patient to observation bed to monitor for any seizures Check electrolytes  -GERD Resume PPI  -Sleep apnea CPAP at bedtime  -Hypertension Resume home medications  -DVT prophylaxis subcu Lovenox daily  All the records are reviewed and case discussed with ED provider. Management plans discussed with the patient, family and they are in agreement.  CODE STATUS:Full code Code Status History    Date Active Date Inactive Code Status Order ID Comments User Context   02/25/2017 1747 02/26/2017 1809 Full Code [id number]  Bettey Costa, MD Inpatient        TOTAL TIME TAKING CARE OF THIS PATIENT: 53 minutes.    Saundra Shelling M.D on 06/26/2018 at 2:11 PM  Between 7am to 6pm - Pager - [phone]  After 6pm go to [email or web address] - password [email or web address] [facility]  Office  [phone]  CC: Primary care physician; Perrin Maltese, MD

## 2018-06-26 NOTE — Progress Notes (Signed)
Talked to patient to see if she would be able to bring her CPAP unit in. She stated that she has not worn it since April and refuses to wear our unit. RN aware.

## 2018-06-26 NOTE — Anesthesia Procedure Notes (Signed)
Procedure Name: Intubation Date/Time: 06/26/2018 7:38 AM Performed by: Rudean Hitt, CRNA Pre-anesthesia Checklist: Patient identified, Patient being monitored, Timeout performed, Emergency Drugs available and Suction available Patient Re-evaluated:Patient Re-evaluated prior to induction Oxygen Delivery Method: Circle system utilized Preoxygenation: Pre-oxygenation with 100% oxygen Induction Type: IV induction Ventilation: Mask ventilation without difficulty and Oral airway inserted - appropriate to patient size Laryngoscope Size: Mac and 3 Grade View: Grade I Tube type: Oral Tube size: 7.0 mm Number of attempts: 1 Airway Equipment and Method: Stylet Placement Confirmation: ETT inserted through vocal cords under direct vision,  positive ETCO2 and breath sounds checked- equal and bilateral Secured at: 21 cm Tube secured with: Tape Dental Injury: Teeth and Oropharynx as per pre-operative assessment

## 2018-06-26 NOTE — Anesthesia Post-op Follow-up Note (Signed)
Anesthesia QCDR form completed.        

## 2018-06-26 NOTE — OR Nursing (Signed)
Patient resting without complaints, bed assignment pending

## 2018-06-26 NOTE — Progress Notes (Signed)
Pt refused CPAP , stated she is sleeping well.

## 2018-06-26 NOTE — OR Nursing (Signed)
Patient shaking earlier, stated "I'm going to have a seizure".  Dr's Piscitello and Kayleen Memos in to see patient.  Hospitalist consult stat ordered.  Patient without tremors at 1215pm.

## 2018-06-26 NOTE — Anesthesia Preprocedure Evaluation (Signed)
Anesthesia Evaluation  Patient identified by MRN, date of birth, ID band  Reviewed: Allergy & Precautions, NPO status , Patient's Chart, lab work & pertinent test results  History of Anesthesia Complications Negative for: history of anesthetic complications  Airway Mallampati: II  TM Distance: >3 FB Neck ROM: full    Dental  (+) Upper Dentures, Lower Dentures   Pulmonary sleep apnea and Continuous Positive Airway Pressure Ventilation ,    Pulmonary exam normal        Cardiovascular Exercise Tolerance: Good hypertension, Past MI: mild 5 years ago.  Normal cardiovascular exam  echo: 2014: The cavity size was normal. Wall thickness was normal. Systolic function was normal. The estimated ejection fraction was in the range of 55% to 60%. Wall motion was normal; there were no regional wall motion abnormalities. Left ventricular diastolic function parameters were normal. - Mitral valve: Mild regurgitation. - Right ventricle: Systolic function was normal. - Pulmonary arteries: Systolic pressure was within the normal range.;  ekg: 2018: nsr;     Neuro/Psych PSYCHIATRIC DISORDERS Anxiety Depression negative neurological ROS     GI/Hepatic Neg liver ROS, GERD  Controlled,  Endo/Other  Morbid obesity (bmi=31;)  Renal/GU negative Renal ROS  negative genitourinary   Musculoskeletal  (+) Arthritis , gout   Abdominal   Peds negative pediatric ROS (+)  Hematology negative hematology ROS (+) Cervical ca : 1970s   Anesthesia Other Findings   Reproductive/Obstetrics                             Anesthesia Physical  Anesthesia Plan  ASA: II  Anesthesia Plan: General   Post-op Pain Management:    Induction: Intravenous  PONV Risk Score and Plan:   Airway Management Planned: Oral ETT  Additional Equipment:   Intra-op Plan:   Post-operative Plan: Extubation in OR  Informed  Consent: I have reviewed the patients History and Physical, chart, labs and discussed the procedure including the risks, benefits and alternatives for the proposed anesthesia with the patient or authorized representative who has indicated his/her understanding and acceptance.     Plan Discussed with: CRNA and Surgeon  Anesthesia Plan Comments:         Anesthesia Quick Evaluation

## 2018-06-27 ENCOUNTER — Observation Stay: Payer: Medicare Other

## 2018-06-27 ENCOUNTER — Encounter: Payer: Self-pay | Admitting: General Surgery

## 2018-06-27 DIAGNOSIS — R569 Unspecified convulsions: Secondary | ICD-10-CM

## 2018-06-27 DIAGNOSIS — J320 Chronic maxillary sinusitis: Secondary | ICD-10-CM | POA: Diagnosis not present

## 2018-06-27 LAB — CBC
HEMATOCRIT: 32.7 % — AB (ref 35.0–47.0)
Hemoglobin: 11.4 g/dL — ABNORMAL LOW (ref 12.0–16.0)
MCH: 31.6 pg (ref 26.0–34.0)
MCHC: 35 g/dL (ref 32.0–36.0)
MCV: 90.4 fL (ref 80.0–100.0)
PLATELETS: 176 10*3/uL (ref 150–440)
RBC: 3.62 MIL/uL — ABNORMAL LOW (ref 3.80–5.20)
RDW: 14.7 % — AB (ref 11.5–14.5)
WBC: 9.7 10*3/uL (ref 3.6–11.0)

## 2018-06-27 LAB — BASIC METABOLIC PANEL
Anion gap: 8 (ref 5–15)
BUN: 23 mg/dL (ref 8–23)
CO2: 29 mmol/L (ref 22–32)
CREATININE: 1.49 mg/dL — AB (ref 0.44–1.00)
Calcium: 9 mg/dL (ref 8.9–10.3)
Chloride: 101 mmol/L (ref 98–111)
GFR calc Af Amer: 39 mL/min — ABNORMAL LOW (ref >60)
GFR calc non Af Amer: 34 mL/min — ABNORMAL LOW (ref >60)
GLUCOSE: 119 mg/dL — AB (ref 70–99)
Potassium: 3.6 mmol/L (ref 3.5–5.1)
Sodium: 138 mmol/L (ref 135–145)

## 2018-06-27 LAB — SURGICAL PATHOLOGY

## 2018-06-27 MED ORDER — TRAMADOL HCL 50 MG PO TABS: 50.0000 mg | ORAL_TABLET | Freq: Four times a day (QID) | ORAL | Status: DC | PRN

## 2018-06-27 MED ORDER — BACLOFEN 10 MG PO TABS
10.0000 mg | ORAL_TABLET | Freq: Every evening | ORAL | Status: DC | PRN
  Filled 2018-06-27: qty 1

## 2018-06-27 NOTE — Progress Notes (Signed)
eeg completed ° °

## 2018-06-27 NOTE — Care Management Obs Status (Signed)
[facility] NOTIFICATION   Patient Details  Name: Elaine Norris MRN: [id number] Date of Birth: 1945-12-12   Medicare Observation Status Notification Given:  No(admitted obs less than 24 hours)    Beverly Sessions, RN 06/27/2018, 11:17 AM

## 2018-06-27 NOTE — Procedures (Signed)
ELECTROENCEPHALOGRAM REPORT   Patient: Elaine Norris       Room #: 220A-AA EEG No. ID: [id number] Age: 72 y.o.        Sex: female Referring Physician: Gouru Report Date:  06/27/2018        Interpreting Physician: Alexis Goodell  History: Elaine Norris is an 72 y.o. female with seizure-like activity after surgery  Medications:  Zyloprim, ASA, Lipitor, Apresoline, Microzide, Labetalol, Cozaar, Protonix, K-dur  Conditions of Recording:  This is a 21 channel routine scalp EEG performed with bipolar and monopolar montages arranged in accordance to the international 10/20 system of electrode placement. One channel was dedicated to EKG recording.  The patient is in the awake and briefly drowsy states.  Description:  The waking background activity consists of a low voltage, symmetrical, fairly well organized, 9-10 Hz alpha activity, seen from the parieto-occipital and posterior temporal regions.  Low voltage fast activity, poorly organized, is seen anteriorly and is at times superimposed on more posterior regions.  A mixture of theta and alpha rhythms are seen from the central and temporal regions. The patient drowses briefly with slowing to irregular, low voltage theta and beta activity.   Stage II sleep is not obtained. No epileptiform activity is noted.   Hyperventilation was not performed.  Intermittent photic stimulation was performed but failed to illicit any change in the tracing.   IMPRESSION: Normal electroencephalogram, awake, drowsy and with activation procedures. There are no focal lateralizing or epileptiform features.   Alexis Goodell, MD Neurology [phone] 06/27/2018, 5:20 PM

## 2018-06-27 NOTE — Consult Note (Signed)
Reason for Consult: Possible seizures Referring Physician: Nicholes Mango, MD  CC: head jerking movements  HPI: Elaine Norris is an 72 y.o. female seen in consultation with admitting physician for evaluation of possible seizures. Patient was in surgery for elective laparoscopy cholecystectomy when the event occurred. Per Anesthesia records, Patient had a witnessed atypical seizure-like head jerking movements for about 5 minutes that resolved without deficits. There was no reports of  loss of consciousness or mental status change, loss of bowel or bladder control and or injury associated with episode. No aura was witnessed. Patient state that she has had similar events twice that was associated with the administration of albuterol and gabapentin. The patient's O2 sat was registering 94%, but was placed on 2L/M with a resultant Sat of 100%.  She denies history of seizures, strokes, traumas or tumors. No family history of seizures that she is aware of.   Past Medical History:  Diagnosis Date  . Anxiety    Related to surgery  . Arthritis    hands  . Cancer ([place]) 1970's   cervical  . Depression    Controlled  . GERD (gastroesophageal reflux disease)   . Gout   . Hyperlipidemia   . Hypertension   . Osteoporosis    feet  . Sleep apnea    CPAP    Past Surgical History:  Procedure Laterality Date  . CARDIAC CATHETERIZATION  05/2011   [facility]  . [facility]  [facility]  . CARDIAC [facility]TERIZATION  2012   [facility]  . CATARACT EXTRACTION W/PHACO Left 02/17/2018   Procedure: CATARACT EXTRACTION PHACO AND INTRAOCULAR LENS PLACEMENT ([place]) LEFT;  Surgeon: Leandrew Koyanagi, MD;  Location: [facility];  Service: Ophthalmology;  Laterality: Left;  . CATARACT EXTRACTION W/PHACO Right 03/10/2018   Procedure: CATARACT EXTRACTION PHACO AND INTRAOCULAR LENS PLACEMENT ([place]) RIGHT  COMPLICATED;  Surgeon: Leandrew Koyanagi, MD;  Location: [place];  Service: Ophthalmology;   Laterality: Right;  NEEDS EARLY AM 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 maly[place]in  . CHOLECYSTECTOMY N/A 06/26/2018   Procedure: LAPAROSCOPIC CHOLECYSTECTOMY;  Surgeon: Kinsinger, Arta Bruce, MD;  Location: [facility];  Service: General;  Laterality: N/A;  . COLONOSCOPY    . MULTIPLE TOOTH EXTRACTIONS    . TONSILLECTOMY      Family History  Problem Relation Age of Onset  . Heart disease Mother   . Heart failure Father   . Breast cancer Neg Hx     Social History:  reports that she is a non-smoker but has been exposed to tobacco smoke. She has never used smokeless tobacco. She reports that she does not drink alcohol or use drugs.  Allergies  Allergen Reactions  . Gabapentin     Muscles jerks, weakness and difficulty swallowing - Noted in hospital stay  . Albuterol Other (See Comments)    Pt reports seizures   . Codeine Other (See Comments)    Altered mental status     Medications:  I have reviewed the patient's current medications. Scheduled: . allopurinol  100 mg Oral QHS  . aspirin EC  81 mg Oral QHS  . atorvastatin  40 mg Oral QHS  . enoxaparin (LOVENOX) injection  40 mg Subcutaneous Q24H  . hydrALAZINE  50 mg Oral TID  . losartan  100 mg Oral Daily   And  . hydrochlorothiazide  12.5 mg Oral Daily  . labetalol  200 mg Oral BID  . pantoprazole  40 mg Oral Daily  . potassium chloride  10 mEq Oral Q M,W,F  . sodium chloride flush  3 mL Intravenous Q12H    ROS: History obtained from the patient   General ROS: negative for - chills, fatigue, fever, night sweats, weight gain or weight loss Psychological ROS: negative for - behavioral disorder, hallucinations, memory difficulties, mood swings or suicidal ideation Ophthalmic ROS: negative for - blurry vision, double vision, eye pain or loss of vision ENT ROS: negative for - epistaxis, nasal discharge, oral lesions, sore throat, tinnitus or vertigo Allergy and Immunology ROS: negative for - hives or itchy/watery  eyes Hematological and Lymphatic ROS: negative for - bleeding problems, bruising or swollen lymph nodes Endocrine ROS: negative for - galactorrhea, hair pattern changes, polydipsia/polyuria or temperature intolerance Respiratory ROS: negative for - cough, hemoptysis, shortness of breath or wheezing Cardiovascular ROS: negative for - chest pain, dyspnea on exertion, edema or irregular heartbeat Gastrointestinal ROS: negative for - abdominal pain, diarrhea, hematemesis, nausea/vomiting or stool incontinence Genito-Urinary ROS: negative for - dysuria, hematuria, incontinence or urinary frequency/urgency Musculoskeletal ROS: negative for - joint swelling or muscular weakness Neurological ROS: as noted in HPI Dermatological ROS: negative for rash and skin lesion changes  Physical Examination:  Physical Examination: Blood pressure (!) 154/57, pulse 61, temperature 97.9 F (36.6 C), temperature source Oral, resp. rate 16, height 5\' 5"  (1.651 m), weight 85.3 kg, SpO2 96 %.  General Exam Patient looks appropriate of age, well built, nourished and appropriately groomed.  Cardiovascular Exam: S1, S2 heart sounds present Carotid exam revealed no bruit Lung exam was clear to auscultation ? Neurological Exam  Mental Status: Alert, Oriented to time, place, person and situation Attention span and concentration seemed appropriate Memory seemed OK. Intact naming, repetition, comprehension.  Followed 2 step commands - no dysarthria Fund of knowledge seemed appropriate for age and health status.  Cranial Nerves: I. Olfactory not examined II: Visual fields were full. Pupils were equal, round and reactive to light and accommodation III,IV, VI: ptosis not present, extra-ocular motions intact bilaterally V,VII: smile symmetric, facial light touch sensation normal bilaterally VIII: Finger rub was heard symmetric in both ears IX, X: Palate and uvular movements are normal and oral sensations are OK, gag  reflex deffered XI: Neck muscle strength and shoulder shrug is normal XII: midline tongue extension  Motor Exam: Tone is normal in all extremities Muscle strength in all extremities is 5/5. No abnormal movements, fasciculations or atrophy seen  Deep Tendon Reflexes: Right Biceps is 2+, Left Biceps is 2+ Right Triceps is 2+, Left Triceps is 2+ Right Brachioradialis is 2+, Left Brachioradialis is 2+ Right Knee Jerk is 2+, Left Knee Jerk is 2+ Right Ankle Jerk is 2+, Left Ankle Jerk is 2+ Right Toes are down going, Left Toes are down going  Sensory Exam: Sensations were intact to light touch in all extremities Vibration and proprioception are also intact  Co-ordination: Finger to nose is normal  Gait: Gait and station defered  Data Reviewed  Laboratory Studies:   Basic Metabolic Panel: Recent Labs  Lab 06/26/18 1642 06/27/18 0345  NA 138 138  K 4.4 3.6  CL 101 101  CO2 27 29  GLUCOSE 170* 119*  BUN 17 23  CREATININE 1.40* 1.49*  CALCIUM 9.2 9.0    Liver Function Tests: Recent Labs  Lab 06/26/18 1642  AST 58*  ALT 45*  ALKPHOS 66  BILITOT 0.6  PROT 7.1  ALBUMIN 4.1   No results for input(s): LIPASE, AMYLASE in the last 168 hours. No results for  input(s): AMMONIA in the last 168 hours.  CBC: Recent Labs  Lab 06/26/18 1642 06/27/18 0345  WBC 10.3 9.7  HGB 12.5 11.4*  HCT 36.4 32.7*  MCV 90.2 90.4  PLT 179 176    Cardiac Enzymes: No results for input(s): CKTOTAL, CKMB, CKMBINDEX, TROPONINI in the last 168 hours.  BNP: Invalid input(s): POCBNP  CBG: No results for input(s): GLUCAP in the last 168 hours.  Microbiology: No results found for this or any previous visit.  Coagulation Studies: No results for input(s): LABPROT, INR in the last 72 hours.  Urinalysis: No results for input(s): COLORURINE, LABSPEC, PHURINE, GLUCOSEU, HGBUR, BILIRUBINUR, KETONESUR, PROTEINUR, UROBILINOGEN, NITRITE, LEUKOCYTESUR in the last 168 hours.  Invalid  input(s): APPERANCEUR  Lipid Panel:  No results found for: CHOL, TRIG, HDL, CHOLHDL, VLDL, LDLCALC  HgbA1C: No results found for: HGBA1C  Urine Drug Screen:  No results found for: LABOPIA, COCAINSCRNUR, LABBENZ, AMPHETMU, THCU, LABBARB  Alcohol Level: No results for input(s): ETH in the last 168 hours.  Other results: EKG: normal EKG, normal sinus rhythm.  Imaging: No results found.  Patient seen and examined.  Clinical course and management discussed.  Necessary edits performed.  I agree with the above.  Assessment and plan of care developed and discussed below.    Assessments: 72 year old female presenting with head jerking movements without alteration of awareness concerning for focal seizure without impairment of awareness likely provoked in a patient with prior episodes but no history of seizures. No focal deficit or persistent altered mental state, fever, persistent headache, focal-onset seizure, history of acute head trauma, malignancy, immunocompromise, alcoholism, anticoagulation, or bleeding diathesis. Labs at baseline with noted decreased kidney functions, elevated LFTs, glucose levels.   Plan 1. EEG for evaluation of seizures and characterization 2. Would consider MRI brain with and without contrast, however due to decreased kidney functions, recommend IVFs to improve kidney function prior to administration of IV contrast for renal protection. 3. If above work up unremarkable, anticonvulsant therapy not indicated and further neurological work up not indicated at this time.   3. Seizure precautions     Alexis Goodell, MD Neurology [phone]   06/27/2018, 6:02 PM

## 2018-06-27 NOTE — Progress Notes (Signed)
[facility] [place] NAME: Elaine Norris    MR#:  [id number]  DATE OF BIRTH:  10-06-1946  SUBJECTIVE:  CHIEF COMPLAINT: Patient is reporting 3 episodes of seizure-like activity in the past but currently resting comfortably.  Denies any headache or blurry vision.  Tolerating diet okay  REVIEW OF SYSTEMS:  CONSTITUTIONAL: No fever, fatigue or weakness.  EYES: No blurred or double vision.  EARS, NOSE, AND THROAT: No tinnitus or ear pain.  RESPIRATORY: No cough, shortness of breath, wheezing or hemoptysis.  CARDIOVASCULAR: No chest pain, orthopnea, edema.  GASTROINTESTINAL: No nausea, vomiting, diarrhea or abdominal pain.  GENITOURINARY: No dysuria, hematuria.  ENDOCRINE: No polyuria, nocturia,  HEMATOLOGY: No anemia, easy bruising or bleeding SKIN: No rash or lesion. MUSCULOSKELETAL: No joint pain or arthritis.   NEUROLOGIC: No tingling, numbness, weakness.  PSYCHIATRY: No anxiety or depression.   DRUG ALLERGIES:   Allergies  Allergen Reactions  . Gabapentin     Muscles jerks, weakness and difficulty swallowing - Noted in hospital stay  . Albuterol Other (See Comments)    Pt reports seizures   . Codeine Other (See Comments)    Altered mental status     VITALS:  Blood pressure (!) 154/57, pulse 61, temperature 97.9 F (36.6 C), temperature source Oral, resp. rate 16, height 5\' 5"  (1.651 m), weight 85.3 kg, SpO2 96 %.  PHYSICAL EXAMINATION:  GENERAL:  72 y.o.-year-old patient lying in the bed with no acute distress.  EYES: Pupils equal, round, reactive to light and accommodation. No scleral icterus. Extraocular muscles intact.  HEENT: Head atraumatic, normocephalic. Oropharynx and nasopharynx clear.  NECK:  Supple, no jugular venous distention. No thyroid enlargement, no tenderness.  LUNGS: Normal breath sounds bilaterally, no wheezing, rales,rhonchi or crepitation. No use of accessory muscles of respiration.   CARDIOVASCULAR: S1, S2 normal. No murmurs, rubs, or gallops.  ABDOMEN: Soft, post lap cholecystectomy, nondistended. Bowel sounds present.  EXTREMITIES: No pedal edema, cyanosis, or clubbing.  NEUROLOGIC: Cranial nerves II through XII are intact. Muscle strength 5/5 in all extremities. Sensation intact. Gait not checked.  PSYCHIATRIC: The patient is alert and oriented x 3.  SKIN: No obvious rash, lesion, or ulcer.    LABORATORY PANEL:   CBC Recent Labs  Lab 06/27/18 0345  WBC 9.7  HGB 11.4*  HCT 32.7*  PLT 176   ------------------------------------------------------------------------------------------------------------------  Chemistries  Recent Labs  Lab 06/26/18 1642 06/27/18 0345  NA 138 138  K 4.4 3.6  CL 101 101  CO2 27 29  GLUCOSE 170* 119*  BUN 17 23  CREATININE 1.40* 1.49*  CALCIUM 9.2 9.0  AST 58*  --   ALT 45*  --   ALKPHOS 66  --   BILITOT 0.6  --    ------------------------------------------------------------------------------------------------------------------  Cardiac Enzymes No results for input(s): TROPONINI in the last 168 hours. ------------------------------------------------------------------------------------------------------------------  RADIOLOGY:  No results found.  EKG:   Orders placed or performed during the hospital encounter of 06/19/18  . EKG 12-Lead  . EKG 12-Lead    ASSESSMENT AND PLAN:   72 year old female patient with history of sleep apnea, GERD, hypertension, arthritis, hyperlipidemia referred by anesthesia service in the recovery room for questionable seizure after laparoscopic cholecystectomy.  -Questionable seizure/Tremor EEG Neurology consult placed  monitor for any seizures  -GERD Resume PPI  -Sleep apnea CPAP at bedtime  -Hypertension Resume home medications Cozaar and hydrochlorothiazide.  Continue labetalol.  -Status post lap cholecystectomy postop day #1 Clinically doing fine.  General  surgery to follow  -DVT prophylaxis subcu Lovenox daily     All the records are reviewed and case discussed with Care Management/Social Workerr. Management plans discussed with the patient, family and they are in agreement.  CODE STATUS: fc   TOTAL TIME TAKING CARE OF THIS PATIENT: 35  minutes.   POSSIBLE D/C IN 1  DAYS, DEPENDING ON CLINICAL CONDITION.  Note: This dictation was prepared with Dragon dictation along with smaller phrase technology. Any transcriptional errors that result from this process are unintentional.   Nicholes Mango M.D on 06/27/2018 at 1:41 PM  Between 7am to 6pm - Pager - [phone] After 6pm go to [email or web address] - password [email or web address] [facility]  Office  [phone]  CC: Primary care physician; Perrin Maltese, MD

## 2018-06-27 NOTE — Progress Notes (Signed)
Pt refused her CPAP for tonight. She stated she was breathing fine at night.  Advised pt to contact me if she changed her mind.

## 2018-06-27 NOTE — Progress Notes (Signed)
Patient ID: DOLCE SYLVIA, female   DOB: 1946/07/07, 72 y.o.   MRN: [id number] 1 Day Post-Op   Subjective: Discharge held due to seizure and planned workup.  No abd or GI C/O.  Tol diet  Objective: Vital signs in last 24 hours: Temp:  [97.8 F (36.6 C)-98 F (36.7 C)] 97.9 F (36.6 C) (08/16 1251) Pulse Rate:  [60-80] 61 (08/16 1251) Resp:  [16-20] 16 (08/16 1251) BP: (154-181)/(57-84) 154/57 (08/16 1251) SpO2:  [96 %-99 %] 96 % (08/16 1251) Last BM Date: 06/25/18  Intake/Output from previous day: 08/15 0701 - 08/16 0700 In: 1360 [P.O.:360; I.V.:1000] Out: 310 [Urine:300; Blood:10] Intake/Output this shift: Total I/O In: 480 [P.O.:480] Out: -   General appearance: alert, cooperative and no distress GI: normal findings: soft, non-tender and non distended Incision/Wound: No erythema or drainge  Lab Results:  Recent Labs    06/26/18 1642 06/27/18 0345  WBC 10.3 9.7  HGB 12.5 11.4*  HCT 36.4 32.7*  PLT 179 176   BMET Recent Labs    06/26/18 1642 06/27/18 0345  NA 138 138  K 4.4 3.6  CL 101 101  CO2 27 29  GLUCOSE 170* 119*  BUN 17 23  CREATININE 1.40* 1.49*  CALCIUM 9.2 9.0     Studies/Results: No results found.  Anti-infectives: Anti-infectives (From admission, onward)   Start     Dose/Rate Route Frequency Ordered Stop   06/26/18 0600  cefoTEtan (CEFOTAN) 2 g in sodium chloride 0.9 % 100 mL IVPB     2 g 200 mL/hr over 30 Minutes Intravenous On call to O.R. 06/25/18 2228 06/26/18 0742      Assessment/Plan: s/p Procedure(s): LAPAROSCOPIC CHOLECYSTECTOMY Doing well from surgical perspective OK for discharge when medically stable   LOS: 0 days    Edward Jolly 06/27/2018

## 2018-06-28 ENCOUNTER — Observation Stay: Payer: Medicare Other

## 2018-06-28 DIAGNOSIS — J320 Chronic maxillary sinusitis: Secondary | ICD-10-CM | POA: Diagnosis not present

## 2018-06-28 LAB — CBC
HCT: 33 % — ABNORMAL LOW (ref 35.0–47.0)
Hemoglobin: 11.9 g/dL — ABNORMAL LOW (ref 12.0–16.0)
MCH: 32.5 pg (ref 26.0–34.0)
MCHC: 36 g/dL (ref 32.0–36.0)
MCV: 90.2 fL (ref 80.0–100.0)
PLATELETS: 159 10*3/uL (ref 150–440)
RBC: 3.66 MIL/uL — ABNORMAL LOW (ref 3.80–5.20)
RDW: 15 % — AB (ref 11.5–14.5)
WBC: 6.6 10*3/uL (ref 3.6–11.0)

## 2018-06-28 LAB — COMPREHENSIVE METABOLIC PANEL
ALBUMIN: 3.6 g/dL (ref 3.5–5.0)
ALT: 39 U/L (ref 0–44)
ANION GAP: 7 (ref 5–15)
AST: 38 U/L (ref 15–41)
Alkaline Phosphatase: 52 U/L (ref 38–126)
BUN: 24 mg/dL — AB (ref 8–23)
CHLORIDE: 102 mmol/L (ref 98–111)
CO2: 31 mmol/L (ref 22–32)
Calcium: 8.8 mg/dL — ABNORMAL LOW (ref 8.9–10.3)
Creatinine, Ser: 1.33 mg/dL — ABNORMAL HIGH (ref 0.44–1.00)
GFR calc Af Amer: 45 mL/min — ABNORMAL LOW (ref >60)
GFR, EST NON AFRICAN AMERICAN: 39 mL/min — AB (ref >60)
Glucose, Bld: 108 mg/dL — ABNORMAL HIGH (ref 70–99)
Potassium: 3.5 mmol/L (ref 3.5–5.1)
Sodium: 140 mmol/L (ref 135–145)
Total Bilirubin: 0.7 mg/dL (ref 0.3–1.2)
Total Protein: 6.3 g/dL — ABNORMAL LOW (ref 6.5–8.1)

## 2018-06-28 LAB — GLUCOSE, CAPILLARY: GLUCOSE-CAPILLARY: 94 mg/dL (ref 70–99)

## 2018-06-28 MED ORDER — GADOBENATE DIMEGLUMINE 529 MG/ML IV SOLN
8.0000 mL | Freq: Once | INTRAVENOUS | Status: AC | PRN
  Administered 2018-06-28: 8 mL via INTRAVENOUS

## 2018-06-28 MED ORDER — SENNOSIDES-DOCUSATE SODIUM 8.6-50 MG PO TABS: 1.0000 | ORAL_TABLET | Freq: Every evening | ORAL | Status: DC | PRN

## 2018-06-28 MED ORDER — TRAMADOL HCL 50 MG PO TABS: 50.0000 mg | ORAL_TABLET | Freq: Four times a day (QID) | ORAL | 0 refills | Status: DC | PRN

## 2018-06-28 MED ORDER — AMOXICILLIN-POT CLAVULANATE 875-125 MG PO TABS
1.0000 | ORAL_TABLET | Freq: Two times a day (BID) | ORAL | Status: DC
  Administered 2018-06-28: 1 via ORAL
  Filled 2018-06-28: qty 1

## 2018-06-28 MED ORDER — AMOXICILLIN-POT CLAVULANATE 875-125 MG PO TABS: 1.0000 | ORAL_TABLET | Freq: Two times a day (BID) | ORAL | 0 refills | Status: DC

## 2018-06-28 NOTE — Discharge Instructions (Signed)
Follow-up with primary care physician in 3 days Follow-up with general surgery Dr. Kieth Brightly on August 23 at 12 noon Follow-up with neurology Dr. Brigitte Pulse as needed   [place]   1) The drugs that you were given will stay in your system until tomorrow so for the next 24 hours you should not:  A) Drive an automobile B) Make any legal decisions C) Drink any alcoholic beverage   2) You may resume regular meals tomorrow.  Today it is better to start with liquids and gradually work up to solid foods.  You may eat anything you prefer, but it is better to start with liquids, then soup and crackers, and gradually work up to solid foods.   3) Please notify your doctor immediately if you have any unusual bleeding, trouble breathing, redness and pain at the surgery site, drainage, fever, or pain not relieved by medication.    4) Additional Instructions:        Please contact your physician with any problems or Same Day Surgery at [phone], Monday through Friday 6 am to 4 pm, or [place] at [facility] number at [phone].

## 2018-06-28 NOTE — Progress Notes (Signed)
Patient c/o feeling off. Stated she is having a sinking feeling, hears static in her ears, head is heavy, vision is blurry off and on. Will notify Dr Margaretmary Eddy.

## 2018-06-28 NOTE — Discharge Summary (Signed)
[facility] [place] NAME: Elaine Norris    MR#:  [id number]  DATE OF BIRTH:  1946-06-23  DATE OF ADMISSION:  06/26/2018 ADMITTING PHYSICIAN: Mickeal Skinner, MD  DATE OF DISCHARGE:  8/171/9 PRIMARY CARE PHYSICIAN: Perrin Maltese, MD    ADMISSION DIAGNOSIS:  GALLSTONES  DISCHARGE DIAGNOSIS:  Right maxillary sinusitis Status post lap cholecystectomy  SECONDARY DIAGNOSIS:   Past Medical History:  Diagnosis Date  . Anxiety    Related to surgery  . Arthritis    hands  . Cancer ([place]) 1970's   cervical  . Depression    Controlled  . GERD (gastroesophageal reflux disease)   . Gout   . Hyperlipidemia   . Hypertension   . Osteoporosis    feet  . Sleep apnea    CPAP    HOSPITAL COURSE:   HISTORY OF PRESENT ILLNESS: Elaine Norris  is a 72 y.o. female with a known history of anxiety disorder, arthritis, GERD, hyperlipidemia, hypertension, osteoporosis, sleep apnea had laparoscopic cholecystectomy today.  After the procedure in the recovery room patient had some tremor with a questionable appearance of a seizure.  Anesthesiologist call for hospitalist service to evaluate the patient.  Patient tolerating liquid diet well tolerating pain well.  No new episodes of any seizure or any tremors.  Patient states whenever she gets gabapentin and albuterol she gets this kind of tremors.  She did not receive these kind of medications today.  No complaints of any chest pain, shortness of breath.  Questionable seizure/Tremor EEG normal. MRI of the brain with and without contrast normal Appreciate neurology recommendations.  No other episodes of seizures noticed.  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  Continue labetalol.  -Status post lap cholecystectomy postop day #2 Clinically doing fine.  General surgery to follow  -DVT prophylaxis subcu Lovenox daily DISCHARGE CONDITIONS:   Stable  CONSULTS OBTAINED:  Treatment Team:  Alexis Goodell, MD Kinsinger, Arta Bruce, MD   PROCEDURES lap cholecystectomy  DRUG ALLERGIES:   Allergies  Allergen Reactions  . Gabapentin     Muscles jerks, weakness and difficulty swallowing - Noted in hospital stay  . Albuterol Other (See Comments)    Pt reports seizures   . Codeine Other (See Comments)    Altered mental status     DISCHARGE MEDICATIONS:   Allergies as of 06/28/2018      Reactions   Gabapentin    Muscles jerks, weakness and difficulty swallowing - Noted in hospital stay   Albuterol Other (See Comments)   Pt reports seizures   Codeine Other (See Comments)   Altered mental status      Medication List    TAKE these medications   acetaminophen 500 MG tablet Commonly known as:  TYLENOL Take 500 mg by mouth every 6 (six) hours as needed.   alendronate 70 MG tablet Commonly known as:  FOSAMAX Take 70 mg by mouth every Saturday.   allopurinol 100 MG tablet Commonly known as:  ZYLOPRIM Take 100 mg by mouth at bedtime.   amoxicillin-clavulanate 875-125 MG tablet Commonly known as:  AUGMENTIN Take 1 tablet by mouth every 12 (twelve) hours.   aspirin 81 MG tablet Take 81 mg by mouth  at bedtime.   atorvastatin 40 MG tablet Commonly known as:  LIPITOR Take 40 mg by mouth at bedtime.   baclofen 10 MG tablet Commonly known as:  LIORESAL Take 10 mg by mouth at bedtime.   CALCIUM 600+D3 PO Take 1 tablet by mouth 2 (two) times daily.   hydrALAZINE 100 MG tablet Commonly known as:  APRESOLINE Take 50 mg by mouth 3 (three) times daily.   ibuprofen 800 MG tablet Commonly known as:  ADVIL,MOTRIN Take 1 tablet (800 mg total) by mouth every 8 (eight) hours as needed.   labetalol  200 MG tablet Commonly known as:  NORMODYNE Take 200 mg by mouth 2 (two) times daily.   losartan-hydrochlorothiazide 100-12.5 MG tablet Commonly known as:  HYZAAR Take 1 tablet by mouth daily.   multivitamin with minerals Tabs tablet Take 1 tablet by mouth daily.   pantoprazole 40 MG tablet Commonly known as:  PROTONIX Take 40 mg by mouth daily.   potassium chloride 10 MEQ tablet Commonly known as:  K-DUR Take 10 mEq by mouth every Monday, Wednesday, and Friday. In the morning.   senna-docusate 8.6-50 MG tablet Commonly known as:  Senokot-S Take 1 tablet by mouth at bedtime as needed for mild constipation.   traMADol 50 MG tablet Commonly known as:  ULTRAM Take 1 tablet (50 mg total) by mouth every 8 (eight) hours as needed for up to 30 doses.   traMADol 50 MG tablet Commonly known as:  ULTRAM Take 1 tablet (50 mg total) by mouth every 6 (six) hours as needed for moderate pain.            Discharge Care Instructions  (From admission, onward)         Start     Ordered   06/26/18 0000  Discharge wound care:    Comments:  Ok to shower tomorrow. Glue will likely peel off in 1-3 weeks. No bandage required   06/26/18 0960           DISCHARGE INSTRUCTIONS:  Follow-up with primary care physician in 3 days Follow-up with general surgery Dr. Kieth Brightly on August 23 at 12 noon Follow-up with neurology Dr. Brigitte Pulse as needed   DIET:  Cardiac diet  DISCHARGE CONDITION:  Fair  ACTIVITY:  Activity as tolerated  OXYGEN:  Home Oxygen: No.   Oxygen Delivery: room air  DISCHARGE LOCATION:  home   If you experience worsening of your admission symptoms, develop shortness of breath, life threatening emergency, suicidal or homicidal thoughts you must seek medical attention immediately by calling 911 or calling your MD immediately  if symptoms less severe.  You Must read complete instructions/literature along with all the possible adverse reactions/side effects for all  the Medicines you take and that have been prescribed to you. Take any new Medicines after you have completely understood and accpet all the possible adverse reactions/side effects.   Please note  You were cared for by a hospitalist during your hospital stay. If you have any questions about your discharge medications or the care you received while you were in the hospital after you are discharged, you can call the unit and asked to speak with the hospitalist on call if the hospitalist that took care of you is not available. Once you are discharged, your primary care physician will handle any further medical issues. Please note that NO REFILLS for any discharge medications will be authorized once you are discharged, as it is imperative that you return to your primary care  physician (or establish a relationship with a primary care physician if you do not have one) for your aftercare needs so that they can reassess your need for medications and monitor your lab values.     Today  No chief complaint on file.  Patient feels her head is full.  No other episodes of seizures  ROS:  CONSTITUTIONAL: Denies fevers, chills. Denies any fatigue, weakness.  EYES: Denies blurry vision, double vision, eye pain. EARS, NOSE, THROAT: Denies tinnitus, ear pain, hearing loss. RESPIRATORY: Denies cough, wheeze, shortness of breath.  CARDIOVASCULAR: Denies chest pain, palpitations, edema.  GASTROINTESTINAL: Denies nausea, vomiting, diarrhea, abdominal pain. Denies bright red blood per rectum. GENITOURINARY: Denies dysuria, hematuria. ENDOCRINE: Denies nocturia or thyroid problems. HEMATOLOGIC AND LYMPHATIC: Denies easy bruising or bleeding. SKIN: Denies rash or lesion. MUSCULOSKELETAL: Denies pain in neck, back, shoulder, knees, hips or arthritic symptoms.  NEUROLOGIC: Denies paralysis, paresthesias.  PSYCHIATRIC: Denies anxiety or depressive symptoms.   VITAL SIGNS:  Blood pressure (!) 160/76, pulse 62,  temperature 97.9 F (36.6 C), temperature source Oral, resp. rate 17, height 5\' 5"  (1.651 m), weight 85.3 kg, SpO2 97 %.  I/O:    Intake/Output Summary (Last 24 hours) at 06/28/2018 1339 Last data filed at 06/28/2018 1014 Gross per 24 hour  Intake 600 ml  Output 600 ml  Net 0 ml    PHYSICAL EXAMINATION:  GENERAL:  72 y.o.-year-old patient lying in the bed with no acute distress.  EYES: Pupils equal, round, reactive to light and accommodation. No scleral icterus. Extraocular muscles intact.  HEENT: Head atraumatic, normocephalic. Oropharynx and nasopharynx clear.  Right maxillary tenderness NECK:  Supple, no jugular venous distention. No thyroid enlargement, no tenderness.  LUNGS: Normal breath sounds bilaterally, no wheezing, rales,rhonchi or crepitation. No use of accessory muscles of respiration.  CARDIOVASCULAR: S1, S2 normal. No murmurs, rubs, or gallops.  ABDOMEN: Soft, non-tender, non-distended. Bowel sounds present.  EXTREMITIES: No pedal edema, cyanosis, or clubbing.  NEUROLOGIC: Cranial nerves II through XII are intact. Muscle strength 5/5 in all extremities. Sensation intact. Gait not checked.  PSYCHIATRIC: The patient is alert and oriented x 3.  SKIN: No obvious rash, lesion, or ulcer.   DATA REVIEW:   CBC Recent Labs  Lab 06/28/18 0335  WBC 6.6  HGB 11.9*  HCT 33.0*  PLT 159    Chemistries  Recent Labs  Lab 06/28/18 0335  NA 140  K 3.5  CL 102  CO2 31  GLUCOSE 108*  BUN 24*  CREATININE 1.33*  CALCIUM 8.8*  AST 38  ALT 39  ALKPHOS 52  BILITOT 0.7    Cardiac Enzymes No results for input(s): TROPONINI in the last 168 hours.  Microbiology Results  No results found for this or any previous visit.  RADIOLOGY:  Mr Jeri Cos Wo Contrast  Result Date: 06/28/2018 CLINICAL DATA:  New onset seizure EXAM: MRI HEAD WITHOUT AND WITH CONTRAST TECHNIQUE: Multiplanar, multiecho pulse sequences of the brain and surrounding structures were obtained without and  with intravenous contrast. CONTRAST:  57mL MULTIHANCE GADOBENATE DIMEGLUMINE 529 MG/ML IV SOLN COMPARISON:  CT head 02/25/2017 FINDINGS: Brain: Ventricle size and cerebral volume normal. Negative for acute infarct. No significant chronic ischemia. Negative for hemorrhage or mass. Normal enhancement. Vascular: Normal arterial flow voids Skull and upper cervical spine: Negative Sinuses/Orbits: Complete opacification right maxillary sinus with mild mucosal edema ethmoid sinuses bilaterally. Bilateral cataract surgery Other: None IMPRESSION: Normal MRI of the brain with contrast for age Sinus mucosal disease with complete opacification  of the right maxillary sinus. Electronically Signed   By: Franchot Gallo M.D.   On: 06/28/2018 12:23    EKG:   Orders placed or performed during the hospital encounter of 06/19/18  . EKG 12-Lead  . EKG 12-Lead      Management plans discussed with the patient, family and they are in agreement.  CODE STATUS:     Code Status Orders  (From admission, onward)         Start     Ordered   06/26/18 1620  Full code  Continuous     06/26/18 1619        Code Status History    Date Active Date Inactive Code Status Order ID Comments User Context   02/25/2017 1747 02/26/2017 1809 Full Code [id number]  Bettey Costa, MD Inpatient      TOTAL TIME TAKING CARE OF THIS PATIENT: 41 minutes.   Note: This dictation was prepared with Dragon dictation along with smaller phrase technology. Any transcriptional errors that result from this process are unintentional.   @[facility] @  on 06/28/2018 at 1:39 PM  Between 7am to 6pm - Pager - [phone]  After 6pm go to [email or web address] - password [email or web address]  Office  [phone]  CC: Primary care physician; Perrin Maltese, MD

## 2019-03-28 ENCOUNTER — Emergency Department
Admission: EM | Admit: 2019-03-28 | Discharge: 2019-03-29 | Disposition: A | Discharge status: Home / Self Care | Payer: Medicare Other | Attending: Emergency Medicine | Admitting: Emergency Medicine

## 2019-03-28 ENCOUNTER — Other Ambulatory Visit: Payer: Self-pay

## 2019-03-28 ENCOUNTER — Emergency Department: Payer: Medicare Other

## 2019-03-28 DIAGNOSIS — Z79899 Other long term (current) drug therapy: Secondary | ICD-10-CM | POA: Insufficient documentation

## 2019-03-28 DIAGNOSIS — I10 Essential (primary) hypertension: Secondary | ICD-10-CM | POA: Diagnosis not present

## 2019-03-28 DIAGNOSIS — Z8541 Personal history of malignant neoplasm of cervix uteri: Secondary | ICD-10-CM | POA: Insufficient documentation

## 2019-03-28 DIAGNOSIS — Z7982 Long term (current) use of aspirin: Secondary | ICD-10-CM | POA: Insufficient documentation

## 2019-03-28 DIAGNOSIS — Z7722 Contact with and (suspected) exposure to environmental tobacco smoke (acute) (chronic): Secondary | ICD-10-CM | POA: Diagnosis not present

## 2019-03-28 DIAGNOSIS — R42 Dizziness and giddiness: Secondary | ICD-10-CM | POA: Insufficient documentation

## 2019-03-28 DIAGNOSIS — R112 Nausea with vomiting, unspecified: Secondary | ICD-10-CM | POA: Insufficient documentation

## 2019-03-28 LAB — CBC WITH DIFFERENTIAL/PLATELET
Abs Immature Granulocytes: 0.02 10*3/uL (ref 0.00–0.07)
Basophils Absolute: 0 10*3/uL (ref 0.0–0.1)
Basophils Relative: 0 %
Eosinophils Absolute: 0.2 10*3/uL (ref 0.0–0.5)
Eosinophils Relative: 2 %
HCT: 34.3 % — ABNORMAL LOW (ref 36.0–46.0)
Hemoglobin: 11.1 g/dL — ABNORMAL LOW (ref 12.0–15.0)
Immature Granulocytes: 0 %
Lymphocytes Relative: 11 %
Lymphs Abs: 1 10*3/uL (ref 0.7–4.0)
MCH: 29.1 pg (ref 26.0–34.0)
MCHC: 32.4 g/dL (ref 30.0–36.0)
MCV: 89.8 fL (ref 80.0–100.0)
Monocytes Absolute: 0.4 10*3/uL (ref 0.1–1.0)
Monocytes Relative: 5 %
Neutro Abs: 7.6 10*3/uL (ref 1.7–7.7)
Neutrophils Relative %: 82 %
Platelets: 178 10*3/uL (ref 150–400)
RBC: 3.82 MIL/uL — ABNORMAL LOW (ref 3.87–5.11)
RDW: 13.7 % (ref 11.5–15.5)
WBC: 9.3 10*3/uL (ref 4.0–10.5)
nRBC: 0 % (ref 0.0–0.2)

## 2019-03-28 LAB — URINALYSIS, COMPLETE (UACMP) WITH MICROSCOPIC
Bacteria, UA: NONE SEEN
Bilirubin Urine: NEGATIVE
Glucose, UA: 50 mg/dL — AB
Hgb urine dipstick: NEGATIVE
Ketones, ur: NEGATIVE mg/dL
Leukocytes,Ua: NEGATIVE
Nitrite: NEGATIVE
Protein, ur: NEGATIVE mg/dL
Specific Gravity, Urine: 1.012 (ref 1.005–1.030)
pH: 6 (ref 5.0–8.0)

## 2019-03-28 LAB — COMPREHENSIVE METABOLIC PANEL
ALT: 23 U/L (ref 0–44)
AST: 22 U/L (ref 15–41)
Albumin: 3.8 g/dL (ref 3.5–5.0)
Alkaline Phosphatase: 77 U/L (ref 38–126)
Anion gap: 8 (ref 5–15)
BUN: 16 mg/dL (ref 8–23)
CO2: 26 mmol/L (ref 22–32)
Calcium: 9 mg/dL (ref 8.9–10.3)
Chloride: 107 mmol/L (ref 98–111)
Creatinine, Ser: 1.04 mg/dL — ABNORMAL HIGH (ref 0.44–1.00)
GFR calc Af Amer: >60 mL/min (ref >60)
GFR calc non Af Amer: 53 mL/min — ABNORMAL LOW (ref >60)
Glucose, Bld: 120 mg/dL — ABNORMAL HIGH (ref 70–99)
Potassium: 3.8 mmol/L (ref 3.5–5.1)
Sodium: 141 mmol/L (ref 135–145)
Total Bilirubin: 0.7 mg/dL (ref 0.3–1.2)
Total Protein: 6.7 g/dL (ref 6.5–8.1)

## 2019-03-28 LAB — TROPONIN I: Troponin I: 0.04 ng/mL (ref <0.03)

## 2019-03-28 MED ORDER — ONDANSETRON HCL 4 MG/2ML IJ SOLN
4.0000 mg | Freq: Once | INTRAMUSCULAR | Status: AC
  Administered 2019-03-28: 22:00:00 4 mg via INTRAVENOUS
  Filled 2019-03-28: qty 2

## 2019-03-28 MED ORDER — METOCLOPRAMIDE HCL 5 MG/ML IJ SOLN
10.0000 mg | Freq: Once | INTRAMUSCULAR | Status: AC
  Administered 2019-03-28: 10 mg via INTRAVENOUS
  Filled 2019-03-28: qty 2

## 2019-03-28 MED ORDER — HYDRALAZINE HCL 50 MG PO TABS
50.0000 mg | ORAL_TABLET | Freq: Once | ORAL | Status: DC
  Filled 2019-03-28: qty 1

## 2019-03-28 MED ORDER — MECLIZINE HCL 25 MG PO TABS
25.0000 mg | ORAL_TABLET | Freq: Once | ORAL | Status: AC
  Administered 2019-03-28: 22:00:00 25 mg via ORAL
  Filled 2019-03-28: qty 1

## 2019-03-28 MED ORDER — LABETALOL HCL 200 MG PO TABS
200.0000 mg | ORAL_TABLET | Freq: Once | ORAL | Status: DC
  Filled 2019-03-28: qty 1

## 2019-03-28 MED ORDER — HYDRALAZINE HCL 20 MG/ML IJ SOLN
10.0000 mg | Freq: Once | INTRAMUSCULAR | Status: AC
  Administered 2019-03-28: 10 mg via INTRAVENOUS
  Filled 2019-03-28: qty 1

## 2019-03-28 MED ORDER — ACETAMINOPHEN 500 MG PO TABS
1000.0000 mg | ORAL_TABLET | Freq: Once | ORAL | Status: AC
  Administered 2019-03-28: 22:00:00 1000 mg via ORAL
  Filled 2019-03-28: qty 2

## 2019-03-28 MED ORDER — SODIUM CHLORIDE 0.9 % IV BOLUS
1000.0000 mL | Freq: Once | INTRAVENOUS | Status: AC
  Administered 2019-03-28: 22:00:00 1000 mL via INTRAVENOUS

## 2019-03-28 NOTE — ED Notes (Addendum)
Pt began retching and stated she didn't think she would be able to hold down oral medications. Oral labetalol and hydralazine held.

## 2019-03-28 NOTE — ED Triage Notes (Signed)
Pt arrives to ED from home via [facility] with c/c of dizziness, nausea and vomiting. Pt states sx onset upon waking this afternoon. EMS reports transport vitals of 166/98, p 54, NSR, O2 sat 95% on room air, temp 98.4 oral, CBG 101. EMS reports several episodes of vomiting and dry heaves during eval and transport. 20G placed in R AC, Pt given 4mg  zofran and 520mL NS. Upon arrival, pt A&Ox4, NAD, no respiratory distress noted.

## 2019-03-28 NOTE — ED Provider Notes (Signed)
[facility] Emergency Department Provider Note  ____________________________________________  Time seen: Approximately 9:42 PM  I have reviewed the triage vital signs and the nursing notes.   HISTORY  Chief Complaint Dizziness and Emesis   HPI Elaine Norris is a 73 y.o. female with a history of anxiety, depression, hypertension, hyperlipidemia, sleep apnea on CPAP who presents for evaluation of dizziness.  Patient reports being her usual state of health until later this afternoon when she went down for a nap.  When she woke up she reports feeling heaviness in her eyes and head.  She felt dizzy like she was going to pass out.  She started having nausea and dry heaving.  She then vomited a few times yellow emesis.  No diarrhea or constipation, no chest pain or shortness of breath, no fever or chills, no cough, no headache, no slurred speech, no facial droop, no unilateral weakness or numbness.  Patient reports 1 similar episode in the past when she was dehydrated.  No personal or family history of stroke.  No prior history of smoking.  Past Medical History:  Diagnosis Date  . Anxiety    Related to surgery  . Arthritis    hands  . Cancer ([place]) 1970's   cervical  . Depression    Controlled  . GERD (gastroesophageal reflux disease)   . Gout   . Hyperlipidemia   . Hypertension   . Osteoporosis    feet  . Sleep apnea    CPAP    Patient Active Problem List   Diagnosis Date Noted  . Seizure ([place]) 06/26/2018  . Jerking 02/25/2017  . Chest pain 11/12/2012  . Hypertension   . Hyperlipidemia     Past Surgical History:  Procedure Laterality Date  . CARDIAC CATHETERIZATION  05/2011   [facility]  . CARDIAC CATHETERIZATION  09/2011   [facility]  . CARDIAC CATHETERIZATION  2012   [facility]  . CATARACT EXTRACTION W/PHACO Left 02/17/2018   Procedure: CATARACT EXTRACTION PHACO AND INTRAOCULAR LENS PLACEMENT ([place]) LEFT;  Surgeon: Leandrew Koyanagi, MD;  Location:  [facility];  Service: Ophthalmology;  Laterality: Left;  . CATARACT EXTRACTION W/PHACO Right 03/10/2018   Procedure: CATARACT EXTRACTION PHACO AND INTRAOCULAR LENS PLACEMENT ([place]) RIGHT  COMPLICATED;  Surgeon: Leandrew Koyanagi, MD;  Location: [facility];  Service: Ophthalmology;  Laterality: Right;  NEEDS EARLY AM 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 maly[place]in  . CHOLECYSTECTOMY N/A 06/26/2018   Procedure: LAPAROSCOPIC CHOLECYSTECTOMY;  Surgeon: Kinsinger, Arta Bruce, MD;  Location: [facility];  Service: General;  Laterality: N/A;  . COLONOSCOPY    . MULTIPLE TOOTH EXTRACTIONS    . TONSILLECTOMY      Prior to Admission medications   Medication Sig Start Date End Date Taking? Authorizing Provider  acetaminophen (TYLENOL) 500 MG tablet Take 500 mg by mouth every 6 (six) hours as needed.    [provider]  alendronate (FOSAMAX) 70 MG tablet Take 70 mg by mouth every Saturday.  01/03/17   [provider]  allopurinol (ZYLOPRIM) 100 MG tablet Take 100 mg by mouth at bedtime.  01/05/17   [provider]  amoxicillin-clavulanate (AUGMENTIN) 875-125 MG tablet Take 1 tablet by mouth every 12 (twelve) hours. 06/28/18   Nicholes Mango, MD  aspirin 81 MG tablet Take 81 mg by mouth at bedtime.     [provider]  atorvastatin (LIPITOR) 40 MG tablet Take 40 mg by mouth at bedtime.  12/15/16   [provider]  baclofen (LIORESAL)  10 MG tablet Take 10 mg by mouth at bedtime.  06/13/17   [provider]  Calcium Carb-Cholecalciferol (CALCIUM 600+D3 PO) Take 1 tablet by mouth 2 (two) times daily.    [provider]  hydrALAZINE (APRESOLINE) 100 MG tablet Take 50 mg by mouth 3 (three) times daily.  12/03/16   [provider]  ibuprofen (ADVIL,MOTRIN) 800 MG tablet Take 1 tablet (800 mg total) by mouth every 8 (eight) hours as needed. 06/26/18   Kinsinger, Arta Bruce, MD  labetalol (NORMODYNE) 200 MG tablet Take 200 mg by mouth 2 (two)  times daily. 03/13/18   [provider]  losartan-hydrochlorothiazide (HYZAAR) 100-12.5 MG tablet Take 1 tablet by mouth daily. 02/02/17   [provider]  Multiple Vitamin (MULTIVITAMIN WITH MINERALS) TABS tablet Take 1 tablet by mouth daily.    [provider]  pantoprazole (PROTONIX) 40 MG tablet Take 40 mg by mouth daily.    [provider]  potassium chloride (K-DUR) 10 MEQ tablet Take 10 mEq by mouth every Monday, Wednesday, and Friday. In the morning. 11/03/16   [provider]  senna-docusate (SENOKOT-S) 8.6-50 MG tablet Take 1 tablet by mouth at bedtime as needed for mild constipation. 06/28/18   Gouru, Illene Silver, MD  traMADol (ULTRAM) 50 MG tablet Take 1 tablet (50 mg total) by mouth every 6 (six) hours as needed for moderate pain. 06/28/18   Nicholes Mango, MD    Allergies Gabapentin; Albuterol; and Codeine  Family History  Problem Relation Age of Onset  . Heart disease Mother   . Heart failure Father   . Breast cancer Neg Hx     Social History Social History   Tobacco Use  . Smoking status: Passive Smoke Exposure - Never Smoker  . Smokeless tobacco: Never Used  Substance Use Topics  . Alcohol use: No  . Drug use: No    Review of Systems  Constitutional: Negative for fever. + dizziness Eyes: Negative for visual changes. ENT: Negative for sore throat. Neck: No neck pain  Cardiovascular: Negative for chest pain. Respiratory: Negative for shortness of breath. Gastrointestinal: Negative for abdominal pain, diarrhea. + nausea and vomiting Genitourinary: Negative for dysuria. Musculoskeletal: Negative for back pain. Skin: Negative for rash. Neurological: Negative for headaches, weakness or numbness. Psych: No SI or HI  ____________________________________________   PHYSICAL EXAM:  VITAL SIGNS: ED Triage Vitals  Enc Vitals Group     BP 03/28/19 2107 (!) 221/85     Pulse Rate 03/28/19 2107 (!) 54     Resp 03/28/19 2107 14      Temp 03/28/19 2107 98.4 F (36.9 C)     Temp Source 03/28/19 2107 Oral     SpO2 03/28/19 2107 97 %     Weight 03/28/19 2109 185 lb (83.9 kg)     Height 03/28/19 2109 5\' 5"  (1.651 m)     Head Circumference --      Peak Flow --      Pain Score 03/28/19 2109 0     Pain Loc --      Pain Edu? --      Excl. in [place]? --     Constitutional: Alert and oriented. Well appearing and in no apparent distress. HEENT:      Head: Normocephalic and atraumatic.         Eyes: Conjunctivae are normal. Sclera is non-icteric.       Mouth/Throat: Mucous membranes are moist.       Neck: Supple with no  signs of meningismus. Cardiovascular: Regular rate and rhythm. No murmurs, gallops, or rubs. 2+ symmetrical distal pulses are present in all extremities. No JVD. Respiratory: Normal respiratory effort. Lungs are clear to auscultation bilaterally. No wheezes, crackles, or rhonchi.  Gastrointestinal: Soft, non tender, and non distended with positive bowel sounds. No rebound or guarding. Musculoskeletal: Nontender with normal range of motion in all extremities. No edema, cyanosis, or erythema of extremities. Neurologic: Normal speech and language. Face is symmetric. 5-/5 on b/l LE and 5/5 on b/l UE, no dysmetria, no pronator drift, intact sensation x4.  Extraocular movements are intact with fatigable horizontal and unilateral nystagmus at end gaze. Skin: Skin is warm, dry and intact. No rash noted. Psychiatric: Mood and affect are normal. Speech and behavior are normal.  ____________________________________________   LABS (all labs ordered are listed, but only abnormal results are displayed)  Labs Reviewed  CBC WITH DIFFERENTIAL/PLATELET - Abnormal; Notable for the following components:      Result Value   RBC 3.82 (*)    Hemoglobin 11.1 (*)    HCT 34.3 (*)    All other components within normal limits  COMPREHENSIVE METABOLIC PANEL - Abnormal; Notable for the following components:   Glucose, Bld 120 (*)     Creatinine, Ser 1.04 (*)    GFR calc non Af Amer 53 (*)    All other components within normal limits  URINALYSIS, COMPLETE (UACMP) WITH MICROSCOPIC - Abnormal; Notable for the following components:   Color, Urine YELLOW (*)    APPearance CLEAR (*)    Glucose, UA 50 (*)    All other components within normal limits  TROPONIN I - Abnormal; Notable for the following components:   Troponin I 0.04 (*)    All other components within normal limits   ____________________________________________  EKG  ED ECG REPORT I, Rudene Re, the attending physician, personally viewed and interpreted this ECG.  Sinus bradycardia, rate of 53, normal intervals, normal axis, no ST elevations or depressions.  Otherwise normal EKG. ____________________________________________  RADIOLOGY  I have personally reviewed the images performed during this visit and I agree with the Radiologist's read.   Interpretation by Radiologist:  Ct Head Wo Contrast  Result Date: 03/28/2019 CLINICAL DATA:  Headache. Dizziness. EXAM: CT HEAD WITHOUT CONTRAST TECHNIQUE: Contiguous axial images were obtained from the base of the skull through the vertex without intravenous contrast. COMPARISON:  Brain MRI 06/28/2018 FINDINGS: Brain: No intracranial hemorrhage, mass effect, or midline shift. No hydrocephalus. The basilar cisterns are patent. No evidence of territorial infarct or acute ischemia. Brain volume is normal for age. No extra-axial or intracranial fluid collection. Vascular: No hyperdense vessel. Skull: No fracture or focal lesion. Sinuses/Orbits: Paranasal sinuses and mastoid air cells are clear. The visualized orbits are unremarkable. Bilateral cataract resection. Other: None. IMPRESSION: No acute intracranial abnormality. Electronically Signed   By: Keith Rake M.D.   On: 03/28/2019 23:00      ____________________________________________   PROCEDURES  Procedure(s) performed: None Procedures Critical Care  performed:  None ____________________________________________   INITIAL IMPRESSION / ASSESSMENT AND PLAN / ED COURSE   73 y.o. female with a history of anxiety, depression, hypertension, hyperlipidemia, sleep apnea on CPAP who presents for evaluation of dizziness which she describes as feeling lightheaded associated with nausea and vomiting.  Patient is neurologically intact, no obvious distress, extremely hypertensive with BP of 221/85.  She reports that she forgot to take her afternoon hydralazine and she is now due for her evening dose.  She  also did not take her evening dose of labetalol.  We will give those 2 medicines here.  EKG showing no evidence of dysrhythmia or ischemia.  Differential diagnoses including dehydration, AKI, electrolyte abnormalities, vertigo, stroke, viral illness.  Will give IV fluids, meclizine, Zofran.  Will check labs and urinalysis.  Will monitor on telemetry.    _________________________ 11:13 PM on 03/28/2019 -----------------------------------------  Head CT negative.  Patient continues to feel dizzy.  Will get an MRI.  Labs showing stable mild anemia, no AKI, no dehydration, no UTI, no electrolyte abnormalities.  Care transferred to Dr. Owens Shark.   As part of my medical decision making, I reviewed the following data within the [facility] notes reviewed and incorporated, Labs reviewed , EKG interpreted , Old EKG reviewed, Old chart reviewed, Radiograph reviewed , Notes from prior ED visits and  Controlled Substance Database    Pertinent labs & imaging results that were available during my care of the patient were reviewed by me and considered in my medical decision making (see chart for details).    ____________________________________________   FINAL CLINICAL IMPRESSION(S) / ED DIAGNOSES  Final diagnoses:  Dizziness      NEW MEDICATIONS STARTED DURING THIS VISIT:  ED Discharge Orders    None       Note:  This  document was prepared using Dragon voice recognition software and may include unintentional dictation errors.    Alfred Levins, [place], MD 03/28/19 [phone]

## 2019-03-29 MED ORDER — MECLIZINE HCL 12.5 MG PO TABS: 12.5000 mg | ORAL_TABLET | Freq: Three times a day (TID) | ORAL | 0 refills | Status: DC | PRN

## 2019-03-29 NOTE — ED Notes (Signed)
Pt assisted up to commode. 

## 2019-03-29 NOTE — ED Notes (Signed)
Pt taken to MRI by this RN.

## 2019-03-29 NOTE — ED Notes (Signed)
Pt's son called to pick pt up.

## 2019-03-29 NOTE — ED Notes (Signed)
Pt updated on MRI result turn around time.

## 2019-03-29 NOTE — ED Notes (Signed)
Pt assisted up to restroom to void, non skid socks in place. Metal removed in preparation for MRI.

## 2019-04-13 IMAGING — MR MR HEAD WO/W CM
13 series · 48 of 48 positions shown · IV contrast (multihance)
Comparison: CT head 02/25/2017

CLINICAL DATA: New onset seizure

EXAM:
MRI HEAD WITHOUT AND WITH CONTRAST
TECHNIQUE: Multiplanar, multiecho pulse sequences of the brain and surrounding
structures were obtained without and with intravenous contrast.
CONTRAST:  8mL MULTIHANCE GADOBENATE DIMEGLUMINE 529 MG/ML IV SOLN

[Series 2: T1 · sagittal · 5.0mm · 0.45mm/px · 1 of 23 slices shown (1 of 2)]
[im 1/23]
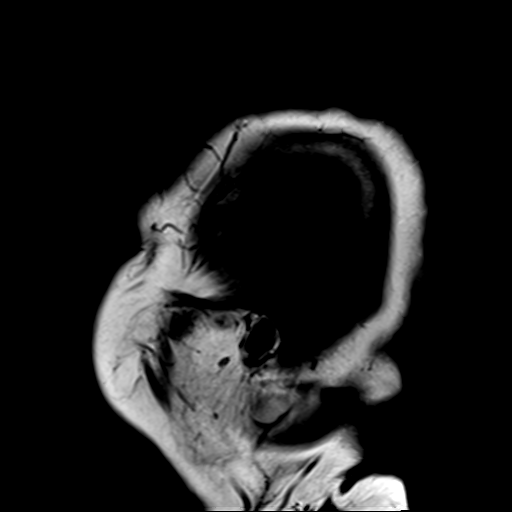

[Series 4: DWI · axial · 3.0mm · 1.80mm/px · z∈[-88,+72]mm · 3 of 54 slices shown (1 of 2)]
[im 1/54]
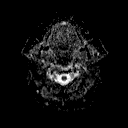
[im 27/54]
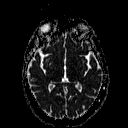
[im 54/54]
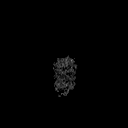

[Series 6: DWI · coronal · 3.0mm · 1.80mm/px · 3 of 45 slices shown (2 of 2)]
[im 1/45]
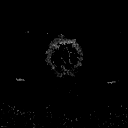
[im 23/45]
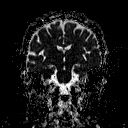
[im 45/45]
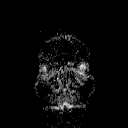

[Series 7: T2 · axial · 5.0mm · 0.60mm/px · z∈[-85,+70]mm · 2 of 25 slices shown (1 of 3)]
[im 1/25]
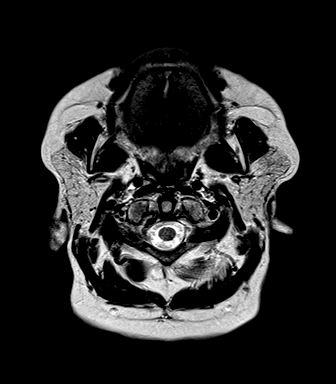
[im 25/25]
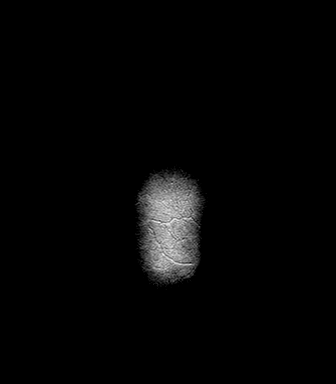

[Series 8: FLAIR · axial · 3.0mm · 0.45mm/px · z∈[-85,+70]mm · 3 of 53 slices shown]
[im 1/53]
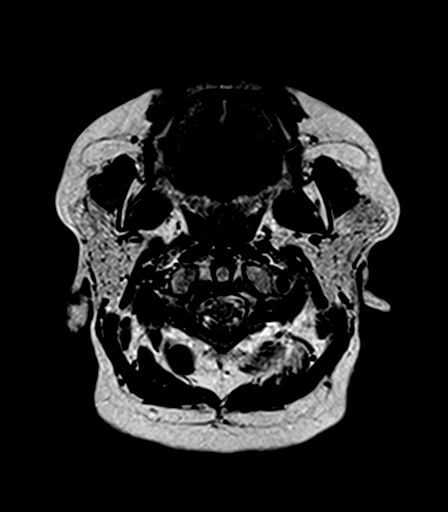
[im 27/53]
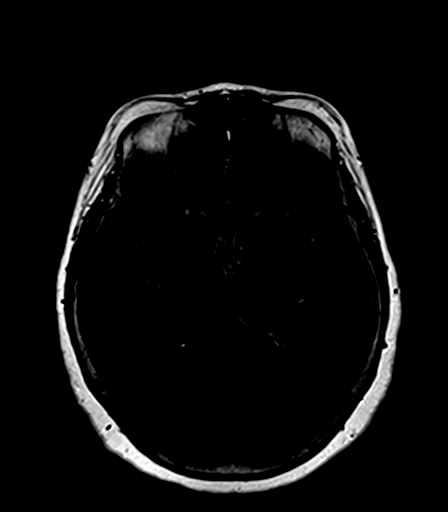
[im 53/53]
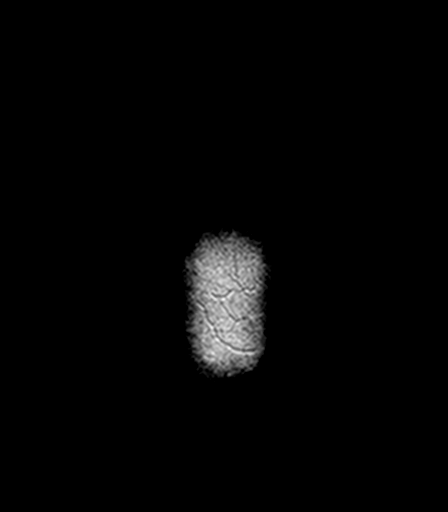

[Series 9: T2 · coronal · 4.0mm · 0.60mm/px · 2 of 31 slices shown (2 of 3)]
[im 1/31]
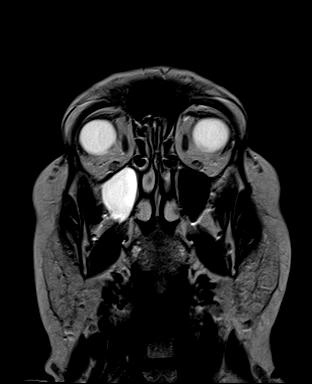
[im 31/31]
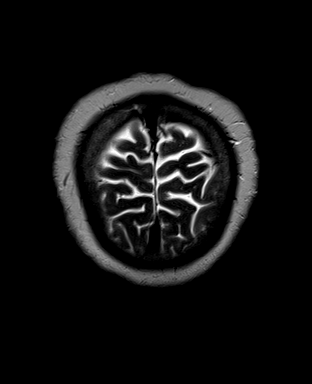

[Series 10: T2 · axial · 5.0mm · 0.45mm/px · z∈[-85,+70]mm · 2 of 25 slices shown (3 of 3)]
[im 1/25]
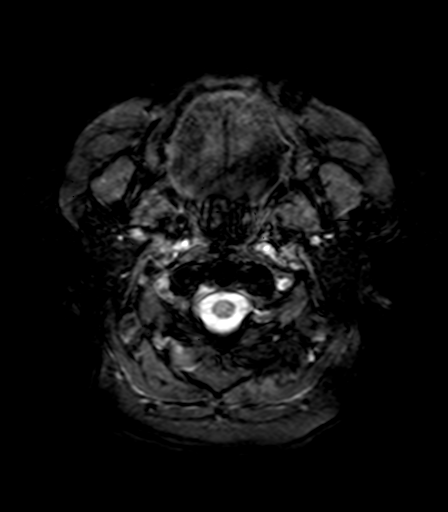
[im 25/25]
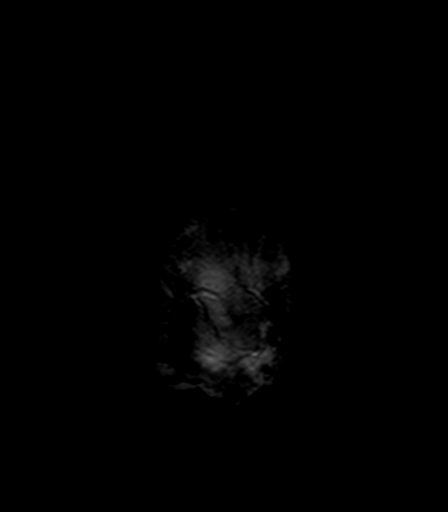

[Series 11: T1 · axial · 1.0mm · 1.00mm/px · z∈[-92,+81]mm · 11 of 176 slices shown (2 of 2)]
[im 1/176]
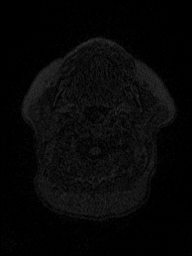
[im 18/176]
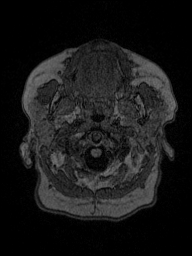
[im 36/176]
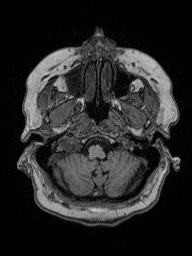
[im 53/176]
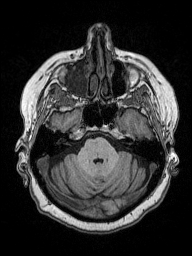
[im 71/176]
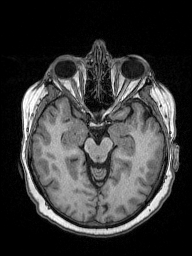
[im 88/176]
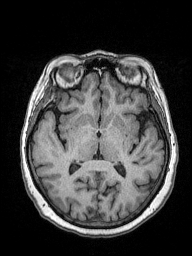
[im 106/176]
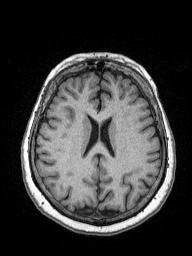
[im 123/176]
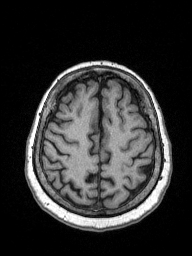
[im 141/176]
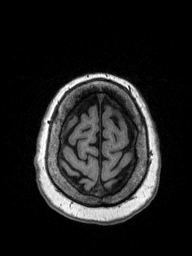
[im 158/176]
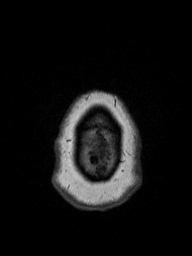
[im 176/176]
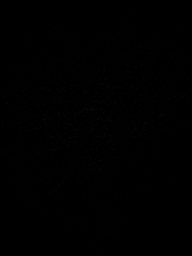

[Series 12: T2 post-contrast · coronal · 5.0mm · 0.49mm/px · 2 of 27 slices shown]
[im 1/27]
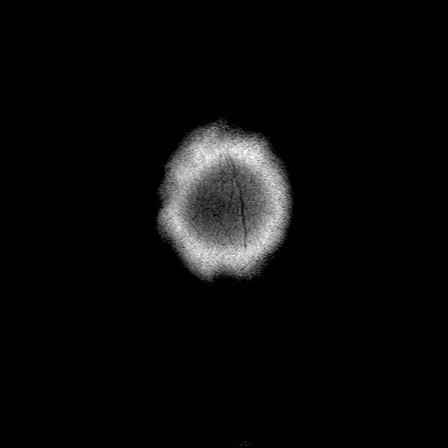
[im 27/27]
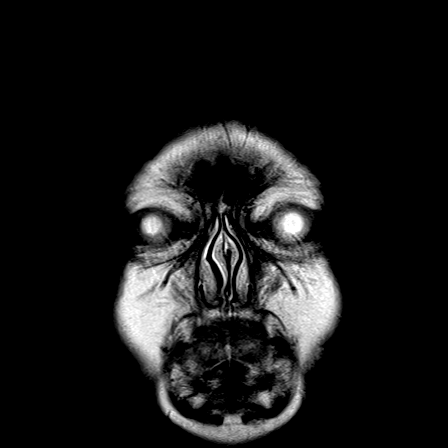

[Series 13: T1 post-contrast · axial · 1.0mm · 1.00mm/px · z∈[-92,+81]mm · 11 of 176 slices shown (1 of 2)]
[im 1/176]
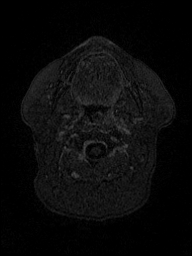
[im 18/176]
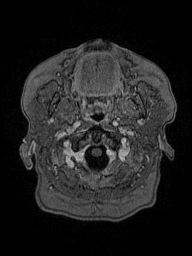
[im 36/176]
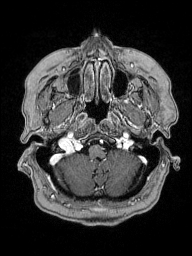
[im 53/176]
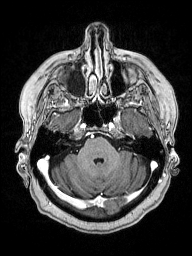
[im 71/176]
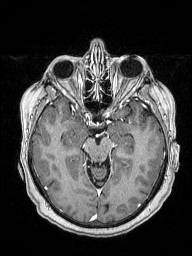
[im 88/176]
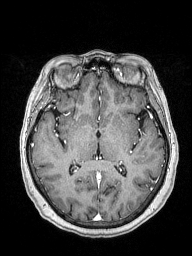
[im 106/176]
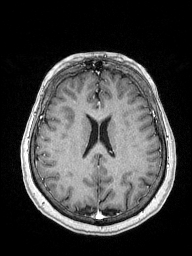
[im 123/176]
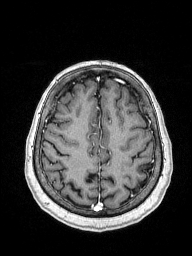
[im 141/176]
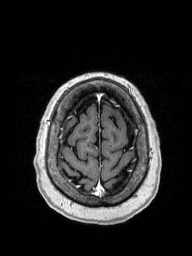
[im 158/176]
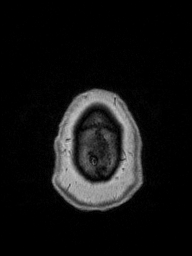
[im 176/176]
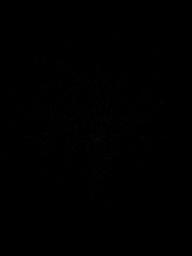

[Series 14: T1 post-contrast · coronal · 5.0mm · 0.43mm/px · 2 of 27 slices shown (2 of 2)]
[im 1/27]
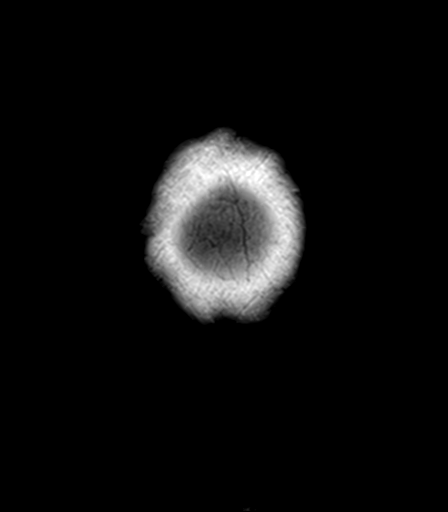
[im 27/27]
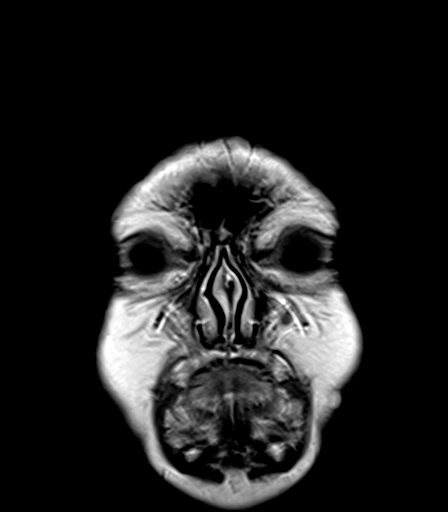

[Series 100: ax (id) · axial · 3.0mm · 1.80mm/px · z∈[-88,+72]mm · 3 of 55 slices shown]
[im 1/55]
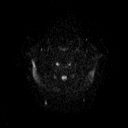
[im 28/55]
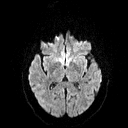
[im 55/55]
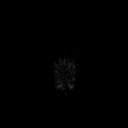

[Series 101: cor (id) · coronal · 3.0mm · 1.80mm/px · 3 of 45 slices shown]
[im 1/45]
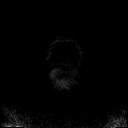
[im 23/45]
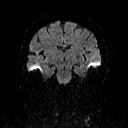
[im 45/45]
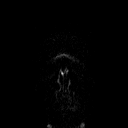

[48 of 48 positions shown; findings below may reference images not displayed]

FINDINGS: Brain: Ventricle size and cerebral volume normal. Negative for acute
infarct. No significant chronic ischemia. Negative for hemorrhage or
mass. Normal enhancement.

Vascular: Normal arterial flow voids

Skull and upper cervical spine: Negative

Sinuses/Orbits: Complete opacification right maxillary sinus with
mild mucosal edema ethmoid sinuses bilaterally. Bilateral cataract
surgery

Other: None
IMPRESSION: Normal MRI of the brain with contrast for age

Sinus mucosal disease with complete opacification of the right
maxillary sinus.

## 2019-05-07 ENCOUNTER — Other Ambulatory Visit: Payer: Self-pay | Admitting: Family

## 2019-05-07 DIAGNOSIS — Z1231 Encounter for screening mammogram for malignant neoplasm of breast: Secondary | ICD-10-CM

## 2019-06-17 ENCOUNTER — Ambulatory Visit
Admission: RE | Admit: 2019-06-17 | Discharge: 2019-06-17 | Disposition: A | Discharge status: Home / Self Care | Payer: Medicare Other | Source: Ambulatory Visit | Attending: Family | Admitting: Family

## 2019-06-17 DIAGNOSIS — Z1231 Encounter for screening mammogram for malignant neoplasm of breast: Secondary | ICD-10-CM | POA: Insufficient documentation

## 2019-06-19 ENCOUNTER — Other Ambulatory Visit: Payer: Self-pay | Admitting: Family

## 2019-06-19 DIAGNOSIS — N6489 Other specified disorders of breast: Secondary | ICD-10-CM

## 2019-06-19 DIAGNOSIS — R928 Other abnormal and inconclusive findings on diagnostic imaging of breast: Secondary | ICD-10-CM

## 2019-06-29 ENCOUNTER — Ambulatory Visit
Admission: RE | Admit: 2019-06-29 | Discharge: 2019-06-29 | Disposition: A | Discharge status: Home / Self Care | Payer: Medicare Other | Source: Ambulatory Visit | Attending: Family | Admitting: Family

## 2019-06-29 ENCOUNTER — Other Ambulatory Visit: Payer: Self-pay

## 2019-06-29 DIAGNOSIS — N6489 Other specified disorders of breast: Secondary | ICD-10-CM

## 2019-06-29 DIAGNOSIS — R928 Other abnormal and inconclusive findings on diagnostic imaging of breast: Secondary | ICD-10-CM | POA: Insufficient documentation

## 2019-06-30 ENCOUNTER — Other Ambulatory Visit: Payer: Self-pay | Admitting: Family

## 2019-06-30 DIAGNOSIS — R928 Other abnormal and inconclusive findings on diagnostic imaging of breast: Secondary | ICD-10-CM

## 2019-06-30 DIAGNOSIS — N6310 Unspecified lump in the right breast, unspecified quadrant: Secondary | ICD-10-CM

## 2019-07-21 ENCOUNTER — Encounter: Payer: Self-pay | Admitting: Emergency Medicine

## 2019-07-21 ENCOUNTER — Other Ambulatory Visit: Payer: Self-pay

## 2019-07-21 ENCOUNTER — Observation Stay
Admission: EM | Admit: 2019-07-21 | Discharge: 2019-07-22 | Disposition: A | Discharge status: Home / Self Care | Payer: Medicare Other | Attending: Internal Medicine | Admitting: Internal Medicine

## 2019-07-21 DIAGNOSIS — Z20828 Contact with and (suspected) exposure to other viral communicable diseases: Secondary | ICD-10-CM | POA: Insufficient documentation

## 2019-07-21 DIAGNOSIS — M810 Age-related osteoporosis without current pathological fracture: Secondary | ICD-10-CM | POA: Insufficient documentation

## 2019-07-21 DIAGNOSIS — T63421A Toxic effect of venom of ants, accidental (unintentional), initial encounter: Secondary | ICD-10-CM | POA: Insufficient documentation

## 2019-07-21 DIAGNOSIS — E785 Hyperlipidemia, unspecified: Secondary | ICD-10-CM | POA: Diagnosis not present

## 2019-07-21 DIAGNOSIS — Z7982 Long term (current) use of aspirin: Secondary | ICD-10-CM | POA: Insufficient documentation

## 2019-07-21 DIAGNOSIS — D72829 Elevated white blood cell count, unspecified: Secondary | ICD-10-CM | POA: Insufficient documentation

## 2019-07-21 DIAGNOSIS — M109 Gout, unspecified: Secondary | ICD-10-CM | POA: Diagnosis not present

## 2019-07-21 DIAGNOSIS — Z7722 Contact with and (suspected) exposure to environmental tobacco smoke (acute) (chronic): Secondary | ICD-10-CM | POA: Insufficient documentation

## 2019-07-21 DIAGNOSIS — R404 Transient alteration of awareness: Secondary | ICD-10-CM | POA: Insufficient documentation

## 2019-07-21 DIAGNOSIS — E860 Dehydration: Secondary | ICD-10-CM | POA: Insufficient documentation

## 2019-07-21 DIAGNOSIS — I10 Essential (primary) hypertension: Secondary | ICD-10-CM | POA: Diagnosis not present

## 2019-07-21 DIAGNOSIS — N289 Disorder of kidney and ureter, unspecified: Secondary | ICD-10-CM | POA: Diagnosis not present

## 2019-07-21 DIAGNOSIS — F419 Anxiety disorder, unspecified: Secondary | ICD-10-CM | POA: Diagnosis not present

## 2019-07-21 DIAGNOSIS — N179 Acute kidney failure, unspecified: Secondary | ICD-10-CM | POA: Insufficient documentation

## 2019-07-21 DIAGNOSIS — Z79899 Other long term (current) drug therapy: Secondary | ICD-10-CM | POA: Insufficient documentation

## 2019-07-21 DIAGNOSIS — K219 Gastro-esophageal reflux disease without esophagitis: Secondary | ICD-10-CM | POA: Insufficient documentation

## 2019-07-21 DIAGNOSIS — T782XXA Anaphylactic shock, unspecified, initial encounter: Principal | ICD-10-CM | POA: Diagnosis present

## 2019-07-21 DIAGNOSIS — X58XXXA Exposure to other specified factors, initial encounter: Secondary | ICD-10-CM | POA: Insufficient documentation

## 2019-07-21 DIAGNOSIS — G4730 Sleep apnea, unspecified: Secondary | ICD-10-CM | POA: Diagnosis not present

## 2019-07-21 DIAGNOSIS — M1990 Unspecified osteoarthritis, unspecified site: Secondary | ICD-10-CM | POA: Insufficient documentation

## 2019-07-21 LAB — BASIC METABOLIC PANEL
Anion gap: 8 (ref 5–15)
BUN: 30 mg/dL — ABNORMAL HIGH (ref 8–23)
CO2: 21 mmol/L — ABNORMAL LOW (ref 22–32)
Calcium: 8.3 mg/dL — ABNORMAL LOW (ref 8.9–10.3)
Chloride: 111 mmol/L (ref 98–111)
Creatinine, Ser: 1.81 mg/dL — ABNORMAL HIGH (ref 0.44–1.00)
GFR calc Af Amer: 32 mL/min — ABNORMAL LOW (ref >60)
GFR calc non Af Amer: 27 mL/min — ABNORMAL LOW (ref >60)
Glucose, Bld: 142 mg/dL — ABNORMAL HIGH (ref 70–99)
Potassium: 4 mmol/L (ref 3.5–5.1)
Sodium: 140 mmol/L (ref 135–145)

## 2019-07-21 LAB — CBC WITH DIFFERENTIAL/PLATELET
Abs Immature Granulocytes: 0.06 10*3/uL (ref 0.00–0.07)
Basophils Absolute: 0 10*3/uL (ref 0.0–0.1)
Basophils Relative: 0 %
Eosinophils Absolute: 0.1 10*3/uL (ref 0.0–0.5)
Eosinophils Relative: 1 %
HCT: 39.2 % (ref 36.0–46.0)
Hemoglobin: 12.8 g/dL (ref 12.0–15.0)
Immature Granulocytes: 0 %
Lymphocytes Relative: 11 %
Lymphs Abs: 1.5 10*3/uL (ref 0.7–4.0)
MCH: 29.9 pg (ref 26.0–34.0)
MCHC: 32.7 g/dL (ref 30.0–36.0)
MCV: 91.6 fL (ref 80.0–100.0)
Monocytes Absolute: 0.5 10*3/uL (ref 0.1–1.0)
Monocytes Relative: 4 %
Neutro Abs: 11.8 10*3/uL — ABNORMAL HIGH (ref 1.7–7.7)
Neutrophils Relative %: 84 %
Platelets: 208 10*3/uL (ref 150–400)
RBC: 4.28 MIL/uL (ref 3.87–5.11)
RDW: 14.2 % (ref 11.5–15.5)
WBC: 14 10*3/uL — ABNORMAL HIGH (ref 4.0–10.5)
nRBC: 0 % (ref 0.0–0.2)

## 2019-07-21 LAB — SARS CORONAVIRUS 2 BY RT PCR (HOSPITAL ORDER, PERFORMED IN ~~LOC~~ HOSPITAL LAB): SARS Coronavirus 2: NEGATIVE

## 2019-07-21 MED ORDER — FAMOTIDINE 20 MG PO TABS
20.0000 mg | ORAL_TABLET | Freq: Two times a day (BID) | ORAL | Status: DC
  Administered 2019-07-21 – 2019-07-22 (×2): 20 mg via ORAL
  Filled 2019-07-21 (×3): qty 1

## 2019-07-21 MED ORDER — LABETALOL HCL 200 MG PO TABS
200.0000 mg | ORAL_TABLET | Freq: Two times a day (BID) | ORAL | Status: DC
  Administered 2019-07-21 – 2019-07-22 (×2): 200 mg via ORAL
  Filled 2019-07-21 (×3): qty 1

## 2019-07-21 MED ORDER — ONDANSETRON HCL 4 MG/2ML IJ SOLN: 4.0000 mg | Freq: Four times a day (QID) | INTRAMUSCULAR | Status: DC | PRN

## 2019-07-21 MED ORDER — SODIUM CHLORIDE 0.9 % IV BOLUS
1000.0000 mL | Freq: Once | INTRAVENOUS | Status: AC
  Administered 2019-07-21: 14:00:00 1000 mL via INTRAVENOUS

## 2019-07-21 MED ORDER — ADULT MULTIVITAMIN W/MINERALS CH
1.0000 | ORAL_TABLET | Freq: Every day | ORAL | Status: DC
  Administered 2019-07-22: 1 via ORAL
  Filled 2019-07-21: qty 1

## 2019-07-21 MED ORDER — ACETAMINOPHEN 325 MG PO TABS: 650.0000 mg | ORAL_TABLET | Freq: Four times a day (QID) | ORAL | Status: DC | PRN

## 2019-07-21 MED ORDER — ASPIRIN EC 81 MG PO TBEC
81.0000 mg | DELAYED_RELEASE_TABLET | Freq: Every day | ORAL | Status: DC
  Administered 2019-07-21: 20:00:00 81 mg via ORAL
  Filled 2019-07-21: qty 1

## 2019-07-21 MED ORDER — ONDANSETRON HCL 4 MG PO TABS: 4.0000 mg | ORAL_TABLET | Freq: Four times a day (QID) | ORAL | Status: DC | PRN

## 2019-07-21 MED ORDER — ALLOPURINOL 100 MG PO TABS
100.0000 mg | ORAL_TABLET | Freq: Every day | ORAL | Status: DC
  Administered 2019-07-21: 20:00:00 100 mg via ORAL
  Filled 2019-07-21 (×2): qty 1

## 2019-07-21 MED ORDER — ACETAMINOPHEN 500 MG PO TABS: 500.0000 mg | ORAL_TABLET | Freq: Four times a day (QID) | ORAL | Status: DC | PRN

## 2019-07-21 MED ORDER — SENNA 8.6 MG PO TABS
1.0000 | ORAL_TABLET | Freq: Two times a day (BID) | ORAL | Status: DC
  Filled 2019-07-21: qty 1

## 2019-07-21 MED ORDER — ENOXAPARIN SODIUM 40 MG/0.4ML ~~LOC~~ SOLN
40.0000 mg | SUBCUTANEOUS | Status: DC
  Administered 2019-07-21: 40 mg via SUBCUTANEOUS
  Filled 2019-07-21: qty 0.4

## 2019-07-21 MED ORDER — ACETAMINOPHEN 650 MG RE SUPP: 650.0000 mg | Freq: Four times a day (QID) | RECTAL | Status: DC | PRN

## 2019-07-21 MED ORDER — ATORVASTATIN CALCIUM 20 MG PO TABS
40.0000 mg | ORAL_TABLET | Freq: Every day | ORAL | Status: DC
  Administered 2019-07-21: 40 mg via ORAL
  Filled 2019-07-21: qty 2

## 2019-07-21 MED ORDER — PANTOPRAZOLE SODIUM 40 MG PO TBEC
40.0000 mg | DELAYED_RELEASE_TABLET | Freq: Every day | ORAL | Status: DC
  Administered 2019-07-22: 09:00:00 40 mg via ORAL
  Filled 2019-07-21: qty 1

## 2019-07-21 MED ORDER — PREDNISONE 50 MG PO TABS
50.0000 mg | ORAL_TABLET | Freq: Every day | ORAL | Status: DC
  Administered 2019-07-22: 50 mg via ORAL
  Filled 2019-07-21: qty 1

## 2019-07-21 MED ORDER — DIPHENHYDRAMINE HCL 25 MG PO CAPS
25.0000 mg | ORAL_CAPSULE | Freq: Three times a day (TID) | ORAL | Status: DC
  Administered 2019-07-21 – 2019-07-22 (×3): 25 mg via ORAL
  Filled 2019-07-21 (×3): qty 1

## 2019-07-21 MED ORDER — SODIUM CHLORIDE 0.9 % IV SOLN
Freq: Once | INTRAVENOUS | Status: AC
  Administered 2019-07-21: 18:00:00 via INTRAVENOUS

## 2019-07-21 NOTE — ED Notes (Addendum)
ED TO INPATIENT HANDOFF REPORT  ED Nurse Name and Phone #: Levada Dy, RN  S Name/Age/Gender Elaine Norris 73 y.o. female Room/Bed: ED26A/ED26A  Code Status   Code Status: Prior  Home/SNF/Other Home Patient oriented to: self, place, time and situation Is this baseline? Yes   Triage Complete: Triage complete  Chief Complaint anaphylaxis  Triage Note Pt to ED via [facility] from home for Anaphylactic reaction. Per EMS pt was mowing the yard and ran over a hill of fire ants. Upon EMS arrival pt was unresponsive, EMS reports that pt became more responsive after receiving her 2 dose of Epi.  EMS reports that when pt is sat up she has episodes of syncope. Initial SpO2 for EMS was 88% on room air.   Pt was given 2 doses of epi 0.3 mg, Solumedrol 125 mg, Benadryl 50 mg, and Pepcid 20 mg.   Pt arrives on a non-rebreather. Pt is alert at this time and answering questions. Pt has bite marks on her legs, arms, and abdomen. Pt is A & O x 4    Allergies Allergies  Allergen Reactions  . Fire [employer]ne[employer] Anaphylaxis  . Gabapentin     Muscles jerks, weakness and difficulty swallowing - Noted in hospital stay  . Albuterol Other (See Comments)    Pt reports seizures   . Codeine Other (See Comments)    Altered mental status     Level of Care/Admitting Diagnosis ED Disposition    ED Disposition Condition [facility]: [place] [[id number]]  Level of Care: Med-Surg [16]  Covid Evaluation: N/A  Diagnosis: Anaphylaxis [149702]  Admitting Physician: Odessa Fleming  Attending Physician: Fritzi Mandes [[id number]]  PT Class (Do Not Modify): Observation [104]  PT Acc Code (Do Not Modify): Observation [10022]       B Medical/Surgery History Past Medical History:  Diagnosis Date  . Anxiety    Related to surgery  . Arthritis    hands  . Cancer ([place]) 1970's   cervical  . Depression    Controlled  . GERD (gastroesophageal reflux disease)   . Gout   .  Hyperlipidemia   . Hypertension   . Osteoporosis    feet  . Sleep apnea    CPAP   Past Surgical History:  Procedure Laterality Date  . CARDIAC CATHETERIZATION  05/2011   [facility]  . [facility]  . CARD[facility]IZATION  2012   [facility]  . CATARACT EXTRACTION W/PHACO Left 02/17/2018   Procedure: CATARACT EXTRACTION PHACO AND INTRAOCULAR LENS PLACEMENT ([place]) LEFT;  Surgeon: Leandrew Koyanagi, MD;  Location: [place];  Service: Ophthalmology;  Laterality: Left;  . CATARACT EXTRACTION W/PHACO Right 03/10/2018   Procedure: CATARACT EXTRACTION PHACO AND INTRAOCULAR LENS PLACEMENT ([place]) RIGHT  COMPLICATED;  Surgeon: Leandrew Koyanagi, MD;  Location: [place];  Service: Ophthalmology;  Laterality: Right;  NEEDS EARLY AM 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 malyuhin  . CHOLECYSTECTOMY N/A 06/26/2018   Procedure: LAPAROSCOPIC CHOLECYSTECTOMY;  Surgeon: Kinsinger, Arta Bruce, MD;  Location: [facility];  Service: General;  Laterality: N/A;  . COLONOSCOPY    . MULTIPLE TOOTH EXTRACTIONS    . TONSILLECTOMY       A IV Location/Drains/Wounds Patient Lines/Drains/Airways Status   Active Line/Drains/Airways    Name:   Placement date:   Placement time:   Site:   Days:   Peripheral IV 07/21/19 Right;Anterior Antecubital   07/21/19    1325  Antecubital   less than 1   External Urinary Catheter   07/21/19    1528    -   less than 1   Incision (Closed) 02/17/18 Eye   02/17/18    0829     519   Incision (Closed) 03/10/18 Eye Right   03/10/18    0820     498   Incision - 4 Ports Abdomen Umbilicus Lateral;Right;Upper Right;Upper Right;Lateral;Mid   06/26/18    0819     390          Intake/Output Last 24 hours  Intake/Output Summary (Last 24 hours) at 07/21/2019 1608 Last data filed at 07/21/2019 1325 Gross per 24 hour  Intake 500 ml  Output -  Net 500 ml    Labs/Imaging Results for orders placed or performed during the hospital encounter of 07/21/19 (from  the past 48 hour(s))  Basic metabolic panel     Status: Abnormal   Collection Time: 07/21/19  1:48 PM  Result Value Ref Range   Sodium 140 135 - 145 mmol/L   Potassium 4.0 3.5 - 5.1 mmol/L   Chloride 111 98 - 111 mmol/L   CO2 21 (L) 22 - 32 mmol/L   Glucose, Bld 142 (H) 70 - 99 mg/dL   BUN 30 (H) 8 - 23 mg/dL   Creatinine, Ser 1.81 (H) 0.44 - 1.00 mg/dL   Calcium 8.3 (L) 8.9 - 10.3 mg/dL   GFR calc non Af Amer 27 (L) >60 mL/min   GFR calc Af Amer 32 (L) >60 mL/min   Anion gap 8 5 - 15    Comment: Performed at [facility], [address] [place], [place] [address]  CBC with Differential     Status: Abnormal   Collection Time: 07/21/19  1:48 PM  Result Value Ref Range   WBC 14.0 (H) 4.0 - 10.5 K/uL   RBC 4.28 3.87 - 5.11 MIL/uL   Hemoglobin 12.8 12.0 - 15.0 g/dL   HCT 39.2 36.0 - 46.0 %   MCV 91.6 80.0 - 100.0 fL   MCH 29.9 26.0 - 34.0 pg   MCHC 32.7 30.0 - 36.0 g/dL   RDW 14.2 11.5 - 15.5 %   Platelets 208 150 - 400 K/uL   nRBC 0.0 0.0 - 0.2 %   Neutrophils Relative % 84 %   Neutro Abs 11.8 (H) 1.7 - 7.7 K/uL   Lymphocytes Relative 11 %   Lymphs Abs 1.5 0.7 - 4.0 K/uL   Monocytes Relative 4 %   Monocytes Absolute 0.5 0.1 - 1.0 K/uL   Eosinophils Relative 1 %   Eosinophils Absolute 0.1 0.0 - 0.5 K/uL   Basophils Relative 0 %   Basophils Absolute 0.0 0.0 - 0.1 K/uL   Immature Granulocytes 0 %   Abs Immature Granulocytes 0.06 0.00 - 0.07 K/uL    Comment: Performed at [facility], [address] [place], [place] [address]  SARS Coronavirus 2 [facility] order, Performed in [facility] hospital lab) Nasopharyngeal Nasopharyngeal Swab     Status: None   Collection Time: 07/21/19  1:48 PM   Specimen: Nasopharyngeal Swab  Result Value Ref Range   SARS Coronavirus 2 NEGATIVE NEGATIVE    Comment: (NOTE) If result is NEGATIVE SARS-CoV-2 target nucleic acids are NOT DETECTED. The SARS-CoV-2 RNA is generally detectable in upper and lower  respiratory  specimens during the acute phase of infection. The lowest  concentration of SARS-CoV-2 viral copies this assay can detect is 250  copies / mL. A negative result does not preclude SARS-CoV-2 infection  and should not be used as the sole basis for treatment or other  patient management decisions.  A negative result may occur with  improper specimen collection / handling, submission of specimen other  than nasopharyngeal swab, presence of viral mutation(s) within the  areas targeted by this assay, and inadequate number of viral copies  (<250 copies / mL). A negative result must be combined with clinical  observations, patient history, and epidemiological information. If result is POSITIVE SARS-CoV-2 target nucleic acids are DETECTED. The SARS-CoV-2 RNA is generally detectable in upper and lower  respiratory specimens dur ing the acute phase of infection.  Positive  results are indicative of active infection with SARS-CoV-2.  Clinical  correlation with patient history and other diagnostic information is  necessary to determine patient infection status.  Positive results do  not rule out bacterial infection or co-infection with other viruses. If result is PRESUMPTIVE POSTIVE SARS-CoV-2 nucleic acids MAY BE PRESENT.   A presumptive positive result was obtained on the submitted specimen  and confirmed on repeat testing.  While 2019 novel coronavirus  (SARS-CoV-2) nucleic acids may be present in the submitted sample  additional confirmatory testing may be necessary for epidemiological  and / or clinical management purposes  to differentiate between  SARS-CoV-2 and other Sarbecovirus currently known to infect humans.  If clinically indicated additional testing with an alternate test  methodology [phone]) is advised. The SARS-CoV-2 RNA is generally  detectable in upper and lower respiratory sp ecimens during the acute  phase of infection. The expected result is Negative. Fact Sheet for  Patients:  StrictlyIdeas.no Fact Sheet for Healthcare Providers: BankingDealers.co.za This test is not yet approved or cleared by the [place] FDA and has been authorized for detection and/or diagnosis of SARS-CoV-2 by FDA under an Emergency Use Authorization (EUA).  This EUA will remain in effect (meaning this test can be used) for the duration of the COVID-19 declaration under Section 564(b)(1) of the Act, 21 U.S.C. section 360bbb-3(b)(1), unless the authorization is terminated or revoked sooner. Performed at [facility], [address] [place], [place] [address]    No results found.  Pending Labs [employer] (From admission, onward)    Start     Ordered   Signed and Held  CBC  (enoxaparin (LOVENOX)    CrCl >/= 30 ml/min)  Once,   R    Comments: Baseline for enoxaparin therapy IF NOT ALREADY DRAWN.  Notify MD if PLT < 100 K.    Signed and Held   Signed and Held  Creatinine, serum  (enoxaparin (LOVENOX)    CrCl >/= 30 ml/min)  Once,   R    Comments: Baseline for enoxaparin therapy IF NOT ALREADY DRAWN.    Signed and Held   Signed and Held  Creatinine, serum  (enoxaparin (LOVENOX)    CrCl >/= 30 ml/min)  Weekly,   R    Comments: while on enoxaparin therapy    Signed and Held          Vitals/Pain Today's Vitals   07/21/19 1530 07/21/19 1540 07/21/19 1550 07/21/19 1600  BP: (!) 170/72 (!) 174/77 (!) 171/74 (!) 173/69  Pulse: (!) 58 66 64 62  Resp: 12 15 17 14   Temp:      TempSrc:      SpO2: 98% 96% 96% 96%  Weight:      Height:      PainSc:  Isolation Precautions No active isolations  Medications Medications  diphenhydrAMINE (BENADRYL) capsule 25 mg (25 mg Oral Given 07/21/19 1520)  famotidine (PEPCID) tablet 20 mg (20 mg Oral Given 07/21/19 1520)  predniSONE (DELTASONE) tablet 50 mg (has no administration in time range)  sodium chloride 0.9 % bolus 1,000 mL (0 mLs Intravenous Stopped 07/21/19  1522)    Mobility walks with person assist Low fall risk   Focused Assessment:     R Recommendations: See Admitting Provider Note  Report given to:   Additional Notes:  Pt has been weaned off O2 with SpO2 remaining between 96-98%. Pt is A & O x 4.

## 2019-07-21 NOTE — ED Notes (Signed)
Pt O2 decreased to 2 liter via [place]. Pts SpO2 currently at 100%. Admitting MD aware. RN will continue to monitor.

## 2019-07-21 NOTE — ED Notes (Signed)
Pt O2 decreased to 3 liter via [place]. SpO2 at 100% at this time. RN will continue to monitor pt.

## 2019-07-21 NOTE — ED Triage Notes (Signed)
Pt to ED via [facility] from home for Anaphylactic reaction. Per EMS pt was mowing the yard and ran over a hill of fire ants. Upon EMS arrival pt was unresponsive, EMS reports that pt became more responsive after receiving her 2 dose of Epi.  EMS reports that when pt is sat up she has episodes of syncope. Initial SpO2 for EMS was 88% on room air.   Pt was given 2 doses of epi 0.3 mg, Solumedrol 125 mg, Benadryl 50 mg, and Pepcid 20 mg.   Pt arrives on a non-rebreather. Pt is alert at this time and answering questions. Pt has bite marks on her legs, arms, and abdomen. Pt is A & O x 4

## 2019-07-21 NOTE — ED Notes (Signed)
Pt son- Corey Caulfield- [phone] (C)- Primary Contact  Gibson [phone]

## 2019-07-21 NOTE — Progress Notes (Signed)
Family Meeting Note  Advance Directive:yes Today a meeting took place with the patient in the ER patient comes in after anaphylactic reaction to fire and bite while moving her lawn this morning. She was transiently unresponsive requiring bagging for few minutes and two doses of IM epinephrine. Patient currently hemodynamically stable. Code status address patient is a full code.time spent 16 mins  Fritzi Mandes, MD

## 2019-07-21 NOTE — ED Notes (Signed)
Dr. Chauncey Cruel. Patel at bedside

## 2019-07-21 NOTE — H&P (Addendum)
[place] [facility] [place] NAME: Elaine Norris    MR#:  [id number]  DATE OF BIRTH:  1946-09-02  DATE OF ADMISSION:  07/21/2019  PRIMARY CARE PHYSICIAN: Perrin Maltese, MD   REQUESTING/REFERRING PHYSICIAN: Dr Royden Purl  CHIEF COMPLAINT:  anaphylaxis  HISTORY OF PRESENT ILLNESS:  Elaine Norris  is a 73 y.o. female with a known history of hypertension, hyperlipidemia, depression, gout comes the emergency room with possible anaphylactic reaction after she was bitten by fire ants while moving the lawn this morning. Patient was mowing the lawn this morning. She ran over hello fire ants. She felt the sting around her ankle. She could not withstand the pain went in the house EMS was called by family and on fire department arrival patient was unresponsive but help pulse. She required some bagging. She was brought into the emergency room with not rebreather on 88% sats. She received IM epinephrine times two with improvement in her mental status and respiratory status. Patient also received Solu-Medrol Benadryl and famotidine in the field. He denies any reaction to fire ants in the past. Denies any difficulty breathing chest pain. She feels a lot better. Her sats are hundred percent on room air. Blood pressure is stable. She is being admitted for overnight observation.  PAST MEDICAL HISTORY:   Past Medical History:  Diagnosis Date  . Anxiety    Related to surgery  . Arthritis    hands  . Cancer ([place]) 1970's   cervical  . Depression    Controlled  . GERD (gastroesophageal reflux disease)   . Gout   . Hyperlipidemia   . Hypertension   . Osteoporosis    feet  . Sleep apnea    CPAP    PAST SURGICAL HISTOIRY:   Past Surgical History:  Procedure Laterality Date  . CARDIAC CATHETERIZATION  05/2011   [facility]  . [facility]  . [facility]ATION  [facility]  . CATARACT EXTRACTION W/PHACO Left 02/17/2018    Procedure: CATARACT EXTRACTION PHACO AND INTRAOCULAR LENS PLACEMENT ([place]) LEFT;  Surgeon: Leandrew Koyanagi, MD;  Location: [place];  Service: Ophthalmology;  Laterality: Left;  . CATARACT EXTRACTION W/PHACO Right 03/10/2018   Procedure: CATARACT EXTRACTION PHACO AND INTRAOCULAR LENS PLACEMENT ([place]) RIGHT  COMPLICATED;  Surgeon: Leandrew Koyanagi, MD;  [facility];  Service: Ophthalmology;  Laterality: Right;  NEEDS EARLY AM 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 malyuhin  . CHOLECYSTECTOMY N/A 06/26/2018   Procedure: LAPAROSCOPIC CHOLECYSTECTOMY;  Surgeon: Kinsinger, Arta Bruce, MD;  Location: [facility];  Service: General;  Laterality: N/A;  . COLONOSCOPY    . MULTIPLE TOOTH EXTRACTIONS    . TONSILLECTOMY      SOCIAL HISTORY:   Social History   Tobacco Use  . Smoking status: Passive Smoke Exposure - Never Smoker  . Smokeless tobacco: Never Used  Substance Use Topics  . Alcohol use: No    FAMILY HISTORY:   Family History  Problem Relation Age of Onset  . Heart disease Mother   . Heart failure Father   . Breast cancer Neg Hx     DRUG ALLERGIES:   Allergies  Allergen Reactions  . Fire [employer] Anaphylaxis  . Gabapentin     Muscles jerks, weakness and difficulty swallowing - Noted in hospital stay  . Albuterol Other (See Comments)    Pt reports seizures   . Codeine Other (See Comments)    Altered mental status  REVIEW OF SYSTEMS:  Review of Systems  Constitutional: Negative for chills, fever and weight loss.  HENT: Negative for ear discharge, ear pain and nosebleeds.   Eyes: Negative for blurred vision, pain and discharge.  Respiratory: Negative for sputum production, shortness of breath, wheezing and stridor.   Cardiovascular: Negative for chest pain, palpitations, orthopnea and PND.  Gastrointestinal: Negative for abdominal pain, diarrhea, nausea and vomiting.  Genitourinary: Negative for frequency and urgency.  Musculoskeletal: Negative  for back pain and joint pain.  Skin: Positive for itching.  Neurological: Positive for weakness. Negative for sensory change, speech change and focal weakness.  Psychiatric/Behavioral: Negative for depression and hallucinations. The patient is not nervous/anxious.      MEDICATIONS AT HOME:   Prior to Admission medications   Medication Sig Start Date End Date Taking? Authorizing Provider  acetaminophen (TYLENOL) 500 MG tablet Take 500 mg by mouth every 6 (six) hours as needed.    [provider]  alendronate (FOSAMAX) 70 MG tablet Take 70 mg by mouth every Saturday.  01/03/17   [provider]  allopurinol (ZYLOPRIM) 100 MG tablet Take 100 mg by mouth at bedtime.  01/05/17   [provider]  aspirin 81 MG tablet Take 81 mg by mouth at bedtime.     [provider]  atorvastatin (LIPITOR) 40 MG tablet Take 40 mg by mouth at bedtime.  12/15/16   [provider]  baclofen (LIORESAL) 10 MG tablet Take 10 mg by mouth at bedtime.  06/13/17   [provider]  Calcium Carb-Cholecalciferol (CALCIUM 600+D3 PO) Take 1 tablet by mouth 2 (two) times daily.    [provider]  hydrALAZINE (APRESOLINE) 100 MG tablet Take 50 mg by mouth 3 (three) times daily.  12/03/16   [provider]  labetalol (NORMODYNE) 200 MG tablet Take 200 mg by mouth 2 (two) times daily. 03/13/18   [provider]  losartan-hydrochlorothiazide (HYZAAR) 100-12.5 MG tablet Take 1 tablet by mouth daily. 02/02/17   [provider]  meclizine (ANTIVERT) 12.5 MG tablet Take 1 tablet (12.5 mg total) by mouth 3 (three) times daily as needed for dizziness. 03/29/19   Gregor Hams, MD  Multiple Vitamin (MULTIVITAMIN WITH MINERALS) TABS tablet Take 1 tablet by mouth daily.    [provider]  pantoprazole (PROTONIX) 40 MG tablet Take 40 mg by mouth daily.    [provider]  potassium chloride (K-DUR) 10 MEQ tablet Take 10 mEq by mouth every  Monday, Wednesday, and Friday. In the morning. 11/03/16   [provider]      VITAL SIGNS:  Blood pressure (!) 152/64, pulse (!) 52, temperature (!) 97.2 F (36.2 C), temperature source Axillary, resp. rate 13, height 5\' 5"  (1.651 m), weight 75.8 kg, SpO2 100 %.  PHYSICAL EXAMINATION:  GENERAL:  73 y.o.-year-old patient lying in the bed with no acute distress.  EYES: Pupils equal, round, reactive to light and accommodation. No scleral icterus. Extraocular muscles intact.  HEENT: Head atraumatic, normocephalic. Oropharynx and nasopharynx clear.  NECK:  Supple, no jugular venous distention. No thyroid enlargement, no tenderness.  LUNGS: Normal breath sounds bilaterally, no wheezing, rales,rhonchi or crepitation. No use of accessory muscles of respiration.  CARDIOVASCULAR: S1, S2 normal. No murmurs, rubs, or gallops.  ABDOMEN: Soft, nontender, nondistended. Bowel sounds present. No organomegaly or mass.  EXTREMITIES: No pedal edema, cyanosis, or clubbing.  NEUROLOGIC: Cranial nerves II through XII are intact. Muscle strength 5/5 in all extremities. Sensation intact. Gait not  checked.  PSYCHIATRIC: The patient is alert and oriented x 3.  SKIN: patient has several insect/bug bite over her body. She has several scratch marks on her hands due to working out in the yard.  LABORATORY PANEL:   CBC Recent Labs  Lab 07/21/19 1348  WBC 14.0*  HGB 12.8  HCT 39.2  PLT 208   ------------------------------------------------------------------------------------------------------------------  Chemistries  Recent Labs  Lab 07/21/19 1348  NA 140  K 4.0  CL 111  CO2 21*  GLUCOSE 142*  BUN 30*  CREATININE 1.81*  CALCIUM 8.3*   ------------------------------------------------------------------------------------------------------------------  Cardiac Enzymes No results for input(s): TROPONINI in the last 168  hours. ------------------------------------------------------------------------------------------------------------------  RADIOLOGY:  No results found.  EKG:   Sinus rhythm on telemonitoring IMPRESSION AND PLAN:   Elaine Norris  is a 73 y.o. female with a known history of hypertension, hyperlipidemia, depression, gout comes the emergency room with possible anaphylactic reaction after she was bitten by fire ants while moving the lawn this morning.  1. Anaphylactic reaction to fire ant bite -admit for overnight observation -telemetry monitoring -PO Benadryl, famotidine, prednisone (can stop if no local reaction) -patient currently hemodynamically stable  2. Transient unresponsiveness without losing pulse after fire ant bite -patient appears hemodynamically stable. Currently sinus rhythm on monitor.  -Blood pressure stable  3. Hypertension resume home meds --labetalol  4. Hyperlipidemia continue statins  5. DVT prophylaxis subcu Lovenox  6. Renal insufficiency patient's baseline creatinine 1.1-1.3 -will give IV fluids overnight  All the records are reviewed and case discussed with ED provider.   CODE STATUS: full  TOTAL TIME TAKING CARE OF THIS PATIENT: *45* minutes.    Fritzi Mandes M.D on 07/21/2019 at 2:32 PM  Between 7am to 6pm - Pager - [phone]  After 6pm go to [email or web address] - password [email or web address] [facility]  [facility]  Office  [phone]  CC: Primary care physician; Perrin Maltese, MD

## 2019-07-21 NOTE — ED Notes (Signed)
Pt taken off Non-rebreather and placed on nasal cannula at 4 liters per minute. Pt reports that she is not on O2 at home. Pt is denying shortness of breath at this time. Pts lungs are clear bilaterally. RN will continue to monitor pt.   Dr. Joan Mayans aware.

## 2019-07-21 NOTE — ED Provider Notes (Signed)
[facility] Emergency Department Provider Note  ____________________________________________   First MD Initiated Contact with Patient 07/21/19 1321     (approximate)  I have reviewed the triage vital signs and the nursing notes.  History  Chief Complaint anaphylaxis    HPI Elaine Norris is a 73 y.o. female history of hypertension, hyperlipidemia, osteoporosis who presents for anaphylaxis.  Per report, patient was mowing the lawn when she ran over a hill of fire ants. EMS was called by family, and on fire department arrival she was unresponsive (had a pulse), but requiring bagging. Initial oxygen was 88%. EMS administered IM epi x 2 with improvement her her mental status and respiratory status.  Also administered Solu-Medrol, Benadryl, famotidine.  Patient denies any history of anaphylaxis, or reaction to fire ants.  She denies any difficulty breathing, nausea, vomiting, diarrhea.         Past Medical Hx Past Medical History:  Diagnosis Date  . Anxiety    Related to surgery  . Arthritis    hands  . Cancer ([place]) 1970's   cervical  . Depression    Controlled  . GERD (gastroesophageal reflux disease)   . Gout   . Hyperlipidemia   . Hypertension   . Osteoporosis    feet  . Sleep apnea    CPAP    Problem List Patient Active Problem List   Diagnosis Date Noted  . Seizure ([place]) 06/26/2018  . Jerking 02/25/2017  . Chest pain 11/12/2012  . Hypertension   . Hyperlipidemia     Past Surgical Hx Past Surgical History:  Procedure Laterality Date  . CARDIAC CATHETERIZATION  05/2011   [facility]  . [facility]  . CARDIAC CATHETERIZATION  2012   [facility]  . CATARACT EXTRACTION W/PHACO Left 02/17/2018   Procedure: CATARACT EXTRACTION PHACO AND INTRAOCULAR LENS PLACEMENT ([place]) LEFT;  Surgeon: Leandrew Koyanagi, MD;  Location: [facility];  Service: Ophthalmology;  Laterality: Left;  . CATARACT EXTRACTION W/PHACO  Right 03/10/2018   Procedure: CATARACT EXTRACTION PHACO AND INTRAOCULAR LENS PLACEMENT ([place]) RIGHT  COMPLICATED;  Surgeon: Leandrew Koyanagi, MD;  Location: [facility];  Service: Ophthalmology;  Laterality: Right;  NEEDS EARLY AM 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 [place]  . CHOLECYSTECTOMY N/A 06/26/2018   Procedure: LAPAROSCOPIC CHOLECYSTECTOMY;  Surgeon: Kinsinger, Arta Bruce, MD;  Location: [facility];  Service: General;  Laterality: N/A;  . COLONOSCOPY    . MULTIPLE TOOTH EXTRACTIONS    . TONSILLECTOMY      Medications Prior to Admission medications   Medication Sig Start Date End Date Taking? Authorizing Provider  acetaminophen (TYLENOL) 500 MG tablet Take 500 mg by mouth every 6 (six) hours as needed.    [provider]  alendronate (FOSAMAX) 70 MG tablet Take 70 mg by mouth every Saturday.  01/03/17   [provider]  allopurinol (ZYLOPRIM) 100 MG tablet Take 100 mg by mouth at bedtime.  01/05/17   [provider]  amoxicillin-clavulanate (AUGMENTIN) 875-125 MG tablet Take 1 tablet by mouth every 12 (twelve) hours. 06/28/18   Nicholes Mango, MD  aspirin 81 MG tablet Take 81 mg by mouth at bedtime.     [provider]  atorvastatin (LIPITOR) 40 MG tablet Take 40 mg by mouth at bedtime.  12/15/16   [provider]  baclofen (LIORESAL) 10 MG tablet Take 10 mg by mouth at bedtime.  06/13/17   [provider]  Calcium Carb-Cholecalciferol (CALCIUM 600+D3 PO) Take  1 tablet by mouth 2 (two) times daily.    [provider]  hydrALAZINE (APRESOLINE) 100 MG tablet Take 50 mg by mouth 3 (three) times daily.  12/03/16   [provider]  ibuprofen (ADVIL,MOTRIN) 800 MG tablet Take 1 tablet (800 mg total) by mouth every 8 (eight) hours as needed. 06/26/18   Kinsinger, Arta Bruce, MD  labetalol (NORMODYNE) 200 MG tablet Take 200 mg by mouth 2 (two) times daily. 03/13/18   [provider]  losartan-hydrochlorothiazide (HYZAAR)  100-12.5 MG tablet Take 1 tablet by mouth daily. 02/02/17   [provider]  meclizine (ANTIVERT) 12.5 MG tablet Take 1 tablet (12.5 mg total) by mouth 3 (three) times daily as needed for dizziness. 03/29/19   Gregor Hams, MD  Multiple Vitamin (MULTIVITAMIN WITH MINERALS) TABS tablet Take 1 tablet by mouth daily.    [provider]  pantoprazole (PROTONIX) 40 MG tablet Take 40 mg by mouth daily.    [provider]  potassium chloride (K-DUR) 10 MEQ tablet Take 10 mEq by mouth every Monday, Wednesday, and Friday. In the morning. 11/03/16   [provider]  senna-docusate (SENOKOT-S) 8.6-50 MG tablet Take 1 tablet by mouth at bedtime as needed for mild constipation. 06/28/18   Gouru, Illene Silver, MD  traMADol (ULTRAM) 50 MG tablet Take 1 tablet (50 mg total) by mouth every 6 (six) hours as needed for moderate pain. 06/28/18   Nicholes Mango, MD    Allergies Fire ant, Gabapentin, Albuterol, and Codeine  Family Hx Family History  Problem Relation Age of Onset  . Heart disease Mother   . Heart failure Father   . Breast cancer Neg Hx     Social Hx Social History   Tobacco Use  . Smoking status: Passive Smoke Exposure - Never Smoker  . Smokeless tobacco: Never Used  Substance Use Topics  . Alcohol use: No  . Drug use: No     Review of Systems  Constitutional: Negative for fever. Negative for chills. Eyes: Negative for visual changes. ENT: Negative for sore throat. Cardiovascular: Negative for chest pain. Respiratory: Negative for shortness of breath. Gastrointestinal: Negative for abdominal pain. Negative for nausea. Negative for vomiting. Genitourinary: Negative for dysuria. Musculoskeletal: Negative for leg swelling. Skin: + Allergic reaction Neurological: Negative for for headaches.   Physical Exam  Vital Signs: ED Triage Vitals  Enc Vitals Group     BP 07/21/19 1328 (!) 152/64     Pulse Rate 07/21/19 1328 (!) 52     Resp 07/21/19 1328 13      Temp 07/21/19 1328 (!) 97.2 F (36.2 C)     Temp Source 07/21/19 1328 Axillary     SpO2 07/21/19 1328 100 %     Weight 07/21/19 1330 167 lb (75.8 kg)     Height 07/21/19 1330 5\' 5"  (1.651 m)     Head Circumference --      Peak Flow --      Pain Score 07/21/19 1329 5     Pain Loc --      Pain Edu? --      Excl. in [place]? --     Constitutional: Alert and oriented.  Eyes: Conjunctivae clear. Sclera anicteric. Head: Normocephalic. Atraumatic. Nose: No congestion. No rhinorrhea. Mouth/Throat: Mucous membranes are moist.  No lip or tongue swelling. Neck: No stridor.   Cardiovascular: Normal rate, regular rhythm. No murmurs. Extremities well perfused. Respiratory: Normal respiratory effort.  No wheezing.  Lungs CTAB.  Arrives on nonrebreather,  decreased to nasal cannula. Gastrointestinal: Soft and non-tender. No distention.  Musculoskeletal: No lower extremity edema. Neurologic:  Normal speech and language. No gross focal neurologic deficits are appreciated.  Skin: Scattered excoriations to the upper and lower extremities as well as abdomen. Psychiatric: Mood and affect are appropriate for situation.  EKG  N/A    Radiology  N/A   Procedures  Procedure(s) performed (including critical care):  Procedures   Initial Impression / Assessment and Plan / ED Course  73 y.o. female who presents to the ED after an anaphylactic reaction to fire ants, as above.  Required bagging initially with EMS, IM epi x 2, as well as Solu-Medrol, famotidine, Benadryl.  On arrival, she is normotensive, satting 100% on nonrebreather, decreased to nasal cannula.  Awake and alert. No wheezing.  No lip or tongue swelling.  Scattered excoriations from presumed fire ant bites.  Plan: patient has already is received IM epi x2, as well as steroids, Benadryl, famotidine.  We will CTM, obtain basic labs, and plan for observation admission for continued monitoring given the severity of her  reaction.   Final Clinical Impression(s) / ED Diagnosis  Final diagnoses:  Anaphylaxis, initial encounter      Note:  This document was prepared using Dragon voice recognition software and may include unintentional dictation errors.   Lilia Pro., MD 07/21/19 [phone]

## 2019-07-22 LAB — BASIC METABOLIC PANEL
Anion gap: 11 (ref 5–15)
BUN: 36 mg/dL — ABNORMAL HIGH (ref 8–23)
CO2: 19 mmol/L — ABNORMAL LOW (ref 22–32)
Calcium: 7.9 mg/dL — ABNORMAL LOW (ref 8.9–10.3)
Chloride: 110 mmol/L (ref 98–111)
Creatinine, Ser: 1.63 mg/dL — ABNORMAL HIGH (ref 0.44–1.00)
GFR calc Af Amer: 36 mL/min — ABNORMAL LOW (ref >60)
GFR calc non Af Amer: 31 mL/min — ABNORMAL LOW (ref >60)
Glucose, Bld: 181 mg/dL — ABNORMAL HIGH (ref 70–99)
Potassium: 4 mmol/L (ref 3.5–5.1)
Sodium: 140 mmol/L (ref 135–145)

## 2019-07-22 LAB — CBC WITH DIFFERENTIAL/PLATELET
Abs Immature Granulocytes: 0.04 10*3/uL (ref 0.00–0.07)
Basophils Absolute: 0 10*3/uL (ref 0.0–0.1)
Basophils Relative: 0 %
Eosinophils Absolute: 0 10*3/uL (ref 0.0–0.5)
Eosinophils Relative: 0 %
HCT: 32.3 % — ABNORMAL LOW (ref 36.0–46.0)
Hemoglobin: 10.4 g/dL — ABNORMAL LOW (ref 12.0–15.0)
Immature Granulocytes: 0 %
Lymphocytes Relative: 11 %
Lymphs Abs: 1.1 10*3/uL (ref 0.7–4.0)
MCH: 30 pg (ref 26.0–34.0)
MCHC: 32.2 g/dL (ref 30.0–36.0)
MCV: 93.1 fL (ref 80.0–100.0)
Monocytes Absolute: 0.4 10*3/uL (ref 0.1–1.0)
Monocytes Relative: 4 %
Neutro Abs: 8.9 10*3/uL — ABNORMAL HIGH (ref 1.7–7.7)
Neutrophils Relative %: 85 %
Platelets: 172 10*3/uL (ref 150–400)
RBC: 3.47 MIL/uL — ABNORMAL LOW (ref 3.87–5.11)
RDW: 14.5 % (ref 11.5–15.5)
WBC: 10.5 10*3/uL (ref 4.0–10.5)
nRBC: 0 % (ref 0.0–0.2)

## 2019-07-22 MED ORDER — EPINEPHRINE 0.3 MG/0.3ML IJ SOAJ: 0.3000 mg | INTRAMUSCULAR | 0 refills | Status: AC | PRN

## 2019-07-22 MED ORDER — SENNA 8.6 MG PO TABS: 1.0000 | ORAL_TABLET | Freq: Two times a day (BID) | ORAL | Status: DC | PRN

## 2019-07-22 NOTE — Progress Notes (Signed)
Patient alert and oriented, vss, no complaints of pain.  D/c home with son.  D/c telemetry and piv.  No questions.  Will be escorted out of hospital via wheelchair by volunteers.

## 2019-07-22 NOTE — Discharge Summary (Signed)
[place], 73 y.o., DOB [birth year], MRN [id number]. Admission date: 07/21/2019 Discharge Date 07/22/2019 Primary MD Perrin Maltese, MD Admitting Physician Fritzi Mandes, MD  Admission Diagnosis  Anaphylaxis, initial encounter [T78.2XXA]  Discharge Diagnosis   Active Problems:   Anaphylaxis likely due to fire ant bite Transient unresponsiveness with pulse loss likely due to anaphylaxis Acute kidney injury due to dehydration now improved Essential hypertension Hyperlipidemia Leukocytosis reactive due to stress reaction    Elaine Norris  is a 73 y.o. female with a known history of hypertension, hyperlipidemia, depression, gout comes the emergency room with possible anaphylactic reaction after she was bitten by fire ants while moving the lawn this morning. Patient was mowing the lawn this morning. She ran over hello fire ants. She felt the sting around her ankle.  She could not withstand the pain went in the house EMS was called by family and on fire department arrival patient was unresponsive but help pulse. She required some bagging. She was brought into the emergency room with not rebreather on 88% sats. She received IM epinephrine times two with improvement in her mental status and respiratory status. Patient also received Solu-Medrol Benadryl and famotidine in the field.  Emergency room patient's oxygen saturation was noted to be low and blood pressure was stable she was admitted overnight for observation.  This morning patient is mentally back to baseline is not having any symptoms.  I have written her prescription for epinephrine in case she has another episode like this.          Consults  None  Significant Tests:  See full reports for all details     US Breast Ltd Uni Right Inc Axilla  Result Date: 06/29/2019 CLINICAL DATA:  Patient recalled from screening for possible right breast mass. EXAM:  DIGITAL DIAGNOSTIC RIGHT MAMMOGRAM WITH CAD AND TOMO ULTRASOUND RIGHT BREAST COMPARISON:  Previous exam(s). ACR Breast Density Category c: The breast tissue is heterogeneously dense, which may obscure small masses. FINDINGS: Persistent oval low-density mass within the upper inner right breast middle depth, further evaluated with spot compression views. Mammographic images were processed with CAD. Targeted ultrasound is performed, showing a 6 x 4 x 16 mm oval circumscribed hypoechoic mass right breast 1 o'clock position 5 cm from nipple. IMPRESSION: Probably benign right breast mass. RECOMMENDATION: Right breast diagnostic mammography and ultrasound in 6 months to ensure stability of probably benign right breast mass. I have discussed the findings and recommendations with the patient. Results were also provided in writing at the conclusion of the visit. If applicable, a reminder letter will be sent to the patient regarding the next appointment. BI-RADS CATEGORY  3: Probably benign. Electronically Signed   By: Lovey Newcomer M.D.   On: 06/29/2019 11:59   Mm Diag Breast Tomo Uni Right  Result Date: 06/29/2019 CLINICAL DATA:  Patient recalled from screening for possible right breast mass. EXAM: DIGITAL DIAGNOSTIC RIGHT MAMMOGRAM WITH CAD AND TOMO ULTRASOUND RIGHT BREAST COMPARISON:  Previous exam(s). ACR Breast Density Category c: The breast tissue is heterogeneously dense, which may obscure small masses. FINDINGS: Persistent oval low-density mass within the upper inner right breast middle depth, further evaluated with spot compression views. Mammographic images were processed with CAD. Targeted ultrasound is performed, showing a 6 x 4 x 16 mm oval circumscribed hypoechoic mass right breast 1 o'clock position 5 cm from nipple. IMPRESSION: Probably benign right breast mass. RECOMMENDATION: Right breast diagnostic mammography  and ultrasound in 6 months to ensure stability of probably benign right breast mass. I have  discussed the findings and recommendations with the patient. Results were also provided in writing at the conclusion of the visit. If applicable, a reminder letter will be sent to the patient regarding the next appointment. BI-RADS CATEGORY  3: Probably benign. Electronically Signed   By: Lovey Newcomer M.D.   On: 06/29/2019 11:59       Today   Subjective:   Remus Loffler patient doing well denies any complaints  Objective:   Blood pressure (!) 149/64, pulse 76, temperature 97.8 F (36.6 C), temperature source Oral, resp. rate 19, height 5\' 5"  (1.651 m), weight 85.9 kg, SpO2 98 %.  .  Intake/Output Summary (Last 24 hours) at 07/22/2019 1327 Last data filed at 07/22/2019 1008 Gross per 24 hour  Intake 240 ml  Output 0 ml  Net 240 ml    Exam VITAL SIGNS: Blood pressure (!) 149/64, pulse 76, temperature 97.8 F (36.6 C), temperature source Oral, resp. rate 19, height 5\' 5"  (1.651 m), weight 85.9 kg, SpO2 98 %.  GENERAL:  73 y.o.-year-old patient lying in the bed with no acute distress.  EYES: Pupils equal, round, reactive to light and accommodation. No scleral icterus. Extraocular muscles intact.  HEENT: Head atraumatic, normocephalic. Oropharynx and nasopharynx clear.  NECK:  Supple, no jugular venous distention. No thyroid enlargement, no tenderness.  LUNGS: Normal breath sounds bilaterally, no wheezing, rales,rhonchi or crepitation. No use of accessory muscles of respiration.  CARDIOVASCULAR: S1, S2 normal. No murmurs, rubs, or gallops.  ABDOMEN: Soft, nontender, nondistended. Bowel sounds present. No organomegaly or mass.  EXTREMITIES: No pedal edema, cyanosis, or clubbing.  NEUROLOGIC: Cranial nerves II through XII are intact. Muscle strength 5/5 in all extremities. Sensation intact. Gait not checked.  PSYCHIATRIC: The patient is alert and oriented x 3.  SKIN: No obvious rash, lesion, or ulcer.   Data Review     CBC w Diff:  Lab Results  Component Value Date   WBC 10.5  07/22/2019   HGB 10.4 (L) 07/22/2019   HGB 12.1 08/08/2014   HCT 32.3 (L) 07/22/2019   HCT 37.2 08/08/2014   PLT 172 07/22/2019   PLT 154 08/08/2014   LYMPHOPCT 11 07/22/2019   MONOPCT 4 07/22/2019   EOSPCT 0 07/22/2019   BASOPCT 0 07/22/2019   CMP:  Lab Results  Component Value Date   NA 140 07/22/2019   NA 139 08/08/2014   K 4.0 07/22/2019   K 3.3 (L) 08/08/2014   CL 110 07/22/2019   CL 102 08/08/2014   CO2 19 (L) 07/22/2019   CO2 30 08/08/2014   BUN 36 (H) 07/22/2019   BUN 18 08/08/2014   CREATININE 1.63 (H) 07/22/2019   CREATININE 1.26 08/08/2014   PROT 6.7 03/28/2019   PROT 7.9 11/06/2012   ALBUMIN 3.8 03/28/2019   ALBUMIN 4.2 11/06/2012   BILITOT 0.7 03/28/2019   BILITOT 0.3 11/06/2012   ALKPHOS 77 03/28/2019   ALKPHOS 131 11/06/2012   AST 22 03/28/2019   AST 26 11/06/2012   ALT 23 03/28/2019   ALT 28 11/06/2012  .  Micro Results Recent Results (from the past 240 hour(s))  SARS Coronavirus 2 [facility] order, Performed in [facility] hospital lab) Nasopharyngeal Nasopharyngeal Swab     Status: None   Collection Time: 07/21/19  1:48 PM   Specimen: Nasopharyngeal Swab  Result Value Ref Range Status   SARS Coronavirus 2 NEGATIVE NEGATIVE Final  Comment: (NOTE) If result is NEGATIVE SARS-CoV-2 target nucleic acids are NOT DETECTED. The SARS-CoV-2 RNA is generally detectable in upper and lower  respiratory specimens during the acute phase of infection. The lowest  concentration of SARS-CoV-2 viral copies this assay can detect is 250  copies / mL. A negative result does not preclude SARS-CoV-2 infection  and should not be used as the sole basis for treatment or other  patient management decisions.  A negative result may occur with  improper specimen collection / handling, submission of specimen other  than nasopharyngeal swab, presence of viral mutation(s) within the  areas targeted by this assay, and inadequate number of viral copies  (<250 copies / mL).  A negative result must be combined with clinical  observations, patient history, and epidemiological information. If result is POSITIVE SARS-CoV-2 target nucleic acids are DETECTED. The SARS-CoV-2 RNA is generally detectable in upper and lower  respiratory specimens dur ing the acute phase of infection.  Positive  results are indicative of active infection with SARS-CoV-2.  Clinical  correlation with patient history and other diagnostic information is  necessary to determine patient infection status.  Positive results do  not rule out bacterial infection or co-infection with other viruses. If result is PRESUMPTIVE POSTIVE SARS-CoV-2 nucleic acids MAY BE PRESENT.   A presumptive positive result was obtained on the submitted specimen  and confirmed on repeat testing.  While 2019 novel coronavirus  (SARS-CoV-2) nucleic acids may be present in the submitted sample  additional confirmatory testing may be necessary for epidemiological  and / or clinical management purposes  to differentiate between  SARS-CoV-2 and other Sarbecovirus currently known to infect humans.  If clinically indicated additional testing with an alternate test  methodology [id number]) is advised. The SARS-CoV-2 RNA is generally  detectable in upper and lower respiratory sp ecimens during the acute  phase of infection. The expected result is Negative. Fact Sheet for Patients:  StrictlyIdeas.no Fact Sheet for Healthcare Providers: BankingDealers.co.za This test is not yet approved or cleared by the [place] FDA and has been authorized for detection and/or diagnosis of SARS-CoV-2 by FDA under an Emergency Use Authorization (EUA).  This EUA will remain in effect (meaning this test can be used) for the duration of the COVID-19 declaration under Section 564(b)(1) of the Act, 21 U.S.C. section 360bbb-3(b)(1), unless the authorization is terminated or revoked  sooner. Performed at [facility], [address] [place], [place] [address]         Code Status Orders  (From admission, onward)         Start     Ordered   07/21/19 1810  Full code  Continuous     07/21/19 1809        Code Status History    Date Active Date Inactive Code Status Order ID Comments User Context   06/26/2018 1619 06/28/2018 2034 Full Code [id number]  Saundra Shelling, MD Inpatient   02/25/2017 1747 02/26/2017 1809 Full Code [id number]  Bettey Costa, MD Inpatient   Advance Care Planning Activity    Advance Directive Documentation     Most Recent Value  Type of Advance Directive  Living will, Healthcare Power of Attorney  Pre-existing out of facility DNR order (yellow form or pink MOST form)  -  "MOST" Form in Place?  -            Discharge Medications   Allergies as of 07/22/2019      Reactions   Fire Ant Anaphylaxis  Gabapentin    Muscles jerks, weakness and difficulty swallowing - Noted in hospital stay   Albuterol Other (See Comments)   Pt reports seizures   Codeine Other (See Comments)   Altered mental status      Medication List    TAKE these medications   acetaminophen 500 MG tablet Commonly known as: TYLENOL Take 500-1,000 mg by mouth every 6 (six) hours as needed for mild pain or fever.   alendronate 70 MG tablet Commonly known as: FOSAMAX Take 70 mg by mouth every Saturday.   allopurinol 100 MG tablet Commonly known as: ZYLOPRIM Take 100 mg by mouth at bedtime.   aspirin 81 MG tablet Take 81 mg by mouth at bedtime.   atorvastatin 40 MG tablet Commonly known as: LIPITOR Take 40 mg by mouth at bedtime.   baclofen 10 MG tablet Commonly known as: LIORESAL Take 10 mg by mouth 2 (two) times daily.   EPINEPHrine 0.3 mg/0.3 mL Soaj injection Commonly known as: EpiPen 2-Pak Inject 0.3 mLs (0.3 mg total) into the muscle as needed for up to 1 day for anaphylaxis.   labetalol 200 MG tablet Commonly known as:  NORMODYNE Take 200 mg by mouth 2 (two) times daily.   meclizine 12.5 MG tablet Commonly known as: ANTIVERT Take 1 tablet (12.5 mg total) by mouth 3 (three) times daily as needed for dizziness.   multivitamin with minerals Tabs tablet Take 1 tablet by mouth daily.   pantoprazole 40 MG tablet Commonly known as: PROTONIX Take 40 mg by mouth daily.          Total Time in preparing paper work, data evaluation and todays exam - 74 minutes  Dustin Flock M.D on 07/22/2019 at [place]  [phone]

## 2019-10-19 ENCOUNTER — Other Ambulatory Visit: Payer: Self-pay

## 2019-10-19 DIAGNOSIS — Z20822 Contact with and (suspected) exposure to covid-19: Secondary | ICD-10-CM

## 2019-10-20 LAB — NOVEL CORONAVIRUS, NAA: SARS-CoV-2, NAA: NOT DETECTED

## 2019-12-31 ENCOUNTER — Other Ambulatory Visit: Payer: Medicare Other

## 2020-01-08 ENCOUNTER — Other Ambulatory Visit: Payer: Medicare Other

## 2020-01-14 ENCOUNTER — Ambulatory Visit
Admission: RE | Admit: 2020-01-14 | Discharge: 2020-01-14 | Disposition: A | Discharge status: Home / Self Care | Payer: Medicare Other | Source: Ambulatory Visit | Attending: Family | Admitting: Family

## 2020-01-14 DIAGNOSIS — N6310 Unspecified lump in the right breast, unspecified quadrant: Secondary | ICD-10-CM

## 2020-01-14 DIAGNOSIS — R928 Other abnormal and inconclusive findings on diagnostic imaging of breast: Secondary | ICD-10-CM | POA: Insufficient documentation

## 2020-01-18 ENCOUNTER — Other Ambulatory Visit
Admission: RE | Admit: 2020-01-18 | Discharge: 2020-01-18 | Disposition: A | Discharge status: Home / Self Care | Payer: Medicare Other | Source: Ambulatory Visit | Attending: Internal Medicine | Admitting: Internal Medicine

## 2020-01-18 ENCOUNTER — Other Ambulatory Visit: Payer: Self-pay | Admitting: Family

## 2020-01-18 DIAGNOSIS — Z20822 Contact with and (suspected) exposure to covid-19: Secondary | ICD-10-CM | POA: Insufficient documentation

## 2020-01-18 DIAGNOSIS — Z01812 Encounter for preprocedural laboratory examination: Secondary | ICD-10-CM | POA: Insufficient documentation

## 2020-01-18 DIAGNOSIS — N6310 Unspecified lump in the right breast, unspecified quadrant: Secondary | ICD-10-CM

## 2020-01-18 LAB — SARS CORONAVIRUS 2 (TAT 6-24 HRS): SARS Coronavirus 2: NEGATIVE

## 2020-01-19 ENCOUNTER — Other Ambulatory Visit: Payer: Medicare Other

## 2020-01-20 ENCOUNTER — Encounter: Payer: Self-pay | Admitting: Internal Medicine

## 2020-01-21 ENCOUNTER — Ambulatory Visit
Admission: RE | Admit: 2020-01-21 | Discharge: 2020-01-21 | Disposition: A | Discharge status: Home / Self Care | Payer: Medicare Other | Attending: Internal Medicine | Admitting: Internal Medicine

## 2020-01-21 ENCOUNTER — Ambulatory Visit: Payer: Medicare Other | Admitting: Certified Registered Nurse Anesthetist

## 2020-01-21 ENCOUNTER — Other Ambulatory Visit: Payer: Self-pay

## 2020-01-21 ENCOUNTER — Encounter
Admission: RE | Disposition: A | Discharge status: Home / Self Care | Payer: Self-pay | Source: Home / Self Care | Attending: Internal Medicine

## 2020-01-21 ENCOUNTER — Encounter: Payer: Self-pay | Admitting: Internal Medicine

## 2020-01-21 DIAGNOSIS — K2100 Gastro-esophageal reflux disease with esophagitis, without bleeding: Secondary | ICD-10-CM | POA: Insufficient documentation

## 2020-01-21 DIAGNOSIS — G4730 Sleep apnea, unspecified: Secondary | ICD-10-CM | POA: Diagnosis not present

## 2020-01-21 DIAGNOSIS — E785 Hyperlipidemia, unspecified: Secondary | ICD-10-CM | POA: Insufficient documentation

## 2020-01-21 DIAGNOSIS — F329 Major depressive disorder, single episode, unspecified: Secondary | ICD-10-CM | POA: Insufficient documentation

## 2020-01-21 DIAGNOSIS — Z888 Allergy status to other drugs, medicaments and biological substances status: Secondary | ICD-10-CM | POA: Diagnosis not present

## 2020-01-21 DIAGNOSIS — K5730 Diverticulosis of large intestine without perforation or abscess without bleeding: Secondary | ICD-10-CM | POA: Insufficient documentation

## 2020-01-21 DIAGNOSIS — K449 Diaphragmatic hernia without obstruction or gangrene: Secondary | ICD-10-CM | POA: Insufficient documentation

## 2020-01-21 DIAGNOSIS — Z8541 Personal history of malignant neoplasm of cervix uteri: Secondary | ICD-10-CM | POA: Diagnosis not present

## 2020-01-21 DIAGNOSIS — I10 Essential (primary) hypertension: Secondary | ICD-10-CM | POA: Diagnosis not present

## 2020-01-21 DIAGNOSIS — Z885 Allergy status to narcotic agent status: Secondary | ICD-10-CM | POA: Diagnosis not present

## 2020-01-21 DIAGNOSIS — K640 First degree hemorrhoids: Secondary | ICD-10-CM | POA: Insufficient documentation

## 2020-01-21 DIAGNOSIS — K591 Functional diarrhea: Secondary | ICD-10-CM | POA: Diagnosis present

## 2020-01-21 DIAGNOSIS — Z7982 Long term (current) use of aspirin: Secondary | ICD-10-CM | POA: Insufficient documentation

## 2020-01-21 DIAGNOSIS — D500 Iron deficiency anemia secondary to blood loss (chronic): Secondary | ICD-10-CM | POA: Insufficient documentation

## 2020-01-21 DIAGNOSIS — M1990 Unspecified osteoarthritis, unspecified site: Secondary | ICD-10-CM | POA: Insufficient documentation

## 2020-01-21 DIAGNOSIS — M109 Gout, unspecified: Secondary | ICD-10-CM | POA: Diagnosis not present

## 2020-01-21 DIAGNOSIS — Z79899 Other long term (current) drug therapy: Secondary | ICD-10-CM | POA: Diagnosis not present

## 2020-01-21 HISTORY — DX: Esophagitis, unspecified without bleeding: K20.90

## 2020-01-21 HISTORY — DX: Acute myocardial infarction, unspecified: I21.9

## 2020-01-21 HISTORY — DX: Conversion disorder with seizures or convulsions: F44.5

## 2020-01-21 HISTORY — PX: ESOPHAGOGASTRODUODENOSCOPY (EGD) WITH PROPOFOL: SHX5813

## 2020-01-21 HISTORY — PX: COLONOSCOPY WITH PROPOFOL: SHX5780

## 2020-01-21 SURGERY — COLONOSCOPY WITH PROPOFOL
Anesthesia: General

## 2020-01-21 MED ORDER — LIDOCAINE HCL (CARDIAC) PF 100 MG/5ML IV SOSY
PREFILLED_SYRINGE | INTRAVENOUS | Status: DC | PRN
  Administered 2020-01-21: 50 mg via INTRAVENOUS

## 2020-01-21 MED ORDER — PROPOFOL 500 MG/50ML IV EMUL
INTRAVENOUS | Status: AC
  Filled 2020-01-21: qty 50

## 2020-01-21 MED ORDER — HYDRALAZINE HCL 20 MG/ML IJ SOLN
INTRAMUSCULAR | Status: DC | PRN
  Administered 2020-01-21: 5 mg via INTRAVENOUS

## 2020-01-21 MED ORDER — LIDOCAINE HCL (PF) 2 % IJ SOLN
INTRAMUSCULAR | Status: AC
  Filled 2020-01-21: qty 5

## 2020-01-21 MED ORDER — EPHEDRINE 5 MG/ML INJ
INTRAVENOUS | Status: AC
  Filled 2020-01-21: qty 4

## 2020-01-21 MED ORDER — LABETALOL HCL 5 MG/ML IV SOLN
INTRAVENOUS | Status: DC | PRN
  Administered 2020-01-21 (×2): 10 mg via INTRAVENOUS

## 2020-01-21 MED ORDER — PROPOFOL 500 MG/50ML IV EMUL
INTRAVENOUS | Status: DC | PRN
  Administered 2020-01-21: 100 ug/kg/min via INTRAVENOUS

## 2020-01-21 MED ORDER — SUCCINYLCHOLINE CHLORIDE 200 MG/10ML IV SOSY
PREFILLED_SYRINGE | INTRAVENOUS | Status: AC
  Filled 2020-01-21: qty 10

## 2020-01-21 MED ORDER — SODIUM CHLORIDE 0.9 % IV SOLN: INTRAVENOUS | Status: DC

## 2020-01-21 MED ORDER — EPHEDRINE SULFATE 50 MG/ML IJ SOLN
INTRAMUSCULAR | Status: DC | PRN
  Administered 2020-01-21: 5 mg via INTRAVENOUS

## 2020-01-21 MED ORDER — PROPOFOL 10 MG/ML IV BOLUS
INTRAVENOUS | Status: DC | PRN
  Administered 2020-01-21: 20 mg via INTRAVENOUS
  Administered 2020-01-21 (×3): 10 mg via INTRAVENOUS
  Administered 2020-01-21: 40 mg via INTRAVENOUS
  Administered 2020-01-21: 20 mg via INTRAVENOUS

## 2020-01-21 NOTE — Interval H&P Note (Deleted)
History and Physical Interval Note:  01/21/2020 8:25 AM  Elaine Norris  has presented today for surgery, with the diagnosis of IDA HX POLYPS.  The various methods of treatment have been discussed with the patient and family. After consideration of risks, benefits and other options for treatment, the patient has consented to  Procedure(s): COLONOSCOPY WITH PROPOFOL (N/A) ESOPHAGOGASTRODUODENOSCOPY (EGD) WITH PROPOFOL (N/A) as a surgical intervention.  The patient's history has been reviewed, patient examined, no change in status, stable for surgery.  I have reviewed the patient's chart and labs.  Questions were answered to the patient's satisfaction.     [place], [place]

## 2020-01-21 NOTE — Interval H&P Note (Signed)
History and Physical Interval Note:  01/21/2020 8:36 AM  Elaine Norris  has presented today for surgery, with the diagnosis of IDA HX POLYPS.  The various methods of treatment have been discussed with the patient and family. After consideration of risks, benefits and other options for treatment, the patient has consented to  Procedure(s): COLONOSCOPY WITH PROPOFOL (N/A) ESOPHAGOGASTRODUODENOSCOPY (EGD) WITH PROPOFOL (N/A) as a surgical intervention.  The patient's history has been reviewed, patient examined, no change in status, stable for surgery.  I have reviewed the patient's chart and labs.  Questions were answered to the patient's satisfaction.     [place], [place]

## 2020-01-21 NOTE — Op Note (Signed)
[facility] Patient Name: Elaine Norris Procedure Date: 01/21/2020 8:32 AM MRN: [id number] Account #: [id number] Date of Birth: 16-Aug-1946 Admit Type: Outpatient Age: 74 Room: [facility] ENDO ROOM 1 Gender: Female Note Status: Finalized Procedure:             Colonoscopy Indications:           Functional diarrhea, Iron deficiency anemia secondary                         to chronic blood loss Providers:             Benay Pike. [name] MD, MD Referring MD:          Perrin Maltese, MD (Referring MD) Medicines:             Propofol per Anesthesia Complications:         No immediate complications. Procedure:             Pre-Anesthesia Assessment:                        - The risks and benefits of the procedure and the                         sedation options and risks were discussed with the                         patient. All questions were answered and informed                         consent was obtained.                        - Patient identification and proposed procedure were                         verified prior to the procedure by the nurse. The                         procedure was verified in the procedure room.                        - ASA Grade Assessment: III - A patient with severe                         systemic disease.                        - After reviewing the risks and benefits, the patient                         was deemed in satisfactory condition to undergo the                         procedure.                        After obtaining informed consent, the colonoscope was                         passed under direct vision. Throughout the  procedure,                         the patient's blood pressure, pulse, and oxygen                         saturations were monitored continuously. The                         Colonoscope was introduced through the anus and                         advanced to the the cecum, identified by  appendiceal                         orifice and ileocecal valve. The colonoscopy was                         performed without difficulty. The patient tolerated                         the procedure well. The quality of the bowel                         preparation was adequate. The ileocecal valve,                         appendiceal orifice, and rectum were photographed. Findings:      The perianal and digital rectal examinations were normal. Pertinent       negatives include normal sphincter tone and no palpable rectal lesions.      Many medium-mouthed diverticula were found in the sigmoid colon.      Normal mucosa was found in the entire colon. Biopsies for histology were       taken with a cold forceps from the random colon for evaluation of       microscopic colitis.      Non-bleeding internal hemorrhoids were found during retroflexion. The       hemorrhoids were Grade I (internal hemorrhoids that do not prolapse).      The exam was otherwise without abnormality. Impression:            - Diverticulosis in the sigmoid colon.                        - Normal mucosa in the entire examined colon. Biopsied.                        - Non-bleeding internal hemorrhoids.                        - The examination was otherwise normal. Recommendation:        - Patient has a contact number available for                         emergencies. The signs and symptoms of potential                         delayed complications were discussed with the patient.  Return to normal activities tomorrow. Written                         discharge instructions were provided to the patient.                        - Resume previous diet.                        - Continue present medications.                        - Await pathology results.                        - To visualize the small bowel, perform video capsule                         endoscopy at appointment to be scheduled.                         - The findings and recommendations were discussed with                         the patient.                        - Return to physician assistant in 6 weeks.                        - Please follow up with Tammi Klippel, PA-C in [ ]                          months. Procedure Code(s):     --- Professional ---                        [phone], Colonoscopy, flexible; with biopsy, single or                         multiple Diagnosis Code(s):     --- Professional ---                        K57.30, Diverticulosis of large intestine without                         perforation or abscess without bleeding                        D50.0, Iron deficiency anemia secondary to blood loss                         (chronic)                        K59.1, Functional diarrhea                        K64.0, First degree hemorrhoids CPT copyright 2019 American Medical Association. All rights reserved. The codes documented in this report are preliminary and upon coder review may  be revised to meet current compliance requirements. Benay Pike   MD, MD 01/21/2020 9:25:13 AM This report has been signed electronically. Number of Addenda: 0 Note Initiated On: 01/21/2020 8:32 AM Scope Withdrawal Time: 0 hours 5 minutes 29 seconds  Total Procedure Duration: 0 hours 11 minutes 28 seconds  Estimated Blood Loss:  Estimated blood loss: none.      [facility]

## 2020-01-21 NOTE — H&P (Deleted)
Outpatient short stay form Pre-procedure 01/21/2020 8:25 AM Elaine Norris, M.D.  Primary Physician: Lamonte Sakai, M.D.  Reason for visit: Colon cancer screening  History of present illness: Patient presents for colonoscopy for colon cancer screening. The patient denies complaints of abdominal pain, significant change in bowel habits, or rectal bleeding.      Current Facility-Administered Medications:  .  0.9 %  sodium chloride infusion, , Intravenous, Continuous, [place], Benay Pike, MD, Last Rate: 20 mL/hr at 01/21/20 0746, New Bag at 01/21/20 0746  Medications Prior to Admission  Medication Sig Dispense Refill Last Dose  . alendronate (FOSAMAX) 70 MG tablet Take 70 mg by mouth every Saturday.    Past Week at Unknown time  . allopurinol (ZYLOPRIM) 100 MG tablet Take 100 mg by mouth at bedtime.    01/20/2020 at Unknown time  . aspirin 81 MG tablet Take 81 mg by mouth at bedtime.    01/20/2020 at Unknown time  . atorvastatin (LIPITOR) 40 MG tablet Take 40 mg by mouth at bedtime.    01/20/2020 at Unknown time  . calcium-vitamin D 250-100 MG-UNIT tablet Take 1 tablet by mouth 2 (two) times daily.   Past Week at Unknown time  . EPINEPHrine (EPIPEN 2-PAK) 0.3 mg/0.3 mL IJ SOAJ injection Inject 0.3 mg into the muscle as needed for anaphylaxis.     . hydrALAZINE (APRESOLINE) 100 MG tablet Take 100 mg by mouth 2 (two) times daily.   01/20/2020 at Unknown time  . labetalol (NORMODYNE) 200 MG tablet Take 200 mg by mouth 2 (two) times daily.  2 01/21/2020 at Unknown time  . losartan (COZAAR) 25 MG tablet Take 12.5 mg by mouth daily.   01/21/2020 at Unknown time  . Multiple Vitamin (MULTIVITAMIN WITH MINERALS) TABS tablet Take 1 tablet by mouth daily.   Past Week at Unknown time  . nitroGLYCERIN (NITROSTAT) 0.4 MG SL tablet Place 0.4 mg under the tongue every 5 (five) minutes as needed for chest pain.     . pantoprazole (PROTONIX) 40 MG tablet Take 40 mg by mouth daily.   01/20/2020 at Unknown time  .  acetaminophen (TYLENOL) 500 MG tablet Take 500-1,000 mg by mouth every 6 (six) hours as needed for mild pain or fever.      . baclofen (LIORESAL) 10 MG tablet Take 10 mg by mouth 2 (two) times daily.      . meclizine (ANTIVERT) 12.5 MG tablet Take 1 tablet (12.5 mg total) by mouth 3 (three) times daily as needed for dizziness. 30 tablet 0      Allergies  Allergen Reactions  . [employer] Anaphylaxis  . Gabapentin     Muscles jerks, weakness and difficulty swallowing - Noted in hospital stay  . Albuterol Other (See Comments)    Pt reports seizures   . Codeine Other (See Comments)    Altered mental status      Past Medical History:  Diagnosis Date  . Anxiety    Related to surgery  . Arthritis    hands  . Cancer ([place]) 1970's   cervical  . Depression    Controlled  . Esophagitis   . GERD (gastroesophageal reflux disease)   . Gout   . Hyperlipidemia   . Hypertension   . Myocardial infarction ([place])   . Osteoporosis    feet  . Pseudoseizure   . Sleep apnea    CPAP    Review of systems:  Otherwise negative.    Physical Exam  Gen: Alert,  oriented. Appears stated age.  HEENT: [place]/AT. PERRLA. Lungs: CTA, no wheezes. CV: RR nl S1, S2. Abd: soft, benign, no masses. BS+ Ext: No edema. Pulses 2+    Planned procedures: Proceed with colonoscopy. The patient understands the nature of the planned procedure, indications, risks, alternatives and potential complications including but not limited to bleeding, infection, perforation, damage to internal organs and possible oversedation/side effects from anesthesia. The patient agrees and gives consent to proceed.  Please refer to procedure notes for findings, recommendations and patient disposition/instructions.     [name], M.D. Gastroenterology 01/21/2020  8:25 AM

## 2020-01-21 NOTE — Op Note (Signed)
[facility] Patient Name: Elaine Norris Procedure Date: 01/21/2020 8:32 AM MRN: [id number] Account #: [id number] Date of Birth: 03-16-1946 Admit Type: Outpatient Age: 74 Room: [facility] ENDO ROOM 1 Gender: Female Note Status: Finalized Procedure:             Upper GI endoscopy Indications:           Iron deficiency anemia secondary to chronic blood loss Providers:             Benay Pike. [name] MD, MD Referring MD:          Perrin Maltese, MD (Referring MD) Medicines:             Propofol per Anesthesia Complications:         No immediate complications. Procedure:             Pre-Anesthesia Assessment:                        - The risks and benefits of the procedure and the                         sedation options and risks were discussed with the                         patient. All questions were answered and informed                         consent was obtained.                        - Patient identification and proposed procedure were                         verified prior to the procedure by the nurse. The                         procedure was verified in the procedure room.                        - ASA Grade Assessment: III - A patient with severe                         systemic disease.                        - After reviewing the risks and benefits, the patient                         was deemed in satisfactory condition to undergo the                         procedure.                        After obtaining informed consent, the endoscope was                         passed under direct vision. Throughout the procedure,  the patient's blood pressure, pulse, and oxygen                         saturations were monitored continuously. The Endoscope                         was introduced through the mouth, and advanced to the                         third part of duodenum. The upper GI endoscopy was      accomplished without difficulty. The patient tolerated                         the procedure well. Findings:      LA Grade A (one or more mucosal breaks less than 5 mm, not extending       between tops of 2 mucosal folds) esophagitis with no bleeding was found       in the distal esophagus.      A 1 cm hiatal hernia was present.      The examined duodenum was normal.      The exam was otherwise without abnormality. Impression:            - LA Grade A reflux esophagitis with no bleeding.                        - 1 cm hiatal hernia.                        - Normal examined duodenum.                        - The examination was otherwise normal.                        - No specimens collected. Recommendation:        - Proceed with colonoscopy Procedure Code(s):     --- Professional ---                        [id number], Esophagogastroduodenoscopy, flexible,                         transoral; diagnostic, including collection of                         specimen(s) by brushing or washing, when performed                         (separate procedure) Diagnosis Code(s):     --- Professional ---                        D50.0, Iron deficiency anemia secondary to blood loss                         (chronic)                        K44.9, Diaphragmatic hernia without obstruction or  gangrene                        K21.00 CPT copyright 2019 American Medical Association. All rights reserved. The codes documented in this report are preliminary and upon coder review may  be revised to meet current compliance requirements. Efrain Sella MD, MD 01/21/2020 9:06:16 AM This report has been signed electronically. Number of Addenda: 0 Note Initiated On: 01/21/2020 8:32 AM Estimated Blood Loss:  Estimated blood loss: none.      [facility]

## 2020-01-21 NOTE — Anesthesia Preprocedure Evaluation (Addendum)
Anesthesia Evaluation  Patient identified by MRN, date of birth, ID band Patient awake    Reviewed: Allergy & Precautions, H&P , NPO status , Patient's Chart, lab work & pertinent test results  Airway Mallampati: II  TM Distance: >3 FB Neck ROM: full    Dental  (+) Upper Dentures, Lower Dentures   Pulmonary sleep apnea and Continuous Positive Airway Pressure Ventilation ,           Cardiovascular hypertension, (-) angina+ Past MI       Neuro/Psych Seizures - (pseudoseizures),  PSYCHIATRIC DISORDERS Anxiety Depression    GI/Hepatic Neg liver ROS, GERD  ,  Endo/Other  negative endocrine ROS  Renal/GU negative Renal ROS  negative genitourinary   Musculoskeletal   Abdominal   Peds  Hematology negative hematology ROS (+)   Anesthesia Other Findings Past Medical History: No date: Anxiety     Comment:  Related to surgery No date: Arthritis     Comment:  hands 1970's: Cancer ([place])     Comment:  cervical No date: Depression     Comment:  Controlled No date: Esophagitis No date: GERD (gastroesophageal reflux disease) No date: Gout No date: Hyperlipidemia No date: Hypertension No date: Myocardial infarction ([place]) No date: Osteoporosis     Comment:  feet No date: Pseudoseizure No date: Sleep apnea     Comment:  CPAP  Past Surgical History: 05/2011: CARDIAC CATHETERIZATION     Comment:  [place] 09/2011: CARDIAC CATHETERIZATION     Comment:  [facility] 2012: CARDIAC CATHETERIZATION     Comment:  [facility] 02/17/2018: CATARACT EXTRACTION W/PHACO; Left     Comment:  Procedure: CATARACT EXTRACTION PHACO AND INTRAOCULAR               LENS PLACEMENT ([place]) LEFT;  Surgeon: Leandrew Koyanagi, MD;  Location: [place];  Service:               Ophthalmology;  Laterality: Left; 03/10/2018: CATARACT EXTRACTION W/PHACO; Right     Comment:  Procedure: CATARACT EXTRACTION PHACO AND INTRAOCULAR               LENS  PLACEMENT ([place]) RIGHT  COMPLICATED;  Surgeon:               Leandrew Koyanagi, MD;  Location: [place];              Service: Ophthalmology;  Laterality: Right;  NEEDS EARLY               AM 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 [place] 06/26/2018: CHOLECYSTECTOMY; N/A     Comment:  Procedure: LAPAROSCOPIC CHOLECYSTECTOMY;  Surgeon:               Kinsinger, Arta Bruce, MD;  Location: [facility];  Service:              General;  Laterality: N/A; No date: COLONOSCOPY No date: MULTIPLE TOOTH EXTRACTIONS No date: TONSILLECTOMY No date: WISDOM TOOTH EXTRACTION  BMI    Body Mass Index: 28.59 kg/m      Reproductive/Obstetrics negative OB ROS                            Anesthesia Physical Anesthesia Plan  ASA: III  Anesthesia Plan: General   Post-op Pain Management:    Induction:   PONV Risk Score and Plan: Propofol infusion and TIVA  Airway Management Planned:   Additional Equipment:   Intra-op Plan:   Post-operative Plan:   Informed Consent: I have reviewed the patients History and Physical, chart, labs and discussed the procedure including the risks, benefits and alternatives for the proposed anesthesia with the patient or authorized representative who has indicated his/her understanding and acceptance.     Dental Advisory Given  Plan Discussed with: Anesthesiologist  Anesthesia Plan Comments:         Anesthesia Quick Evaluation

## 2020-01-21 NOTE — Anesthesia Postprocedure Evaluation (Signed)
Anesthesia Post Note  Patient: TERRYANN VERBEEK  Procedure(s) Performed: COLONOSCOPY WITH PROPOFOL (N/A ) ESOPHAGOGASTRODUODENOSCOPY (EGD) WITH PROPOFOL (N/A )  Patient location during evaluation: PACU Anesthesia Type: General Level of consciousness: awake and alert Pain management: pain level controlled Vital Signs Assessment: post-procedure vital signs reviewed and stable Respiratory status: spontaneous breathing, nonlabored ventilation and respiratory function stable Cardiovascular status: blood pressure returned to baseline and stable Postop Assessment: no apparent nausea or vomiting Anesthetic complications: no     Last Vitals:  Vitals:   01/21/20 0937 01/21/20 0947  BP: (!) 160/71 (!) 148/64  Pulse: 65 67  Resp: 16 17  Temp:    SpO2: 100% 98%    Last Pain:  Vitals:   01/21/20 0947  TempSrc:   PainSc: 0-No pain                 Tera Mater

## 2020-01-21 NOTE — H&P (Signed)
Outpatient short stay form Pre-procedure 01/21/2020 8:35 AM Elaine Norris K. Elaine Norris, M.D.  Primary Physician: Lamonte Sakai, mMD.  Reason for visit:  Iron deficiency, colon cancer screen, diarrhea  History of present illness: AS above. Otherwise, Patient denies change in bowel habits, rectal bleeding, weight loss or abdominal pain.      Current Facility-Administered Medications:  .  0.9 %  sodium chloride infusion, , Intravenous, Continuous, [place], Benay Pike, MD, Last Rate: 20 mL/hr at 01/21/20 0746, New Bag at 01/21/20 0746  Medications Prior to Admission  Medication Sig Dispense Refill Last Dose  . alendronate (FOSAMAX) 70 MG tablet Take 70 mg by mouth every Saturday.    Past Week at Unknown time  . allopurinol (ZYLOPRIM) 100 MG tablet Take 100 mg by mouth at bedtime.    01/20/2020 at Unknown time  . aspirin 81 MG tablet Take 81 mg by mouth at bedtime.    01/20/2020 at Unknown time  . atorvastatin (LIPITOR) 40 MG tablet Take 40 mg by mouth at bedtime.    01/20/2020 at Unknown time  . calcium-vitamin D 250-100 MG-UNIT tablet Take 1 tablet by mouth 2 (two) times daily.   Past Week at Unknown time  . EPINEPHrine (EPIPEN 2-PAK) 0.3 mg/0.3 mL IJ SOAJ injection Inject 0.3 mg into the muscle as needed for anaphylaxis.     . hydrALAZINE (APRESOLINE) 100 MG tablet Take 100 mg by mouth 2 (two) times daily.   01/20/2020 at Unknown time  . labetalol (NORMODYNE) 200 MG tablet Take 200 mg by mouth 2 (two) times daily.  2 01/21/2020 at Unknown time  . losartan (COZAAR) 25 MG tablet Take 12.5 mg by mouth daily.   01/21/2020 at Unknown time  . Multiple Vitamin (MULTIVITAMIN WITH MINERALS) TABS tablet Take 1 tablet by mouth daily.   Past Week at Unknown time  . nitroGLYCERIN (NITROSTAT) 0.4 MG SL tablet Place 0.4 mg under the tongue every 5 (five) minutes as needed for chest pain.     . pantoprazole (PROTONIX) 40 MG tablet Take 40 mg by mouth daily.   01/20/2020 at Unknown time  . acetaminophen (TYLENOL) 500 MG  tablet Take 500-1,000 mg by mouth every 6 (six) hours as needed for mild pain or fever.      . baclofen (LIORESAL) 10 MG tablet Take 10 mg by mouth 2 (two) times daily.      . meclizine (ANTIVERT) 12.5 MG tablet Take 1 tablet (12.5 mg total) by mouth 3 (three) times daily as needed for dizziness. 30 tablet 0      Allergies  Allergen Reactions  . [employer] Anaphylaxis  . Gabapentin     Muscles jerks, weakness and difficulty swallowing - Noted in hospital stay  . Albuterol Other (See Comments)    Pt reports seizures   . Codeine Other (See Comments)    Altered mental status      Past Medical History:  Diagnosis Date  . Anxiety    Related to surgery  . Arthritis    hands  . Cancer ([place]) 1970's   cervical  . Depression    Controlled  . Esophagitis   . GERD (gastroesophageal reflux disease)   . Gout   . Hyperlipidemia   . Hypertension   . Myocardial infarction ([place])   . Osteoporosis    feet  . Pseudoseizure   . Sleep apnea    CPAP    Review of systems:  Otherwise negative.    Physical Exam  Gen: Alert, oriented. Appears stated  age.  HEENT: [place]/AT. PERRLA. Lungs: CTA, no wheezes. CV: RR nl S1, S2. Abd: soft, benign, no masses. BS+ Ext: No edema. Pulses 2+    Planned procedures: Proceed with EGD and colonoscopy. The patient understands the nature of the planned procedure, indications, risks, alternatives and potential complications including but not limited to bleeding, infection, perforation, damage to internal organs and possible oversedation/side effects from anesthesia. The patient agrees and gives consent to proceed.  Please refer to procedure notes for findings, recommendations and patient disposition/instructions.     Merek Niu K. Elaine Norris, M.D. Gastroenterology 01/21/2020  8:35 AM

## 2020-01-21 NOTE — Transfer of Care (Signed)
Immediate Anesthesia Transfer of Care Note  Patient: Elaine Norris  Procedure(s) Performed: COLONOSCOPY WITH PROPOFOL (N/A ) ESOPHAGOGASTRODUODENOSCOPY (EGD) WITH PROPOFOL (N/A )  Patient Location: PACU  Anesthesia Type:General  Level of Consciousness: drowsy  Airway & Oxygen Therapy: Patient Spontanous Breathing and Patient connected to nasal cannula oxygen  Post-op Assessment: Report given to RN and Post -op Vital signs reviewed and stable  Post vital signs: Reviewed and stable  Last Vitals:  Vitals Value Taken Time  BP 143/77 01/21/20 0928  Temp    Pulse 67 01/21/20 0928  Resp 17 01/21/20 0928  SpO2 99 % 01/21/20 0928  Vitals shown include unvalidated device data.  Last Pain:  Vitals:   01/21/20 0730  TempSrc: Tympanic  PainSc: 0-No pain         Complications: No apparent anesthesia complications

## 2020-01-21 NOTE — Anesthesia Procedure Notes (Signed)
Date/Time: 01/21/2020 8:34 AM Performed by: Johnna Acosta, CRNA Pre-anesthesia Checklist: Patient identified, Emergency Drugs available, Suction available, Patient being monitored and Timeout performed Patient Re-evaluated:Patient Re-evaluated prior to induction Oxygen Delivery Method: Nasal cannula Preoxygenation: Pre-oxygenation with 100% oxygen

## 2020-01-22 ENCOUNTER — Encounter: Payer: Self-pay | Admitting: *Deleted

## 2020-01-22 LAB — SURGICAL PATHOLOGY

## 2020-03-07 ENCOUNTER — Other Ambulatory Visit (HOSPITAL_COMMUNITY): Payer: Self-pay | Admitting: Internal Medicine

## 2020-03-07 ENCOUNTER — Other Ambulatory Visit: Payer: Self-pay | Admitting: Internal Medicine

## 2020-03-07 DIAGNOSIS — R944 Abnormal results of kidney function studies: Secondary | ICD-10-CM

## 2020-03-14 ENCOUNTER — Other Ambulatory Visit: Payer: Self-pay

## 2020-03-14 ENCOUNTER — Ambulatory Visit
Admission: RE | Admit: 2020-03-14 | Discharge: 2020-03-14 | Disposition: A | Discharge status: Home / Self Care | Payer: Medicare Other | Source: Ambulatory Visit | Attending: Internal Medicine | Admitting: Internal Medicine

## 2020-03-14 DIAGNOSIS — R944 Abnormal results of kidney function studies: Secondary | ICD-10-CM | POA: Diagnosis not present

## 2020-06-12 DIAGNOSIS — C649 Malignant neoplasm of unspecified kidney, except renal pelvis: Secondary | ICD-10-CM

## 2020-06-12 HISTORY — DX: Malignant neoplasm of unspecified kidney, except renal pelvis: C64.9

## 2020-07-05 ENCOUNTER — Ambulatory Visit
Admission: RE | Admit: 2020-07-05 | Discharge: 2020-07-05 | Disposition: A | Discharge status: Home / Self Care | Payer: Medicare Other | Source: Ambulatory Visit | Attending: Family | Admitting: Family

## 2020-07-05 ENCOUNTER — Other Ambulatory Visit: Payer: Self-pay

## 2020-07-05 DIAGNOSIS — D4861 Neoplasm of uncertain behavior of right breast: Secondary | ICD-10-CM | POA: Diagnosis not present

## 2020-07-05 DIAGNOSIS — N6310 Unspecified lump in the right breast, unspecified quadrant: Secondary | ICD-10-CM | POA: Diagnosis present

## 2021-03-06 DIAGNOSIS — D631 Anemia in chronic kidney disease: Secondary | ICD-10-CM | POA: Diagnosis not present

## 2021-03-06 DIAGNOSIS — E876 Hypokalemia: Secondary | ICD-10-CM | POA: Diagnosis not present

## 2021-03-06 DIAGNOSIS — I129 Hypertensive chronic kidney disease with stage 1 through stage 4 chronic kidney disease, or unspecified chronic kidney disease: Secondary | ICD-10-CM | POA: Diagnosis not present

## 2021-03-06 DIAGNOSIS — N2581 Secondary hyperparathyroidism of renal origin: Secondary | ICD-10-CM | POA: Diagnosis not present

## 2021-03-06 DIAGNOSIS — N1832 Chronic kidney disease, stage 3b: Secondary | ICD-10-CM | POA: Diagnosis not present

## 2021-04-04 DIAGNOSIS — M109 Gout, unspecified: Secondary | ICD-10-CM | POA: Diagnosis not present

## 2021-04-04 DIAGNOSIS — E782 Mixed hyperlipidemia: Secondary | ICD-10-CM | POA: Diagnosis not present

## 2021-04-04 DIAGNOSIS — I10 Essential (primary) hypertension: Secondary | ICD-10-CM | POA: Diagnosis not present

## 2021-04-07 ENCOUNTER — Other Ambulatory Visit: Payer: Self-pay

## 2021-04-07 ENCOUNTER — Encounter: Payer: Self-pay | Admitting: Emergency Medicine

## 2021-04-07 ENCOUNTER — Emergency Department: Payer: Medicare Other

## 2021-04-07 ENCOUNTER — Emergency Department
Admission: EM | Admit: 2021-04-07 | Discharge: 2021-04-07 | Disposition: A | Discharge status: Home / Self Care | Payer: Medicare Other | Attending: Emergency Medicine | Admitting: Emergency Medicine

## 2021-04-07 DIAGNOSIS — Z85528 Personal history of other malignant neoplasm of kidney: Secondary | ICD-10-CM | POA: Insufficient documentation

## 2021-04-07 DIAGNOSIS — Z79899 Other long term (current) drug therapy: Secondary | ICD-10-CM | POA: Diagnosis not present

## 2021-04-07 DIAGNOSIS — Z8541 Personal history of malignant neoplasm of cervix uteri: Secondary | ICD-10-CM | POA: Insufficient documentation

## 2021-04-07 DIAGNOSIS — R112 Nausea with vomiting, unspecified: Secondary | ICD-10-CM

## 2021-04-07 DIAGNOSIS — R197 Diarrhea, unspecified: Secondary | ICD-10-CM

## 2021-04-07 DIAGNOSIS — E876 Hypokalemia: Secondary | ICD-10-CM | POA: Diagnosis not present

## 2021-04-07 DIAGNOSIS — U071 COVID-19: Secondary | ICD-10-CM | POA: Diagnosis not present

## 2021-04-07 DIAGNOSIS — R9431 Abnormal electrocardiogram [ECG] [EKG]: Secondary | ICD-10-CM | POA: Diagnosis not present

## 2021-04-07 DIAGNOSIS — Z7722 Contact with and (suspected) exposure to environmental tobacco smoke (acute) (chronic): Secondary | ICD-10-CM | POA: Diagnosis not present

## 2021-04-07 DIAGNOSIS — R059 Cough, unspecified: Secondary | ICD-10-CM | POA: Diagnosis not present

## 2021-04-07 DIAGNOSIS — E860 Dehydration: Secondary | ICD-10-CM | POA: Diagnosis not present

## 2021-04-07 LAB — CBC WITH DIFFERENTIAL/PLATELET
Abs Immature Granulocytes: 0.01 10*3/uL (ref 0.00–0.07)
Basophils Absolute: 0 10*3/uL (ref 0.0–0.1)
Basophils Relative: 0 %
Eosinophils Absolute: 0 10*3/uL (ref 0.0–0.5)
Eosinophils Relative: 0 %
HCT: 35.7 % — ABNORMAL LOW (ref 36.0–46.0)
Hemoglobin: 12.3 g/dL (ref 12.0–15.0)
Immature Granulocytes: 0 %
Lymphocytes Relative: 26 %
Lymphs Abs: 1.1 10*3/uL (ref 0.7–4.0)
MCH: 31.2 pg (ref 26.0–34.0)
MCHC: 34.5 g/dL (ref 30.0–36.0)
MCV: 90.6 fL (ref 80.0–100.0)
Monocytes Absolute: 0.3 10*3/uL (ref 0.1–1.0)
Monocytes Relative: 8 %
Neutro Abs: 2.7 10*3/uL (ref 1.7–7.7)
Neutrophils Relative %: 66 %
Platelets: 157 10*3/uL (ref 150–400)
RBC: 3.94 MIL/uL (ref 3.87–5.11)
RDW: 12.6 % (ref 11.5–15.5)
WBC: 4.1 10*3/uL (ref 4.0–10.5)
nRBC: 0 % (ref 0.0–0.2)

## 2021-04-07 LAB — TROPONIN I (HIGH SENSITIVITY)
Troponin I (High Sensitivity): 22 ng/L — ABNORMAL HIGH (ref <18)
Troponin I (High Sensitivity): 19 ng/L — ABNORMAL HIGH (ref <18)

## 2021-04-07 LAB — COMPREHENSIVE METABOLIC PANEL
ALT: 26 U/L (ref 0–44)
AST: 38 U/L (ref 15–41)
Albumin: 4.2 g/dL (ref 3.5–5.0)
Alkaline Phosphatase: 56 U/L (ref 38–126)
Anion gap: 11 (ref 5–15)
BUN: 20 mg/dL (ref 8–23)
CO2: 24 mmol/L (ref 22–32)
Calcium: 9 mg/dL (ref 8.9–10.3)
Chloride: 102 mmol/L (ref 98–111)
Creatinine, Ser: 1.93 mg/dL — ABNORMAL HIGH (ref 0.44–1.00)
GFR, Estimated: 27 mL/min — ABNORMAL LOW (ref >60)
Glucose, Bld: 115 mg/dL — ABNORMAL HIGH (ref 70–99)
Potassium: 3.3 mmol/L — ABNORMAL LOW (ref 3.5–5.1)
Sodium: 137 mmol/L (ref 135–145)
Total Bilirubin: 1.1 mg/dL (ref 0.3–1.2)
Total Protein: 7.2 g/dL (ref 6.5–8.1)

## 2021-04-07 LAB — URINALYSIS, COMPLETE (UACMP) WITH MICROSCOPIC
Bacteria, UA: NONE SEEN
Bilirubin Urine: NEGATIVE
Glucose, UA: NEGATIVE mg/dL
Hgb urine dipstick: NEGATIVE
Ketones, ur: NEGATIVE mg/dL
Nitrite: NEGATIVE
Protein, ur: NEGATIVE mg/dL
Specific Gravity, Urine: 1.005 (ref 1.005–1.030)
pH: 7 (ref 5.0–8.0)

## 2021-04-07 LAB — MAGNESIUM: Magnesium: 2 mg/dL (ref 1.7–2.4)

## 2021-04-07 LAB — RESP PANEL BY RT-PCR (FLU A&B, COVID) ARPGX2
Influenza A by PCR: NEGATIVE
Influenza B by PCR: NEGATIVE
SARS Coronavirus 2 by RT PCR: POSITIVE — AB

## 2021-04-07 MED ORDER — ONDANSETRON HCL 4 MG/2ML IJ SOLN
4.0000 mg | Freq: Once | INTRAMUSCULAR | Status: AC
  Administered 2021-04-07: 4 mg via INTRAVENOUS
  Filled 2021-04-07: qty 2

## 2021-04-07 MED ORDER — ONDANSETRON 4 MG PO TBDP: 4.0000 mg | ORAL_TABLET | Freq: Three times a day (TID) | ORAL | 0 refills | Status: DC | PRN

## 2021-04-07 MED ORDER — LACTATED RINGERS IV BOLUS
1000.0000 mL | Freq: Once | INTRAVENOUS | Status: AC
  Administered 2021-04-07: 1000 mL via INTRAVENOUS

## 2021-04-07 MED ORDER — POTASSIUM CHLORIDE CRYS ER 20 MEQ PO TBCR
80.0000 meq | EXTENDED_RELEASE_TABLET | Freq: Once | ORAL | Status: AC
  Administered 2021-04-07: 80 meq via ORAL
  Filled 2021-04-07: qty 4

## 2021-04-07 MED ORDER — LACTATED RINGERS IV BOLUS
500.0000 mL | Freq: Once | INTRAVENOUS | Status: AC
  Administered 2021-04-07: 500 mL via INTRAVENOUS

## 2021-04-07 MED ORDER — LOPERAMIDE HCL 2 MG PO TABS: 2.0000 mg | ORAL_TABLET | Freq: Four times a day (QID) | ORAL | 0 refills | Status: DC | PRN

## 2021-04-07 MED ORDER — ACETAMINOPHEN 500 MG PO TABS
1000.0000 mg | ORAL_TABLET | Freq: Once | ORAL | Status: AC
  Administered 2021-04-07: 1000 mg via ORAL
  Filled 2021-04-07: qty 2

## 2021-04-07 NOTE — ED Provider Notes (Signed)
[facility] Emergency Department Provider Note  ____________________________________________   Event Date/Time   First MD Initiated Contact with Patient 04/07/21 1419     (approximate)  I have reviewed the triage vital signs and the nursing notes.   HISTORY  Chief Complaint Emesis, Diarrhea, and Generalized Body Aches   HPI Elaine Norris is a 75 y.o. female with a past medical history of anxiety, HTN, HDL, osteoporosis, OSA on CPAP at night, depression, esophagitis, GERD, gout and arthritis who presents for assessment approximately 1 week of body aches, fatigue, lightheadedness on standing, nonbloody nonbilious emesis and nausea as well as 1 day of nonbloody diarrhea.  He states she feels uncomfortable chest tightness when she is coughing a lot but no chest tightness or other pain in between.  She denies any shortness of breath or productive cough.  She denies any abdominal pain, urinary symptoms, back pain, rash or recent falls or injuries.  Denies any other acute concerns at this time.         Past Medical History:  Diagnosis Date  . Anxiety    Related to surgery  . Arthritis    hands  . Cancer ([place]) 1970's   cervical  . Depression    Controlled  . Esophagitis   . GERD (gastroesophageal reflux disease)   . Gout   . Hyperlipidemia   . Hypertension   . Kidney carcinoma ([place]) 06/2020  . Myocardial infarction ([place])   . Osteoporosis    feet  . Pseudoseizure   . Sleep apnea    CPAP    Patient Active Problem List   Diagnosis Date Noted  . Anaphylaxis 07/21/2019  . Seizure ([place]) 06/26/2018  . Jerking 02/25/2017  . Chest pain 11/12/2012  . Hypertension   . Hyperlipidemia     Past Surgical History:  Procedure Laterality Date  . CARDIAC CATHETERIZATION  05/2011   [facility]  . CARDIAC [facility]IZATION  09/2011   [facility]  . CARDIAC CATHETERIZATION  2012   [facility]  . CATARACT EXTRACTION W/PHACO Left 02/17/2018   Procedure: CATARACT EXTRACTION  PHACO AND INTRAOCULAR LENS PLACEMENT ([place]) LEFT;  Surgeon: Leandrew Koyanagi, MD;  Location: [place];  Service: Ophthalmology;  Laterality: Left;  . CATARACT EXTRACTION W/PHACO Right 03/10/2018   Procedure: CATARACT EXTRACTION PHACO AND INTRAOCULAR LENS PLACEMENT ([place]) RIGHT  COMPLICATED;  Surgeon: Leandrew Koyanagi, MD;  Location: [place];  Service: Ophthalmology;  Laterality: Right;  NEEDS EARLY AM 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 maly[place]in  . CHOLECYSTECTOMY N/A 06/26/2018   Procedure: LAPAROSCOPIC CHOLECYSTECTOMY;  Surgeon: Kinsinger, Arta Bruce, MD;  Location: [facility];  Service: General;  Laterality: N/A;  . COLONOSCOPY    . COLONOSCOPY WITH PROPOFOL N/A 01/21/2020   Procedure: COLONOSCOPY WITH PROPOFOL;  Surgeon: Toledo, Benay Pike, MD;  Location: [facility];  Service: Gastroenterology;  Laterality: N/A;  . ESOPHAGOGASTRODUODENOSCOPY (EGD) WITH PROPOFOL N/A 01/21/2020   Procedure: ESOPHAGOGASTRODUODENOSCOPY (EGD) WITH PROPOFOL;  Surgeon: Toledo, Benay Pike, MD;  Location: [facility];  Service: Gastroenterology;  Laterality: N/A;  . MULTIPLE TOOTH EXTRACTIONS    . TONSILLECTOMY    . WISDOM TOOTH EXTRACTION      Prior to Admission medications   Medication Sig Start Date End Date Taking? Authorizing Provider  acetaminophen (TYLENOL) 500 MG tablet Take 500-1,000 mg by mouth every 6 (six) hours as needed for mild pain or fever.     [provider]  alendronate (FOSAMAX) 70 MG tablet Take 70 mg by mouth every Saturday.  01/03/17  [provider]  allopurinol (ZYLOPRIM) 100 MG tablet Take 100 mg by mouth at bedtime.  01/05/17   [provider]  aspirin 81 MG tablet Take 81 mg by mouth at bedtime.     [provider]  atorvastatin (LIPITOR) 40 MG tablet Take 40 mg by mouth at bedtime.  12/15/16   [provider]  baclofen (LIORESAL) 10 MG tablet Take 10 mg by mouth 2 (two) times daily.     [provider]   calcium-vitamin D 250-100 MG-UNIT tablet Take 1 tablet by mouth 2 (two) times daily.    [provider]  EPINEPHrine (EPIPEN 2-PAK) 0.3 mg/0.3 mL IJ SOAJ injection Inject 0.3 mg into the muscle as needed for anaphylaxis.    [provider]  hydrALAZINE (APRESOLINE) 100 MG tablet Take 100 mg by mouth 2 (two) times daily.    [provider]  labetalol (NORMODYNE) 200 MG tablet Take 200 mg by mouth 2 (two) times daily. 03/13/18   [provider]  losartan (COZAAR) 25 MG tablet Take 12.5 mg by mouth daily.    [provider]  meclizine (ANTIVERT) 12.5 MG tablet Take 1 tablet (12.5 mg total) by mouth 3 (three) times daily as needed for dizziness. 03/29/19   Gregor Hams, MD  Multiple Vitamin (MULTIVITAMIN WITH MINERALS) TABS tablet Take 1 tablet by mouth daily.    [provider]  nitroGLYCERIN (NITROSTAT) 0.4 MG SL tablet Place 0.4 mg under the tongue every 5 (five) minutes as needed for chest pain.    [provider]  pantoprazole (PROTONIX) 40 MG tablet Take 40 mg by mouth daily.    [provider]    Allergies Fire ant, Gabapentin, Albuterol, and Codeine  Family History  Problem Relation Age of Onset  . Heart disease Mother   . Heart failure Father   . Breast cancer Neg Hx     Social History Social History   Tobacco Use  . Smoking status: Passive Smoke Exposure - Never Smoker  . Smokeless tobacco: Never Used  Vaping Use  . Vaping Use: Never used  Substance Use Topics  . Alcohol use: No  . Drug use: No    Review of Systems  Review of Systems  Constitutional: Positive for malaise/fatigue. Negative for chills and fever.  HENT: Negative for sore throat.   Eyes: Negative for pain.  Respiratory: Positive for cough. Negative for stridor.   Cardiovascular: Positive for chest pain ( tightness w/ coughing).  Gastrointestinal: Positive for diarrhea, nausea and vomiting.  Musculoskeletal: Positive for myalgias.   Skin: Negative for rash.  Neurological: Positive for weakness. Negative for seizures, loss of consciousness and headaches.  Psychiatric/Behavioral: Negative for suicidal ideas.  All other systems reviewed and are negative.     ____________________________________________   PHYSICAL EXAM:  VITAL SIGNS: ED Triage Vitals  Enc Vitals Group     BP 04/07/21 1217 100/61     Pulse Rate 04/07/21 1217 70     Resp 04/07/21 1217 18     Temp 04/07/21 1217 98.5 F (36.9 C)     Temp Source 04/07/21 1217 Oral     SpO2 04/07/21 1217 99 %     Weight 04/07/21 1218 170 lb (77.1 kg)     Height 04/07/21 1218 5\' 5"  (1.651 m)     Head Circumference --      Peak Flow --      Pain Score 04/07/21 1218 0     Pain Loc --  Pain Edu? --      Excl. in [place]? --    Vitals:   04/07/21 1217 04/07/21 1423  BP: 100/61 (!) 143/77  Pulse: 70 (!) 57  Resp: 18 17  Temp: 98.5 F (36.9 C)   SpO2: 99% 100%   Physical Exam Vitals and nursing note reviewed.  Constitutional:      General: She is not in acute distress.    Appearance: She is well-developed.  HENT:     Head: Normocephalic and atraumatic.     Right Ear: External ear normal.     Left Ear: External ear normal.     Mouth/Throat:     Mouth: Mucous membranes are dry.  Eyes:     Conjunctiva/sclera: Conjunctivae normal.  Cardiovascular:     Rate and Rhythm: Normal rate and regular rhythm.     Heart sounds: No murmur heard.   Pulmonary:     Effort: Pulmonary effort is normal. No respiratory distress.     Breath sounds: Normal breath sounds.  Abdominal:     Palpations: Abdomen is soft.     Tenderness: There is no abdominal tenderness.  Musculoskeletal:     Cervical back: Neck supple.  Skin:    General: Skin is warm and dry.     Capillary Refill: Capillary refill takes more than 3 seconds.  Neurological:     Mental Status: She is alert.  Psychiatric:        Mood and Affect: Mood normal.       ____________________________________________   LABS (all labs ordered are listed, but only abnormal results are displayed)  Labs Reviewed  CBC WITH DIFFERENTIAL/PLATELET - Abnormal; Notable for the following components:      Result Value   HCT 35.7 (*)    All other components within normal limits  COMPREHENSIVE METABOLIC PANEL - Abnormal; Notable for the following components:   Potassium 3.3 (*)    Glucose, Bld 115 (*)    Creatinine, Ser 1.93 (*)    GFR, Estimated 27 (*)    All other components within normal limits  TROPONIN I (HIGH SENSITIVITY) - Abnormal; Notable for the following components:   Troponin I (High Sensitivity) 22 (*)    All other components within normal limits  RESP PANEL BY RT-PCR (FLU A&B, COVID) ARPGX2  MAGNESIUM  URINALYSIS, COMPLETE (UACMP) WITH MICROSCOPIC   ____________________________________________  EKG  Sinus bradycardia with ventricular rate of 51, right axis deviation, QTc interval of 522 without other evidence of clear acute ischemia or significant underlying arrhythmia.  Nonspecific change in aVL. ____________________________________________  RADIOLOGY  ED MD interpretation:  Chest x-ray has no evidence of pneumonia, heart failure, effusion or other clear acute intrathoracic process.  Official radiology report(s): DG Chest 2 View  Result Date: 04/07/2021 CLINICAL DATA:  Cough EXAM: CHEST - 2 VIEW COMPARISON:  06/20/2017 FINDINGS: The heart size and mediastinal contours are within normal limits. Both lungs are clear. The visualized skeletal structures are unremarkable. IMPRESSION: No active cardiopulmonary disease. Electronically Signed   By: Miachel Roux M.D.   On: 04/07/2021 14:37    ____________________________________________   PROCEDURES  Procedure(s) performed (including Critical Care):  .1-3 Lead EKG Interpretation Performed by: Lucrezia Starch, MD Authorized by: Lucrezia Starch, MD     Interpretation: normal     ECG rate  assessment: bradycardic     Rhythm: sinus rhythm     Conduction: normal       ____________________________________________   INITIAL IMPRESSION / ASSESSMENT AND PLAN /  ED COURSE      Patient presents with above-stated history exam for assessment approximate 1 week of myalgias, cough, fatigue and lightheadedness on standing and some nonbloody nonbilious emesis and day of diarrhea.  On arrival she is afebrile and hemodynamically stable.  Primary differential includes acute bacterial pneumonia, viral bronchitis with component of gastroenteritis, metabolic derangements, cystitis, cholecystitis, pancreatitis possible atypical presentation of ACS or arrhythmia.   Chest x-ray has no evidence of pneumonia, heart failure, effusion or other clear acute intrathoracic process.  CBC without leukocytosis or acute anemia.  CMP remarkable for K of 3.3 and a creatinine of 1.93 compared to baseline between 1.04 and 1.81.  Most recently patient's creatinine was 1.72 on 4/25.  Magnesium is WNL.  Care patient signed over to oncoming provider approximately 1515.  Plan is to follow-up troponin, UA and COVID influenza test.  In addition patient will require orthostatic testing and trial of ambulation after she has IV fluids and Zofran.  ____________________________________________   FINAL CLINICAL IMPRESSION(S) / ED DIAGNOSES  Final diagnoses:  Dehydration  Nausea vomiting and diarrhea  Hypokalemia    Medications  potassium chloride SA (KLOR-CON) CR tablet 80 mEq (80 mEq Oral Given 04/07/21 1443)  lactated ringers bolus 1,000 mL (1,000 mLs Intravenous New Bag/Given 04/07/21 1442)  acetaminophen (TYLENOL) tablet 1,000 mg (1,000 mg Oral Given 04/07/21 1453)  ondansetron (ZOFRAN) injection 4 mg (4 mg Intravenous Given 04/07/21 1453)     ED Discharge Orders    None       Note:  This document was prepared using Dragon voice recognition software and may include unintentional dictation errors.    Lucrezia Starch, MD 04/07/21 [phone]

## 2021-04-07 NOTE — ED Triage Notes (Signed)
Pt comes into the ED via POV c/o N/V/D throughout the week.  Pt states she has also had a cough, and body aches.  Pt in NAD with even and unlabored respirations.  Pt denies that she has been around anyone that tested positive for flu or COVID.  Pt states she and her son both have been sick.  Pt denies any other pain other than body aches.

## 2021-04-07 NOTE — ED Provider Notes (Signed)
Assumed care from Dr. Tamala Julian.  Patient is a 75 year old female here with generalized weakness in the setting of diarrheal illness.  Patient is COVID-positive.  Chest x-ray clear and she has no shortness of breath or hypoxia.  Lab work is consistent with mild dehydration but otherwise largely unremarkable.  Troponin 22, EKG nonischemic, likely demand and related to her CKD but will recheck.  Plan is to give fluids, reassess, ambulate, likely discharge.  Repeat troponin unchanged.  Patient feels better after IV fluids.  She is amatory without difficulty and tolerating p.o.  Will discharge with Paxil bed, supportive care, encourage hydration at home.  Her son lives with her and can help her at home.   Duffy Bruce, MD 04/07/21 [phone]

## 2021-04-07 NOTE — Discharge Instructions (Signed)
For your COVID:  You may be a candidate for antibody therapy. Call the infusion clinic above.  Take the prescribed meds for symptoms   Drink plenty of fluid

## 2021-04-25 ENCOUNTER — Other Ambulatory Visit: Payer: Self-pay | Admitting: Internal Medicine

## 2021-04-25 DIAGNOSIS — Z1231 Encounter for screening mammogram for malignant neoplasm of breast: Secondary | ICD-10-CM

## 2021-04-25 DIAGNOSIS — R799 Abnormal finding of blood chemistry, unspecified: Secondary | ICD-10-CM | POA: Diagnosis not present

## 2021-04-25 DIAGNOSIS — M109 Gout, unspecified: Secondary | ICD-10-CM | POA: Diagnosis not present

## 2021-04-25 DIAGNOSIS — I119 Hypertensive heart disease without heart failure: Secondary | ICD-10-CM | POA: Diagnosis not present

## 2021-04-25 DIAGNOSIS — I10 Essential (primary) hypertension: Secondary | ICD-10-CM | POA: Diagnosis not present

## 2021-04-25 DIAGNOSIS — N6310 Unspecified lump in the right breast, unspecified quadrant: Secondary | ICD-10-CM

## 2021-04-25 DIAGNOSIS — E782 Mixed hyperlipidemia: Secondary | ICD-10-CM | POA: Diagnosis not present

## 2021-05-18 DIAGNOSIS — E782 Mixed hyperlipidemia: Secondary | ICD-10-CM | POA: Diagnosis not present

## 2021-05-18 DIAGNOSIS — I10 Essential (primary) hypertension: Secondary | ICD-10-CM | POA: Diagnosis not present

## 2021-05-25 DIAGNOSIS — E782 Mixed hyperlipidemia: Secondary | ICD-10-CM | POA: Diagnosis not present

## 2021-05-25 DIAGNOSIS — I10 Essential (primary) hypertension: Secondary | ICD-10-CM | POA: Diagnosis not present

## 2021-05-25 DIAGNOSIS — M109 Gout, unspecified: Secondary | ICD-10-CM | POA: Diagnosis not present

## 2021-05-26 DIAGNOSIS — R0602 Shortness of breath: Secondary | ICD-10-CM | POA: Diagnosis not present

## 2021-05-26 DIAGNOSIS — E782 Mixed hyperlipidemia: Secondary | ICD-10-CM | POA: Diagnosis not present

## 2021-05-26 DIAGNOSIS — I119 Hypertensive heart disease without heart failure: Secondary | ICD-10-CM | POA: Diagnosis not present

## 2021-05-26 DIAGNOSIS — I10 Essential (primary) hypertension: Secondary | ICD-10-CM | POA: Diagnosis not present

## 2021-05-26 DIAGNOSIS — R079 Chest pain, unspecified: Secondary | ICD-10-CM | POA: Diagnosis not present

## 2021-05-26 DIAGNOSIS — R0782 Intercostal pain: Secondary | ICD-10-CM | POA: Diagnosis not present

## 2021-06-02 DIAGNOSIS — R079 Chest pain, unspecified: Secondary | ICD-10-CM | POA: Diagnosis not present

## 2021-06-02 DIAGNOSIS — R0602 Shortness of breath: Secondary | ICD-10-CM | POA: Diagnosis not present

## 2021-06-02 DIAGNOSIS — I10 Essential (primary) hypertension: Secondary | ICD-10-CM | POA: Diagnosis not present

## 2021-06-05 DIAGNOSIS — N2581 Secondary hyperparathyroidism of renal origin: Secondary | ICD-10-CM | POA: Diagnosis not present

## 2021-06-05 DIAGNOSIS — N1832 Chronic kidney disease, stage 3b: Secondary | ICD-10-CM | POA: Diagnosis not present

## 2021-06-05 DIAGNOSIS — E876 Hypokalemia: Secondary | ICD-10-CM | POA: Diagnosis not present

## 2021-06-05 DIAGNOSIS — I129 Hypertensive chronic kidney disease with stage 1 through stage 4 chronic kidney disease, or unspecified chronic kidney disease: Secondary | ICD-10-CM | POA: Diagnosis not present

## 2021-06-05 DIAGNOSIS — D631 Anemia in chronic kidney disease: Secondary | ICD-10-CM | POA: Diagnosis not present

## 2021-06-08 DIAGNOSIS — I10 Essential (primary) hypertension: Secondary | ICD-10-CM | POA: Diagnosis not present

## 2021-06-08 DIAGNOSIS — E782 Mixed hyperlipidemia: Secondary | ICD-10-CM | POA: Diagnosis not present

## 2021-06-08 DIAGNOSIS — R0782 Intercostal pain: Secondary | ICD-10-CM | POA: Diagnosis not present

## 2021-06-08 DIAGNOSIS — I119 Hypertensive heart disease without heart failure: Secondary | ICD-10-CM | POA: Diagnosis not present

## 2021-07-06 ENCOUNTER — Ambulatory Visit
Admission: RE | Admit: 2021-07-06 | Discharge: 2021-07-06 | Disposition: A | Discharge status: Home / Self Care | Payer: Medicare Other | Source: Ambulatory Visit | Attending: Internal Medicine | Admitting: Internal Medicine

## 2021-07-06 ENCOUNTER — Other Ambulatory Visit: Payer: Self-pay

## 2021-07-06 DIAGNOSIS — N6310 Unspecified lump in the right breast, unspecified quadrant: Secondary | ICD-10-CM

## 2021-07-06 DIAGNOSIS — N6312 Unspecified lump in the right breast, upper inner quadrant: Secondary | ICD-10-CM | POA: Insufficient documentation

## 2021-07-06 DIAGNOSIS — R922 Inconclusive mammogram: Secondary | ICD-10-CM | POA: Diagnosis not present

## 2021-07-27 ENCOUNTER — Other Ambulatory Visit: Payer: Self-pay

## 2021-07-27 ENCOUNTER — Emergency Department: Payer: Medicare Other

## 2021-07-27 ENCOUNTER — Observation Stay
Admission: EM | Admit: 2021-07-27 | Discharge: 2021-07-29 | Disposition: A | Discharge status: Home / Self Care | Payer: Medicare Other | Attending: Student | Admitting: Student

## 2021-07-27 DIAGNOSIS — N184 Chronic kidney disease, stage 4 (severe): Secondary | ICD-10-CM | POA: Insufficient documentation

## 2021-07-27 DIAGNOSIS — Z8541 Personal history of malignant neoplasm of cervix uteri: Secondary | ICD-10-CM | POA: Insufficient documentation

## 2021-07-27 DIAGNOSIS — W19XXXA Unspecified fall, initial encounter: Secondary | ICD-10-CM | POA: Diagnosis not present

## 2021-07-27 DIAGNOSIS — R9431 Abnormal electrocardiogram [ECG] [EKG]: Secondary | ICD-10-CM | POA: Diagnosis not present

## 2021-07-27 DIAGNOSIS — Z79899 Other long term (current) drug therapy: Secondary | ICD-10-CM | POA: Insufficient documentation

## 2021-07-27 DIAGNOSIS — Z23 Encounter for immunization: Secondary | ICD-10-CM | POA: Diagnosis not present

## 2021-07-27 DIAGNOSIS — G4733 Obstructive sleep apnea (adult) (pediatric): Secondary | ICD-10-CM | POA: Diagnosis not present

## 2021-07-27 DIAGNOSIS — I10 Essential (primary) hypertension: Secondary | ICD-10-CM | POA: Diagnosis present

## 2021-07-27 DIAGNOSIS — E860 Dehydration: Secondary | ICD-10-CM | POA: Diagnosis not present

## 2021-07-27 DIAGNOSIS — Z7982 Long term (current) use of aspirin: Secondary | ICD-10-CM | POA: Insufficient documentation

## 2021-07-27 DIAGNOSIS — R6889 Other general symptoms and signs: Secondary | ICD-10-CM | POA: Diagnosis not present

## 2021-07-27 DIAGNOSIS — Z7722 Contact with and (suspected) exposure to environmental tobacco smoke (acute) (chronic): Secondary | ICD-10-CM | POA: Insufficient documentation

## 2021-07-27 DIAGNOSIS — R2689 Other abnormalities of gait and mobility: Secondary | ICD-10-CM | POA: Diagnosis not present

## 2021-07-27 DIAGNOSIS — W010XXA Fall on same level from slipping, tripping and stumbling without subsequent striking against object, initial encounter: Secondary | ICD-10-CM | POA: Insufficient documentation

## 2021-07-27 DIAGNOSIS — R7989 Other specified abnormal findings of blood chemistry: Secondary | ICD-10-CM

## 2021-07-27 DIAGNOSIS — I129 Hypertensive chronic kidney disease with stage 1 through stage 4 chronic kidney disease, or unspecified chronic kidney disease: Secondary | ICD-10-CM | POA: Insufficient documentation

## 2021-07-27 DIAGNOSIS — K219 Gastro-esophageal reflux disease without esophagitis: Secondary | ICD-10-CM | POA: Diagnosis not present

## 2021-07-27 DIAGNOSIS — R55 Syncope and collapse: Principal | ICD-10-CM | POA: Diagnosis present

## 2021-07-27 DIAGNOSIS — E876 Hypokalemia: Secondary | ICD-10-CM

## 2021-07-27 DIAGNOSIS — S0990XA Unspecified injury of head, initial encounter: Secondary | ICD-10-CM | POA: Diagnosis not present

## 2021-07-27 DIAGNOSIS — R0602 Shortness of breath: Secondary | ICD-10-CM | POA: Diagnosis not present

## 2021-07-27 DIAGNOSIS — R519 Headache, unspecified: Secondary | ICD-10-CM | POA: Diagnosis not present

## 2021-07-27 DIAGNOSIS — E785 Hyperlipidemia, unspecified: Secondary | ICD-10-CM

## 2021-07-27 DIAGNOSIS — Y92009 Unspecified place in unspecified non-institutional (private) residence as the place of occurrence of the external cause: Secondary | ICD-10-CM | POA: Insufficient documentation

## 2021-07-27 DIAGNOSIS — Z743 Need for continuous supervision: Secondary | ICD-10-CM | POA: Diagnosis not present

## 2021-07-27 DIAGNOSIS — Z20822 Contact with and (suspected) exposure to covid-19: Secondary | ICD-10-CM | POA: Insufficient documentation

## 2021-07-27 DIAGNOSIS — G4730 Sleep apnea, unspecified: Secondary | ICD-10-CM | POA: Diagnosis present

## 2021-07-27 DIAGNOSIS — R52 Pain, unspecified: Secondary | ICD-10-CM | POA: Diagnosis not present

## 2021-07-27 LAB — URINALYSIS, COMPLETE (UACMP) WITH MICROSCOPIC
Bacteria, UA: NONE SEEN
Bilirubin Urine: NEGATIVE
Glucose, UA: NEGATIVE mg/dL
Hgb urine dipstick: NEGATIVE
Ketones, ur: NEGATIVE mg/dL
Leukocytes,Ua: NEGATIVE
Nitrite: NEGATIVE
Protein, ur: NEGATIVE mg/dL
Specific Gravity, Urine: 1.014 (ref 1.005–1.030)
pH: 5 (ref 5.0–8.0)

## 2021-07-27 LAB — CREATININE, URINE, RANDOM: Creatinine, Urine: 201 mg/dL

## 2021-07-27 LAB — BASIC METABOLIC PANEL
Anion gap: 11 (ref 5–15)
BUN: 34 mg/dL — ABNORMAL HIGH (ref 8–23)
CO2: 29 mmol/L (ref 22–32)
Calcium: 9.2 mg/dL (ref 8.9–10.3)
Chloride: 98 mmol/L (ref 98–111)
Creatinine, Ser: 2.13 mg/dL — ABNORMAL HIGH (ref 0.44–1.00)
GFR, Estimated: 24 mL/min — ABNORMAL LOW (ref >60)
Glucose, Bld: 114 mg/dL — ABNORMAL HIGH (ref 70–99)
Potassium: 3.4 mmol/L — ABNORMAL LOW (ref 3.5–5.1)
Sodium: 138 mmol/L (ref 135–145)

## 2021-07-27 LAB — CBC
HCT: 33.2 % — ABNORMAL LOW (ref 36.0–46.0)
Hemoglobin: 11.3 g/dL — ABNORMAL LOW (ref 12.0–15.0)
MCH: 31.6 pg (ref 26.0–34.0)
MCHC: 34 g/dL (ref 30.0–36.0)
MCV: 92.7 fL (ref 80.0–100.0)
Platelets: 193 10*3/uL (ref 150–400)
RBC: 3.58 MIL/uL — ABNORMAL LOW (ref 3.87–5.11)
RDW: 13 % (ref 11.5–15.5)
WBC: 7.8 10*3/uL (ref 4.0–10.5)
nRBC: 0 % (ref 0.0–0.2)

## 2021-07-27 LAB — RESP PANEL BY RT-PCR (FLU A&B, COVID) ARPGX2
Influenza A by PCR: NEGATIVE
Influenza B by PCR: NEGATIVE
SARS Coronavirus 2 by RT PCR: NEGATIVE

## 2021-07-27 LAB — MAGNESIUM: Magnesium: 2.1 mg/dL (ref 1.7–2.4)

## 2021-07-27 LAB — TROPONIN I (HIGH SENSITIVITY)
Troponin I (High Sensitivity): 16 ng/L (ref <18)
Troponin I (High Sensitivity): 12 ng/L (ref <18)

## 2021-07-27 LAB — SODIUM, URINE, RANDOM: Sodium, Ur: <10 mmol/L

## 2021-07-27 LAB — LACTIC ACID, PLASMA: Lactic Acid, Venous: 1.1 mmol/L (ref 0.5–1.9)

## 2021-07-27 MED ORDER — SODIUM CHLORIDE 0.9 % IV BOLUS
500.0000 mL | Freq: Once | INTRAVENOUS | Status: AC
  Administered 2021-07-27: 500 mL via INTRAVENOUS

## 2021-07-27 MED ORDER — ASPIRIN EC 81 MG PO TBEC
81.0000 mg | DELAYED_RELEASE_TABLET | Freq: Every day | ORAL | Status: DC
  Administered 2021-07-28: 81 mg via ORAL
  Filled 2021-07-27: qty 1

## 2021-07-27 MED ORDER — PANTOPRAZOLE SODIUM 40 MG PO TBEC
40.0000 mg | DELAYED_RELEASE_TABLET | Freq: Every day | ORAL | Status: DC
  Administered 2021-07-28 – 2021-07-29 (×2): 40 mg via ORAL
  Filled 2021-07-27 (×2): qty 1

## 2021-07-27 MED ORDER — POTASSIUM CHLORIDE CRYS ER 20 MEQ PO TBCR
20.0000 meq | EXTENDED_RELEASE_TABLET | Freq: Once | ORAL | Status: AC
  Administered 2021-07-27: 20 meq via ORAL
  Filled 2021-07-27: qty 1

## 2021-07-27 MED ORDER — ACETAMINOPHEN 325 MG PO TABS: 650.0000 mg | ORAL_TABLET | Freq: Four times a day (QID) | ORAL | Status: DC | PRN

## 2021-07-27 MED ORDER — LORAZEPAM 0.5 MG PO TABS: 0.5000 mg | ORAL_TABLET | Freq: Two times a day (BID) | ORAL | Status: DC | PRN

## 2021-07-27 MED ORDER — ACETAMINOPHEN 325 MG RE SUPP
650.0000 mg | Freq: Four times a day (QID) | RECTAL | Status: DC | PRN
  Filled 2021-07-27: qty 2

## 2021-07-27 MED ORDER — HYDRALAZINE HCL 50 MG PO TABS
100.0000 mg | ORAL_TABLET | Freq: Two times a day (BID) | ORAL | Status: DC
  Administered 2021-07-27 – 2021-07-29 (×4): 100 mg via ORAL
  Filled 2021-07-27 (×4): qty 2

## 2021-07-27 MED ORDER — LACTATED RINGERS IV SOLN: INTRAVENOUS | Status: DC

## 2021-07-27 MED ORDER — ATORVASTATIN CALCIUM 20 MG PO TABS
40.0000 mg | ORAL_TABLET | Freq: Every day | ORAL | Status: DC
  Administered 2021-07-27 – 2021-07-28 (×2): 40 mg via ORAL
  Filled 2021-07-27 (×2): qty 2

## 2021-07-27 NOTE — ED Notes (Addendum)
Cleaned wound on left elbow from pt fall with saline and gauze and wrapped with Telfa and cloth tape. Patient had approx. 1 inch area on elbow with blood and some superficial abrasions. Some mild bruising around site. Patient declined ice pack.  Pt also has small dime sized minor abrasion on right knee from fall- no wound care needed.

## 2021-07-27 NOTE — Plan of Care (Signed)

## 2021-07-27 NOTE — ED Notes (Signed)
Scrape on L elbow cleaned and covered

## 2021-07-27 NOTE — ED Notes (Signed)
Pt was assisted to toilet by [facility] RN and Matt RN- unable to obtain urine sample d/t pt missed hat and placed toilet paper in hat- Dr Ellender Hose aware

## 2021-07-27 NOTE — ED Triage Notes (Signed)
Pt comes into the ED via [facility] from a parking lot where she had a mechanical fall from where she slipped and fell.  Pt c/o pain to the back of her head.  Pt denies any LOC and denies any blood thinners other than ASA.  Pt neurologically intact with EMS. Pt A&Ox4.   120/55 67 HR, NSR 96% RA

## 2021-07-27 NOTE — ED Provider Notes (Signed)
[facility] Emergency Department Provider Note  ____________________________________________   Event Date/Time   First MD Initiated Contact with Patient 07/27/21 1146     (approximate)  I have reviewed the triage vital signs and the nursing notes.   HISTORY  Chief Complaint Fall    HPI Elaine Norris is a 75 y.o. female  here with fall. Pt states that she was in a parking lot today, had no preceding symptoms just awoke after hitting her head on the ground. She states that she had not been feeling well before, has been feeling dehydrated. No n/v/d. She has had some mild SOB with exertion for the past several days, which  is new. Sometimes SOB at rest as well. Pt has also had some mild increase in baseline palpitations, though she's had these for some time. No recent med changes. She does stay she has been wearing incontinence briefs for increased frequency/nocturia. Denies any recent medication changes. No other complaints.   Past Medical History:  Diagnosis Date   Anxiety    Related to surgery   Arthritis    hands   Cancer ([place]) 1970's   cervical   Depression    Controlled   Esophagitis    GERD (gastroesophageal reflux disease)    Gout    Hyperlipidemia    Hypertension    Kidney carcinoma ([place]) 06/2020   Myocardial infarction ([place])    Osteoporosis    feet   Pseudoseizure    Sleep apnea    CPAP    Patient Active Problem List   Diagnosis Date Noted   Anaphylaxis 07/21/2019   Seizure ([place]) 06/26/2018   Jerking 02/25/2017   Chest pain 11/12/2012   Hypertension    Hyperlipidemia     Past Surgical History:  Procedure Laterality Date   CARDIAC CATHETERIZATION  05/2011   [facility]   CARD[facility]  09/2011   [facility]   CARDIAC [facility]IZATION  2012   [facility]   CATARACT EXTRACTION W/PHACO Left 02/17/2018   Procedure: CATARACT EXTRACTION PHACO AND INTRAOCULAR LENS PLACEMENT ([place]) LEFT;  Surgeon: Leandrew Koyanagi, MD;  Location: [place];  Service: Ophthalmology;  Laterality: Left;   CATARACT EXTRACTION W/PHACO Right 03/10/2018   Procedure: CATARACT EXTRACTION PHACO AND INTRAOCULAR LENS PLACEMENT ([place]) RIGHT  COMPLICATED;  Surgeon: Leandrew Koyanagi, MD;  Location: [place];  Service: Ophthalmology;  Laterality: Right;  NEEDS EARLY AM 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 [place]   CHOLECYSTECTOMY N/A 06/26/2018   Procedure: LAPAROSCOPIC CHOLECYSTECTOMY;  Surgeon: Kieth Brightly Arta Bruce, MD;  Location: [facility];  Service: General;  Laterality: N/A;   COLONOSCOPY     COLONOSCOPY WITH PROPOFOL N/A 01/21/2020   Procedure: COLONOSCOPY WITH PROPOFOL;  Surgeon: Toledo, Benay Pike, MD;  Location: [facility];  Service: Gastroenterology;  Laterality: N/A;   ESOPHAGOGASTRODUODENOSCOPY (EGD) WITH PROPOFOL N/A 01/21/2020   Procedure: ESOPHAGOGASTRODUODENOSCOPY (EGD) WITH PROPOFOL;  Surgeon: Toledo, Benay Pike, MD;  Location: [facility];  Service: Gastroenterology;  Laterality: N/A;   MULTIPLE TOOTH EXTRACTIONS     TONSILLECTOMY     WISDOM TOOTH EXTRACTION      Prior to Admission medications   Medication Sig Start Date End Date Taking? Authorizing Provider  acetaminophen (TYLENOL) 500 MG tablet Take 500-1,000 mg by mouth every 6 (six) hours as needed for mild pain or fever.     [provider]  alendronate (FOSAMAX) 70 MG tablet Take 70 mg by mouth every Saturday.  01/03/17   [provider]  allopurinol (ZYLOPRIM) 100  MG tablet Take 100 mg by mouth at bedtime.  01/05/17   [provider]  aspirin 81 MG tablet Take 81 mg by mouth at bedtime.     [provider]  atorvastatin (LIPITOR) 40 MG tablet Take 40 mg by mouth at bedtime.  12/15/16   [provider]  baclofen (LIORESAL) 10 MG tablet Take 10 mg by mouth 2 (two) times daily.     [provider]  calcium-vitamin D 250-100 MG-UNIT tablet Take 1 tablet by mouth 2 (two) times daily.    [provider]   EPINEPHrine (EPIPEN 2-PAK) 0.3 mg/0.3 mL IJ SOAJ injection Inject 0.3 mg into the muscle as needed for anaphylaxis.    [provider]  hydrALAZINE (APRESOLINE) 100 MG tablet Take 100 mg by mouth 2 (two) times daily.    [provider]  labetalol (NORMODYNE) 200 MG tablet Take 200 mg by mouth 2 (two) times daily. 03/13/18   [provider]  loperamide (IMODIUM A-D) 2 MG tablet Take 1 tablet (2 mg total) by mouth 4 (four) times daily as needed for diarrhea or loose stools. 04/07/21   Duffy Bruce, MD  losartan (COZAAR) 25 MG tablet Take 12.5 mg by mouth daily.    [provider]  meclizine (ANTIVERT) 12.5 MG tablet Take 1 tablet (12.5 mg total) by mouth 3 (three) times daily as needed for dizziness. 03/29/19   Gregor Hams, MD  Multiple Vitamin (MULTIVITAMIN WITH MINERALS) TABS tablet Take 1 tablet by mouth daily.    [provider]  nitroGLYCERIN (NITROSTAT) 0.4 MG SL tablet Place 0.4 mg under the tongue every 5 (five) minutes as needed for chest pain.    [provider]  ondansetron (ZOFRAN ODT) 4 MG disintegrating tablet Take 1 tablet (4 mg total) by mouth every 8 (eight) hours as needed for nausea or vomiting. 04/07/21   Duffy Bruce, MD  pantoprazole (PROTONIX) 40 MG tablet Take 40 mg by mouth daily.    [provider]    Allergies Fire ant, Gabapentin, Albuterol, and Codeine  Family History  Problem Relation Age of Onset   Heart disease Mother    Heart failure Father    Breast cancer Neg Hx     Social History Social History   Tobacco Use   Smoking status: Passive Smoke Exposure - Never Smoker   Smokeless tobacco: Never  Vaping Use   Vaping Use: Never used  Substance Use Topics   Alcohol use: No   Drug use: No    Review of Systems  Review of Systems  Constitutional:  Positive for fatigue. Negative for chills and fever.  HENT:  Negative for sore throat.   Respiratory:  Negative for shortness of breath.    Cardiovascular:  Negative for chest pain.  Gastrointestinal:  Negative for abdominal pain.  Genitourinary:  Negative for flank pain.  Musculoskeletal:  Negative for neck pain.  Skin:  Negative for rash and wound.  Allergic/Immunologic: Negative for immunocompromised state.  Neurological:  Positive for weakness and headaches (after fall). Negative for numbness.  Hematological:  Does not bruise/bleed easily.  All other systems reviewed and are negative.   ____________________________________________  PHYSICAL EXAM:      VITAL SIGNS: ED Triage Vitals  Enc Vitals Group     BP 07/27/21 0948 (!) 100/54     Pulse Rate 07/27/21 0948 64     Resp 07/27/21 0948 18     Temp 07/27/21 0948 97.9 F (36.6 C)  Temp Source 07/27/21 0948 Oral     SpO2 07/27/21 0948 97 %     Weight 07/27/21 0951 175 lb (79.4 kg)     Height 07/27/21 0951 5\' 5"  (1.651 m)     Head Circumference --      Peak Flow --      Pain Score 07/27/21 0951 2     Pain Loc --      Pain Edu? --      Excl. in [place]? --      Physical Exam Vitals and nursing note reviewed.  Constitutional:      General: She is not in acute distress.    Appearance: She is well-developed.  HENT:     Head: Normocephalic and atraumatic.     Comments: NCAT, atraumatic, no hemotympanum bilaterally Eyes:     Conjunctiva/sclera: Conjunctivae normal.  Neck:     Comments: No midline or paraspinal TTP Cardiovascular:     Rate and Rhythm: Normal rate and regular rhythm.     Heart sounds: Normal heart sounds. No murmur heard.   No friction rub.  Pulmonary:     Effort: Pulmonary effort is normal. No respiratory distress.     Breath sounds: Normal breath sounds. No wheezing or rales.  Abdominal:     General: There is no distension.     Palpations: Abdomen is soft.     Tenderness: There is no abdominal tenderness.  Musculoskeletal:     Cervical back: Neck supple.  Skin:    General: Skin is warm.     Capillary Refill: Capillary refill takes  less than 2 seconds.  Neurological:     Mental Status: She is alert and oriented to person, place, and time.     Motor: No abnormal muscle tone.      ____________________________________________   LABS (all labs ordered are listed, but only abnormal results are displayed)  Labs Reviewed  BASIC METABOLIC PANEL - Abnormal; Notable for the following components:      Result Value   Potassium 3.4 (*)    Glucose, Bld 114 (*)    BUN 34 (*)    Creatinine, Ser 2.13 (*)    GFR, Estimated 24 (*)    All other components within normal limits  CBC - Abnormal; Notable for the following components:   RBC 3.58 (*)    Hemoglobin 11.3 (*)    HCT 33.2 (*)    All other components within normal limits  LACTIC ACID, PLASMA  URINALYSIS, COMPLETE (UACMP) WITH MICROSCOPIC  LACTIC ACID, PLASMA  TROPONIN I (HIGH SENSITIVITY)  TROPONIN I (HIGH SENSITIVITY)    ____________________________________________  EKG: Normal sinus rhythm, VR 65. PR 150, QRS 72, QTc 522. No No acute ST elevations or depressions. No ischemia or infarct. ________________________________________  RADIOLOGY All imaging, including plain films, CT scans, and ultrasounds, independently reviewed by me, and interpretations confirmed via formal radiology reads.  ED MD interpretation:   CT Head: NAICA CXR: Clear  Official radiology report(s): DG Chest 2 View  Result Date: 07/27/2021 CLINICAL DATA:  Shortness of breath EXAM: CHEST - 2 VIEW COMPARISON:  04/07/2021 FINDINGS: Heart and mediastinal contours are within normal limits. No focal opacities or effusions. No acute bony abnormality. IMPRESSION: No active cardiopulmonary disease. Electronically Signed   By: Rolm Baptise M.D.   On: 07/27/2021 13:00   CT HEAD WO CONTRAST  Result Date: 07/27/2021 CLINICAL DATA:  Fall with head injury EXAM: CT HEAD WITHOUT CONTRAST TECHNIQUE: Contiguous axial images were obtained from the  base of the skull through the vertex without intravenous  contrast. COMPARISON:  03/28/2019 FINDINGS: Brain: No evidence of acute infarction, hemorrhage, hydrocephalus, extra-axial collection or mass lesion/mass effect. Vascular: No hyperdense vessel or unexpected calcification. Skull: Normal. Negative for fracture or focal lesion. Left parietal scalp contusion Sinuses/Orbits: Bilateral cataract resection. Minimally covered right maxillary sinus with fluid level IMPRESSION: 1. No evidence of intracranial injury. 2. Left posterior scalp contusion without fracture. 3. Right maxillary sinus fluid level. Electronically Signed   By: Jorje Guild M.D.   On: 07/27/2021 10:53    ____________________________________________  PROCEDURES   Procedure(s) performed (including Critical Care):  Procedures  ____________________________________________  INITIAL IMPRESSION / MDM / [place] / ED COURSE  As part of my medical decision making, I reviewed the following data within the [place] notes reviewed and incorporated, Old chart reviewed, Notes from prior ED visits, and [place] Controlled Substance Database       *WANDRA BABIN was evaluated in Emergency Department on 07/27/2021 for the symptoms described in the history of present illness. She was evaluated in the context of the global COVID-19 pandemic, which necessitated consideration that the patient might be at risk for infection with the SARS-CoV-2 virus that causes COVID-19. Institutional protocols and algorithms that pertain to the evaluation of patients at risk for COVID-19 are in a state of rapid change based on information released by regulatory bodies including the CDC and federal and state organizations. These policies and algorithms were followed during the patient's care in the ED.  Some ED evaluations and interventions may be delayed as a result of limited staffing during the pandemic.*     Medical Decision Making:  75 yo F here with fall, generalized weakness.  Re: fall, history suggests primarily mechanical fall though it did occur as she stood up from driving to enter a building, and pt was hypotensive on EMS arrival which resolves with fluids, raising question of orthostasis. Pt feels markedly improved with IVF and BP is stable in ED. BMP shows mild elevation in BUN compared to baseline c/w dehydration. Hgb at baseline. CBC with normal WBC. LA normal. EKG nonischemic, QT is prolonged but this has been baseline for years per review of records, and has not acutely changed or prolonged.  Suspect mechanical fall, with possible component of mild weakness from orthostasis. IVF given. CT head reviewed and unremarkable, no signs of traumatic injury. Pt does endorse some mild increase in nighttime urinary urgency, so will check UA, tx accordingly, and plan to d/c with outpt follow-up.   ____________________________________________  FINAL CLINICAL IMPRESSION(S) / ED DIAGNOSES  Final diagnoses:  Fall, initial encounter  Dehydration     MEDICATIONS GIVEN DURING THIS VISIT:  Medications  sodium chloride 0.9 % bolus 500 mL (0 mLs Intravenous Stopped 07/27/21 1240)  sodium chloride 0.9 % bolus 500 mL (500 mLs Intravenous New Bag/Given 07/27/21 1320)     ED Discharge Orders     None        Note:  This document was prepared using Dragon voice recognition software and may include unintentional dictation errors.   Duffy Bruce, MD 07/27/21 1725

## 2021-07-27 NOTE — ED Provider Notes (Signed)
Patient's labs are okay.  His troponin is slightly high but it did not rise significantly after the initial blood draw.  Patient herself said she thinks she might have passed out and woke up when she hit her head on the ground.  She said she had gotten out of the car and put her purse on the hood of the car to put her mask on and is not quite sure what happened but then again woke up when her head hit the ground.  Tests are otherwise normal except for somewhat of an increase in her GFR possibly due to dehydration.  I discussed with the patient whether she wanted to go home or spend the night in the hospital.  Personally we will do question of syncope at her age and rather watch her.  Patient actually said she would rather be in the hospital overnight as well.  We can hydrate her more that way and be sure that GFR is normalizing and watch her for arrhythmias.   Nena Polio, MD 07/27/21 1919

## 2021-07-27 NOTE — H&P (Signed)
History and Physical    PLEASE NOTE THAT DRAGON DICTATION SOFTWARE WAS USED IN THE CONSTRUCTION OF THIS NOTE.   Elaine Norris IOX:[id number] DOB: 1946/07/21 DOA: 07/27/2021  PCP: Elaine Maltese, MD Patient coming from: home   I have personally briefly reviewed patient's old medical records in [place]  Chief Complaint: loss of consciousness  HPI: Elaine Norris is a 75 y.o. female with medical history significant for hypertension, hyperlipidemia, obstructive sleep apnea on nocturnal CPAP, who is admitted to [facility] on 07/27/2021 with syncope after presenting from home to [facility] ED for further evaluation of episode of loss of consciousness.  The patient reports that she experienced an episode of loss of consciousness earlier this morning as she was from a seated to a standing position while getting out of her car in the parking lot her PCPs office, where she was to attend a routine outpatient follow-up appointment.  She denies any preceding sensation of dizziness, lightheadedness, diaphoresis any other subjective indication of ensuing loss of consciousness.  However, she awoke on the ground of the parking lot, with a good Samaritan who had witnessed the above event conveying to her that she had lost consciousness and subsequently hit the left part of her head on the asphalt of the parking lot as well as hitting her left elbow on the ground as well.  Its parent reportedly conveyed to the patient that she was unconscious only for a few seconds before regaining consciousness.  EMS was contacted from the parking, and the patient was brought to [facility] ED for further evaluation of this episode of loss of consciousness.  This episode was not associated with any generalized tonic-clonic activity, nor associate with any tongue biting or loss of bowel/bladder function.  The patient denies any associated acute focal weakness, acute focal numbness, paresthesias, facial droop,  slurred speech, expressive aphasia, acute change in vision, dysphagia, vertigo.  Denies any associated or ensuing headache, or neck pain.  The patient denies any recent, immediately preceding, or ensuing chest pain, shortness of breath, palpitations, diaphoresis, nausea, vomiting dizziness.  Denies any prior history of syncopal event.  The patient conveys to me her sensation of dehydration over the last 3 to 4 days, noting a dry mouth over that timeframe in the setting of a slight decline in her oral intake of water over the preceding week.  She denies any associated recent nausea, vomiting, diarrhea.   Denies any recent subjective fever, chills, rigors, or generalized myalgias.  No recent neck stiffness, rhinitis, rhinorrhea, sore throat, wheezing, cough, abdominal pain, or rash.  No recent traveling or known COVID-19 exposures.  Is any recent peripheral edema, calf tenderness, or new lower extremity erythema.  Denies any recent hemoptysis.  Not associate with any dysuria, gross hematuria, or change in urinary urgency/frequency.  Medical history notable for essential hypertension, for which the patient is on hydralazine, Imdur, labetalol, losartan, and scheduled Lasix.  She also has a documented history of obstructive sleep apnea, reporting good compliance with home nocturnal CPAP.  Most recent echocardiogram appears to have occurred in January 2014, and demonstrated normal left ventricular cavity size, normal left ventricular wall thickness, LVEF 55 to 60%, no evidence of focal motion abnormalities, normal diastolic parameters, and mild mitral regurgitation.     ED Course:  Vital signs in the ED were notable for the following: Afebrile; heart rate 56-64; initial blood pressure 100/54, which improved to 155/62 following IV fluid bolus, as quantified below, with most  recent blood pressure noted to be 169/71; respiratory rate 16-18, oxygen saturation 97 to 99% on room air.  Labs were notable for the  following: BMP was notable for the following: Potassium 3.4, bicarbonate 29, BUN 34, creatinine 2.3 relative to 1.93 on 04/07/2021 as well as 1.63 on 07/22/2019, glucose 114.  High-sensitivity troponin I x2 were initially found to be 16, with repeat value trending down to 12.  CBC notable for white cell count 7800, hemoglobin 11.3, relative to most recent prior value of 12.3 on 04/07/2021.  Urinalysis demonstrated no white blood cells, no bacteria, was nitrate negative as well as leukocyte Estrace negative, showing no red blood cells nor any protein, but was positive for hyaline casts.  Screening COVID-19/influenza PCR was performed in the ED this evening about to be positive.  Imaging and additional notable ED work-up: EKG showed sinus rhythm with heart rate 65, QTc 522 ms, and no evidence of T wave or ST changes, including no evidence of ST elevation.  Chest x-ray showed no evidence of acute cardiopulmonary process, including no evidence of infiltrate, edema, effusion, or pneumothorax.  Noncontrast CT that showed no evidence of acute intracranial process, including no evidence of intracranial hemorrhage, while showing left posterior scalp contusion without evidence of skull fracture.  While in the ED, the following were administered: Normal saline 500 cc bolus x2.  Zosyn, the patient was admitted for overnight observation for further evaluation and management of presenting syncope.     Review of Systems: As per HPI otherwise 10 point review of systems negative.   Past Medical History:  Diagnosis Date   Anxiety    Related to surgery   Arthritis    hands   Cancer ([place]) 1970's   cervical   Depression    Controlled   Esophagitis    GERD (gastroesophageal reflux disease)    Gout    Hyperlipidemia    Hypertension    Kidney carcinoma ([place]) 06/2020   Myocardial infarction ([place])    Osteoporosis    feet   Pseudoseizure    Sleep apnea    CPAP    Past Surgical History:  Procedure Laterality  Date   CARDIAC CATHETERIZATION  05/2011   [facility]   CARDIAC [facility]IZATION  09/2011   [facility]   CARDIAC CATHETERIZATION  2012   [facility]   CATARACT EXTRACTION W/PHACO Left 02/17/2018   Procedure: CATARACT EXTRACTION PHACO AND INTRAOCULAR LENS PLACEMENT ([place]) LEFT;  Surgeon: Leandrew Koyanagi, MD;  Location: [place];  Service: Ophthalmology;  Laterality: Left;   CATARACT EXTRACTION W/PHACO Right 03/10/2018   Procedure: CATARACT EXTRACTION PHACO AND INTRAOCULAR LENS PLACEMENT ([place]) RIGHT  COMPLICATED;  Surgeon: Leandrew Koyanagi, MD;  Location: [place];  Service: Ophthalmology;  Laterality: Right;  NEEDS EARLY AM 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 [place]   CHOLECYSTECTOMY N/A 06/26/2018   Procedure: LAPAROSCOPIC CHOLECYSTECTOMY;  Surgeon: Kieth Brightly Arta Bruce, MD;  Location: [facility];  Service: General;  Laterality: N/A;   COLONOSCOPY     COLONOSCOPY WITH PROPOFOL N/A 01/21/2020   Procedure: COLONOSCOPY WITH PROPOFOL;  Surgeon: Toledo, Benay Pike, MD;  Location: [facility];  Service: Gastroenterology;  Laterality: N/A;   ESOPHAGOGASTRODUODENOSCOPY (EGD) WITH PROPOFOL N/A 01/21/2020   Procedure: ESOPHAGOGASTRODUODENOSCOPY (EGD) WITH PROPOFOL;  Surgeon: Toledo, Benay Pike, MD;  Location: [facility];  Service: Gastroenterology;  Laterality: N/A;   MULTIPLE TOOTH EXTRACTIONS     TONSILLECTOMY     WISDOM TOOTH EXTRACTION      Social History:  reports that she is  a non-smoker but has been exposed to tobacco smoke. She has never used smokeless tobacco. She reports that she does not drink alcohol and does not use drugs.   Allergies  Allergen Reactions   Fire Ant Anaphylaxis   Gabapentin     Muscles jerks, weakness and difficulty swallowing - Noted in hospital stay   Albuterol Other (See Comments)    Pt reports seizures    Codeine Other (See Comments)    Altered mental status     Family History  Problem Relation Age of Onset   Heart disease Mother    Heart failure  Father    Breast cancer Neg Hx     Family history reviewed and not pertinent    Prior to Admission medications   Medication Sig Start Date End Date Taking? Authorizing Provider  acetaminophen (TYLENOL) 500 MG tablet Take 500-1,000 mg by mouth every 6 (six) hours as needed for mild pain or fever.     [provider]  alendronate (FOSAMAX) 70 MG tablet Take 70 mg by mouth every Saturday.  01/03/17   [provider]  allopurinol (ZYLOPRIM) 100 MG tablet Take 100 mg by mouth at bedtime.  01/05/17   [provider]  aspirin 81 MG tablet Take 81 mg by mouth at bedtime.     [provider]  atorvastatin (LIPITOR) 40 MG tablet Take 40 mg by mouth at bedtime.  12/15/16   [provider]  baclofen (LIORESAL) 10 MG tablet Take 10 mg by mouth 2 (two) times daily.     [provider]  calcium-vitamin D 250-100 MG-UNIT tablet Take 1 tablet by mouth 2 (two) times daily.    [provider]  citalopram (CELEXA) 20 MG tablet Take 20 mg by mouth daily. 06/30/21   [provider]  EPINEPHrine (EPIPEN 2-PAK) 0.3 mg/0.3 mL IJ SOAJ injection Inject 0.3 mg into the muscle as needed for anaphylaxis.    [provider]  furosemide (LASIX) 40 MG tablet Take 40 mg by mouth daily. 05/29/21   [provider]  hydrALAZINE (APRESOLINE) 100 MG tablet Take 100 mg by mouth 2 (two) times daily.    [provider]  isosorbide mononitrate (IMDUR) 30 MG 24 hr tablet Take 30 mg by mouth daily. 05/26/21   [provider]  labetalol (NORMODYNE) 100 MG tablet Take 100 mg by mouth 2 (two) times daily. 05/16/21   [provider]  labetalol (NORMODYNE) 200 MG tablet Take 200 mg by mouth 2 (two) times daily. 03/13/18   [provider]  loperamide (IMODIUM A-D) 2 MG tablet Take 1 tablet (2 mg total) by mouth 4 (four) times daily as needed for diarrhea or loose stools. 04/07/21   Duffy Bruce, MD  losartan (COZAAR) 100 MG  tablet Take 100 mg by mouth daily. 07/02/21   [provider]  losartan (COZAAR) 25 MG tablet Take 12.5 mg by mouth daily.    [provider]  meclizine (ANTIVERT) 12.5 MG tablet Take 1 tablet (12.5 mg total) by mouth 3 (three) times daily as needed for dizziness. 03/29/19   Gregor Hams, MD  Multiple Vitamin (MULTIVITAMIN WITH MINERALS) TABS tablet Take 1 tablet by mouth daily.    [provider]  nitroGLYCERIN (NITROSTAT) 0.4 MG SL tablet Place 0.4 mg under the tongue every 5 (five) minutes as needed for chest pain.    [provider]  ondansetron (ZOFRAN ODT) 4 MG disintegrating tablet Take 1 tablet (4 mg total) by mouth  every 8 (eight) hours as needed for nausea or vomiting. 04/07/21   Duffy Bruce, MD  pantoprazole (PROTONIX) 40 MG tablet Take 40 mg by mouth daily.    [provider]     Objective    Physical Exam: Vitals:   07/27/21 1157 07/27/21 1814 07/27/21 1816 07/27/21 1935  BP: (!) 155/62  (!) 169/71 (!) 187/60  Pulse: (!) 56  60 60  Resp: 18 16 16 16   Temp:  98.8 F (37.1 C) 98.8 F (37.1 C)   TempSrc:  Oral Oral   SpO2: 99% 99% 99% 97%  Weight:      Height:        General: appears to be stated age; alert, oriented Skin: warm, dry, no rash Head:  left-sided scalp contusion noted; otherwise, AT/[place] Mouth:  Oral mucosa membranes appear dry, normal dentition Neck: supple; trachea midline Heart:  RRR; did not appreciate any M/R/G Lungs: CTAB, did not appreciate any wheezes, rales, or rhonchi Abdomen: + BS; soft, ND, NT Vascular: 2+ pedal pulses b/l; 2+ radial pulses b/l Extremities: no peripheral edema, no muscle wasting Neuro: strength and sensation intact in upper and lower extremities b/l    Labs on Admission: I have personally reviewed following labs and imaging studies  CBC: Recent Labs  Lab 07/27/21 0855  WBC 7.8  HGB 11.3*  HCT 33.2*  MCV 92.7  PLT 356   Basic Metabolic Panel: Recent Labs  Lab  07/27/21 0855  NA 138  K 3.4*  CL 98  CO2 29  GLUCOSE 114*  BUN 34*  CREATININE 2.13*  CALCIUM 9.2   GFR: Estimated Creatinine Clearance: 23.8 mL/min (A) (by C-G formula based on SCr of 2.13 mg/dL (H)). Liver Function Tests: No results for input(s): AST, ALT, ALKPHOS, BILITOT, PROT, ALBUMIN in the last 168 hours. No results for input(s): LIPASE, AMYLASE in the last 168 hours. No results for input(s): AMMONIA in the last 168 hours. Coagulation Profile: No results for input(s): INR, PROTIME in the last 168 hours. Cardiac Enzymes: No results for input(s): CKTOTAL, CKMB, CKMBINDEX, TROPONINI in the last 168 hours. BNP (last 3 results) No results for input(s): PROBNP in the last 8760 hours. HbA1C: No results for input(s): HGBA1C in the last 72 hours. CBG: No results for input(s): GLUCAP in the last 168 hours. Lipid Profile: No results for input(s): CHOL, HDL, LDLCALC, TRIG, CHOLHDL, LDLDIRECT in the last 72 hours. Thyroid Function Tests: No results for input(s): TSH, T4TOTAL, FREET4, T3FREE, THYROIDAB in the last 72 hours. Anemia Panel: No results for input(s): VITAMINB12, FOLATE, FERRITIN, TIBC, IRON, RETICCTPCT in the last 72 hours. Urine analysis:    Component Value Date/Time   COLORURINE YELLOW (A) 07/27/2021 1827   APPEARANCEUR HAZY (A) 07/27/2021 1827   LABSPEC 1.014 07/27/2021 1827   PHURINE 5.0 07/27/2021 1827   GLUCOSEU NEGATIVE 07/27/2021 1827   HGBUR NEGATIVE 07/27/2021 1827   BILIRUBINUR NEGATIVE 07/27/2021 1827   KETONESUR NEGATIVE 07/27/2021 1827   PROTEINUR NEGATIVE 07/27/2021 1827   NITRITE NEGATIVE 07/27/2021 1827   LEUKOCYTESUR NEGATIVE 07/27/2021 1827    Radiological Exams on Admission: DG Chest 2 View  Result Date: 07/27/2021 CLINICAL DATA:  Shortness of breath EXAM: CHEST - 2 VIEW COMPARISON:  04/07/2021 FINDINGS: Heart and mediastinal contours are within normal limits. No focal opacities or effusions. No acute bony abnormality. IMPRESSION: No  active cardiopulmonary disease. Electronically Signed   By: Rolm Baptise M.D.   On: 07/27/2021 13:00   CT HEAD WO CONTRAST  Result Date:  07/27/2021 CLINICAL DATA:  Fall with head injury EXAM: CT HEAD WITHOUT CONTRAST TECHNIQUE: Contiguous axial images were obtained from the base of the skull through the vertex without intravenous contrast. COMPARISON:  03/28/2019 FINDINGS: Brain: No evidence of acute infarction, hemorrhage, hydrocephalus, extra-axial collection or mass lesion/mass effect. Vascular: No hyperdense vessel or unexpected calcification. Skull: Normal. Negative for fracture or focal lesion. Left parietal scalp contusion Sinuses/Orbits: Bilateral cataract resection. Minimally covered right maxillary sinus with fluid level IMPRESSION: 1. No evidence of intracranial injury. 2. Left posterior scalp contusion without fracture. 3. Right maxillary sinus fluid level. Electronically Signed   By: Jorje Guild M.D.   On: 07/27/2021 10:53     EKG: Independently reviewed, with result as described above.    Assessment/Plan   KLARYSSA FAUTH is a 75 y.o. female with medical history significant for hypertension, hyperlipidemia, obstructive sleep apnea on nocturnal CPAP, who is admitted to [facility] on 07/27/2021 with syncope after presenting from home to [facility] ED for further evaluation of episode of loss of consciousness.   Principal Problem:   Syncope Active Problems:   Hypertension   Hyperlipidemia   Blood creatinine increased compared with prior measurement   Fall at home, initial encounter   Hypokalemia   Prolonged QT interval   GERD (gastroesophageal reflux disease)   Sleep apnea     #) Syncope: 1 episode of syncope occurring on the morning of 07/27/2021 as the patient was rising from a seated to a seated position and getting out of her car after a 10 to 15-minute car ride, but in the absence of any overt prodrome, with resultant fall the ground, as witnessed by  a good Samaritan, who confirmed duration of loss of consciousness to be only a few seconds before patient regained consciousness in the absence of any associated evidence of postictal state.  Per this history including additional discussions that I had with the patient, it appears that the patient lost consciousness before hitting her head on the ground, and that the fall was as a consequence of her syncopal event.   In the setting of syncopal event occurring while moving from a seated to standing position, differential includes orthostatic hypotension in the setting of dehydration given the patient's report of recent decline in oral take of water, as well as physical exam and laboratory evidence to suggest dehydration, including the presence of dry oral mucous membranes.  Potential orthostatic hypotensive fluids may have been further exacerbated by home pharmacologic factors serving to diminish compensatory tachycardia/vasoconstrictive response in the setting of outpatient labetalol, hydralazine, Imdur, losartan, with further dehydration and fluid stemming from Lasix, as above.  However, the absence of overt prodrome is less typical for orthostatic hypotension, associated differential also including ventricular arrhythmia.  Of note, presenting EKG, while demonstrating no evidence of acute ischemic changes, does show QTC prolongation, increasing risk for ventricular arrhythmia in the form of torsades de point.  At this time, presentation appears less consistent with ACS, with serial troponin to be nonelevated and trending down, with final troponin value remaining nonelevated after being checked 12 hours following syncopal event, with presenting EKG showing no evidence of acute ischemic changes, with presentation not associated with any recent chest discomfort.  That being said, in the setting of syncope without prodrome, it was chart review revealing mildly elevated troponin of 22 on 04/07/2021, will further  evaluate with echocardiogram in the morning, including for evaluation of interval development of focal wall motion abnormalities, relative to most recently  identified echocardiogram occurring in January 2014. Given that the syncopal event also appears to have occurred with potential rotational component of the neck, will also check bilateral carotid ultrasound. The patient is noted to be borderline bradycardic, with heart rates in the low 60s in the context of home labetalol.  Per chart review, it appears that the patient was experiencing heart rates in the 50s for the last few months while asymptomatic during that period of time, suggesting against symptomatic bradycardia as a primary influence leading to today's syncopal presentation.  Presentation associated with any acute focal neurologic deficits.  Clinically, acute ischemic stroke versus seizures appear to be less likely at this time, and presenting CT head shows no evidence of acute intracranial process, including no evidence of intracranial hemorrhage. Will check orthostatic vital signs, but with the caveat that the patient has already received IVF's in the ED, potentially altering the results of this evaluation.      Plan: I have placed a nursing communication order requesting that orthostatic vital signs x 1 set be checked and documented, following which will initiate gentle IV fluids in the form of lactated Ringer's at 50 cc/h x 12 hours.. Monitor on telemetry. Will resume home hydralazine, but otherwise Will Hold home BB and other outpatient antihypertensive medications overnight.   Monitor strict I's and O's.  Add on serum magnesium level, particularly in setting of QTC prolongation, as above.  Check CMP, CBC, fall precautions ordered.  Echocardiogram and bilateral carotid ultrasound ordered for the morning, as above.  Repeat EKG in the morning for further monitoring of QTC prolongation.        #) Elevated creatinine: Presenting creatinine  found to be 2.13 representing slight increase relative to most recent prior value of 1.93 on 04/07/2021, but not meeting threshold of interval increase by 0.3 in order to be criteria for acute kidney injury.  Regardless, suspect prerenal contribution to interval increase in serum creatinine as a consequence of dehydration in the setting of the patient's report of 1 week of decline in intake of water as further detailed above.  Presenting urinalysis notable for the presence of hyaline casts, which are consistent with a suspected process, while demonstrating no evidence of additional urinary casts.  will provide gentle IV fluids overnight while closely monitoring some literature and also also holding home antihypertensive medications, including losartan.  Findings not suggestive of myoglobinuria to warrant pursuit of CPK level at this time.  Plan: Monitor strict I's and O's and daily weights.  Tempt avoid nephrotoxic agents.  Hold home losartan for now.  Add on random urine sodium as well as random urine creatinine.  Repeat BMP in the morning.       #) Ground-level fall: Patient experienced a ground-level fall as a consequence of a syncopal episode experienced earlier this morning, as further detailed above, resulting in patient hitting her head pavement.  She is noted to be on a daily baby aspirin as her sole outpatient blood thinning agent, although ensuing CTA shows no evidence of acute intracranial process, including no evidence of intracranial hemorrhage or skull fracture.  Presentation not associate with any acute focal neurologic deficits.   Plan: Further evaluation and treatment of presenting syncope, as above.  Fall precautions.  Hold home baclofen for now.  Add on MMA level.      #) Hypokalemia: Presenting serum potassium mildly low at 3.4.  Diet gentle potassium supplementation particularly in the setting of syncope without prodrome, with differential including ventricular arrhythmia. While  presentation appears to warrant some degree of potassium supplementation in order to decrease risk for ventricular arrhythmia as a consequence of hypokalemia, will proceed with gentle potassium supplementation in order to reduce risk for ensuing development of hyper kalemia given concomitant interval elevation of creatinine without overt criteria met for AKI, as further quantified and described above.  Plan: Potassium chloride 20 mEq p.o. x1 dose now.  Continuous lactated Ringer's overnight, as further described above.  Add on serum magnesium level.  Repeat BMP and serum magnesium levels in the morning.  Monitor on telemetry.      #) QTC prolongation: Presenting EKG reflects QTC of 522 ms.  Per review of home medications, it appears that around the outpatient medications that may be an overt contributor to QTC prolongation is her home Celexa.  Warrants further evaluation in the context of syncope without prodrome, as further detailed above.  Plan: Add on serum magnesium level, with plan for as needed IV magnesium sulfate supplementation order to maintain serum magnesium level of greater than or equal to 2.0.  Repeat serum magnesium level in the morning.  Monitor on telemetry.  Hold home Celexa.  Repeat EKG in the morning to evaluate interval trend QTC.      #) depression: On Celexa as an outpatient.  In the setting of presenting QTC prolongation with syncope in the absence of prodrome, will hold home Celexa for now, pursuing further evaluation and management of presenting QTC prolongation, as above.  Plan: Hold home Celexa.  Further evaluation management of QTC prolongation, as above, including interval monitoring on telemetry with repeat EKG ordered for the morning.       #) GERD: On Protonix as an outpatient.  Plan: Continue PPI.       #) Hyperlipidemia: On high intensity atorvastatin as an outpatient.  Plan: Continue home statin.      #) Obstructive sleep apnea: Documented  history of such, with patient reporting good compliance with home nocturnal CPAP.  Plan: At this visit in order with respiratory therapy for provision of nocturnal CPAP for use during this hospitalization.      DVT prophylaxis: SCDs Code Status: Full code Family Communication: none Disposition Plan: Per Rounding Team Consults called: none;  Admission status: Observation     Of note, this patient was added by me to the following Admit List/Treatment Team: armcadmits.    Of note, the Adult Admission Order Set (Multimorbid order set) was used by me in the admission process for this patient.   PLEASE NOTE THAT DRAGON DICTATION SOFTWARE WAS USED IN THE CONSTRUCTION OF THIS NOTE.   [facility] Pager [phone] From [place]  Otherwise, please contact night-coverage  [email or web address] Password [facility]   07/27/2021, 7:49 PM

## 2021-07-27 NOTE — ED Notes (Signed)
Pt taken for xray

## 2021-07-27 NOTE — Progress Notes (Signed)
 ?  While rounding in ED I encountered this patient who had fallen in parking lot of doctor's office. She was frustrated with the wait and feeling uncomfortable. Her son was eventually allowed to come in which resulted in her relaxing a bit more in my presence. Provided presence, support and prayer to patient as requested.

## 2021-07-27 NOTE — ED Triage Notes (Signed)
Pt to ED for fall, states unsure what made her fall, thinks she is dehydrated. Reports hitting her head. Denies loc or blood thinner use.  Reports hx HTN, took meds this am

## 2021-07-28 ENCOUNTER — Observation Stay
Admit: 2021-07-28 | Discharge: 2021-07-28 | Disposition: A | Discharge status: Home / Self Care | Payer: Medicare Other | Attending: Internal Medicine | Admitting: Internal Medicine

## 2021-07-28 ENCOUNTER — Observation Stay: Payer: Medicare Other

## 2021-07-28 DIAGNOSIS — K219 Gastro-esophageal reflux disease without esophagitis: Secondary | ICD-10-CM | POA: Diagnosis not present

## 2021-07-28 DIAGNOSIS — I10 Essential (primary) hypertension: Secondary | ICD-10-CM | POA: Diagnosis not present

## 2021-07-28 DIAGNOSIS — G4733 Obstructive sleep apnea (adult) (pediatric): Secondary | ICD-10-CM | POA: Diagnosis not present

## 2021-07-28 DIAGNOSIS — R55 Syncope and collapse: Secondary | ICD-10-CM | POA: Diagnosis not present

## 2021-07-28 DIAGNOSIS — N184 Chronic kidney disease, stage 4 (severe): Secondary | ICD-10-CM | POA: Diagnosis not present

## 2021-07-28 DIAGNOSIS — R54 Age-related physical debility: Secondary | ICD-10-CM | POA: Diagnosis not present

## 2021-07-28 DIAGNOSIS — W19XXXA Unspecified fall, initial encounter: Secondary | ICD-10-CM | POA: Diagnosis not present

## 2021-07-28 DIAGNOSIS — R9431 Abnormal electrocardiogram [ECG] [EKG]: Secondary | ICD-10-CM | POA: Diagnosis not present

## 2021-07-28 DIAGNOSIS — I6523 Occlusion and stenosis of bilateral carotid arteries: Secondary | ICD-10-CM | POA: Diagnosis not present

## 2021-07-28 LAB — COMPREHENSIVE METABOLIC PANEL
ALT: 20 U/L (ref 0–44)
AST: 24 U/L (ref 15–41)
Albumin: 3.8 g/dL (ref 3.5–5.0)
Alkaline Phosphatase: 65 U/L (ref 38–126)
Anion gap: 6 (ref 5–15)
BUN: 31 mg/dL — ABNORMAL HIGH (ref 8–23)
CO2: 29 mmol/L (ref 22–32)
Calcium: 8.8 mg/dL — ABNORMAL LOW (ref 8.9–10.3)
Chloride: 103 mmol/L (ref 98–111)
Creatinine, Ser: 1.71 mg/dL — ABNORMAL HIGH (ref 0.44–1.00)
GFR, Estimated: 31 mL/min — ABNORMAL LOW (ref >60)
Glucose, Bld: 94 mg/dL (ref 70–99)
Potassium: 3.4 mmol/L — ABNORMAL LOW (ref 3.5–5.1)
Sodium: 138 mmol/L (ref 135–145)
Total Bilirubin: 0.9 mg/dL (ref 0.3–1.2)
Total Protein: 6.5 g/dL (ref 6.5–8.1)

## 2021-07-28 LAB — CBC
HCT: 32.6 % — ABNORMAL LOW (ref 36.0–46.0)
Hemoglobin: 10.9 g/dL — ABNORMAL LOW (ref 12.0–15.0)
MCH: 31.3 pg (ref 26.0–34.0)
MCHC: 33.4 g/dL (ref 30.0–36.0)
MCV: 93.7 fL (ref 80.0–100.0)
Platelets: 153 10*3/uL (ref 150–400)
RBC: 3.48 MIL/uL — ABNORMAL LOW (ref 3.87–5.11)
RDW: 12.9 % (ref 11.5–15.5)
WBC: 7.5 10*3/uL (ref 4.0–10.5)
nRBC: 0 % (ref 0.0–0.2)

## 2021-07-28 LAB — MAGNESIUM: Magnesium: 2.1 mg/dL (ref 1.7–2.4)

## 2021-07-28 MED ORDER — INFLUENZA VAC A&B SA ADJ QUAD 0.5 ML IM PRSY
0.5000 mL | PREFILLED_SYRINGE | INTRAMUSCULAR | Status: AC
  Administered 2021-07-29: 0.5 mL via INTRAMUSCULAR
  Filled 2021-07-28 (×3): qty 0.5

## 2021-07-28 MED ORDER — SODIUM CHLORIDE 0.9% FLUSH
10.0000 mL | Freq: Two times a day (BID) | INTRAVENOUS | Status: DC
  Administered 2021-07-28 – 2021-07-29 (×2): 10 mL via INTRAVENOUS

## 2021-07-28 MED ORDER — BACLOFEN 10 MG PO TABS
5.0000 mg | ORAL_TABLET | Freq: Two times a day (BID) | ORAL | Status: DC
  Administered 2021-07-28 – 2021-07-29 (×3): 5 mg via ORAL
  Filled 2021-07-28 (×4): qty 0.5

## 2021-07-28 MED ORDER — CALCIUM CARBONATE-VITAMIN D 500-200 MG-UNIT PO TABS
0.5000 | ORAL_TABLET | Freq: Two times a day (BID) | ORAL | Status: DC
  Administered 2021-07-28 – 2021-07-29 (×3): 0.5 via ORAL
  Filled 2021-07-28 (×3): qty 1

## 2021-07-28 MED ORDER — ALLOPURINOL 100 MG PO TABS
100.0000 mg | ORAL_TABLET | Freq: Every day | ORAL | Status: DC
  Administered 2021-07-28: 100 mg via ORAL
  Filled 2021-07-28 (×2): qty 1

## 2021-07-28 MED ORDER — LABETALOL HCL 100 MG PO TABS
100.0000 mg | ORAL_TABLET | Freq: Two times a day (BID) | ORAL | Status: DC
  Administered 2021-07-28 – 2021-07-29 (×3): 100 mg via ORAL
  Filled 2021-07-28 (×4): qty 1

## 2021-07-28 MED ORDER — ADULT MULTIVITAMIN W/MINERALS CH
1.0000 | ORAL_TABLET | Freq: Every day | ORAL | Status: DC
  Administered 2021-07-28 – 2021-07-29 (×2): 1 via ORAL
  Filled 2021-07-28 (×2): qty 1

## 2021-07-28 MED ORDER — POTASSIUM CHLORIDE CRYS ER 20 MEQ PO TBCR
40.0000 meq | EXTENDED_RELEASE_TABLET | ORAL | Status: AC
Start: 2021-07-28 — End: 2021-07-28
  Administered 2021-07-28 (×2): 40 meq via ORAL
  Filled 2021-07-28 (×2): qty 4

## 2021-07-28 MED ORDER — CITALOPRAM HYDROBROMIDE 20 MG PO TABS
20.0000 mg | ORAL_TABLET | Freq: Every day | ORAL | Status: DC
  Administered 2021-07-28 – 2021-07-29 (×2): 20 mg via ORAL
  Filled 2021-07-28 (×2): qty 1

## 2021-07-28 MED ORDER — HYDRALAZINE HCL 20 MG/ML IJ SOLN: 10.0000 mg | Freq: Once | INTRAMUSCULAR | Status: DC

## 2021-07-28 MED ORDER — HYDRALAZINE HCL 20 MG/ML IJ SOLN
20.0000 mg | Freq: Once | INTRAMUSCULAR | Status: AC
  Administered 2021-07-28: 20 mg via INTRAVENOUS
  Filled 2021-07-28: qty 1

## 2021-07-28 NOTE — Evaluation (Signed)
Physical Therapy Evaluation Patient Details Name: Elaine Norris MRN: [id number] DOB: 10-13-1946 Today's Date: 07/28/2021  History of Present Illness  Pt is a 75 y.o. F w/ PMH of HTN, OSA and admitted for syncope following episode for LOC.  Clinical Impression  Pt alert, in bed and oriented x4. Pt indicates PLOF as independent for ADLs and ambulates the majority of time without an AD, occasionally utilizes a quad cane or standard walker when feeling unstable which has been more pronounces over the last year. Pt is MOD I for bed mobility but heavily utilizes bed rails to mobilize trunk. Transfers independently without use of AD or contact for support. Ambulates with MOD I for increased time. Trialed a RW for stability and without. Improved overall cadence with RW but no LOB noted without AD. Pt education provided to utilize RW when feeling unstable similar to baseline function. Outpatient PT is recommended to build strength, stability, and balance. Skilled PT intervention is indicated to address deficits in function, mobility, and to return to PLOF as able.     Recommendations for follow up therapy are one component of a multi-disciplinary discharge planning process, led by the attending physician.  Recommendations may be updated based on patient status, additional functional criteria and insurance authorization.  Follow Up Recommendations Outpatient PT    Equipment Recommendations  Rolling walker with 5" wheels    Recommendations for Other Services       Precautions / Restrictions Precautions Precautions: Fall Restrictions Weight Bearing Restrictions: No      Mobility  Bed Mobility Overal bed mobility: Modified Independent             General bed mobility comments: Increased time, utilized bed rails to pull to EOB    Transfers Overall transfer level: Modified independent Equipment used: None             General transfer comment: no physical assist  provided  Ambulation/Gait Ambulation/Gait assistance: Modified independent (Device/Increase time) Gait Distance (Feet): 175 Feet Assistive device: None Gait Pattern/deviations: Step-through pattern     General Gait Details: Decreased cadence, amb w/ RW and without; pt felt back to baseline w/o RW with reduced gait speed, lack of reciprocal arm swing with no LOB  Stairs            Wheelchair Mobility    Modified Rankin (Stroke Patients Only)       Balance Overall balance assessment: Needs assistance Sitting-balance support: Feet unsupported;No upper extremity supported Sitting balance-Leahy Scale: Good       Standing balance-Leahy Scale: Good Standing balance comment: Pt is able to stand independently and can accept min challenges                             Pertinent Vitals/Pain Pain Assessment: No/denies pain    Home Living Family/patient expects to be discharged to:: Private residence Living Arrangements: Children (son) Available Help at Discharge: Family Type of Home: House Home Access: Stairs to enter Entrance Stairs-Rails: Right Entrance Stairs-Number of Steps: 5 Home Layout: One level Home Equipment: Shower seat;Walker - standard;Cane - quad;Bedside commode      Prior Function Level of Independence: Independent with assistive device(s)         Comments: Ambulates without AD  but will intermittently use quad cane or standard walker if feeling unstable. Pt currently drives, but is considering stopping after last syncopal episode.     Hand Dominance  Extremity/Trunk Assessment   Upper Extremity Assessment Upper Extremity Assessment: Overall WFL for tasks assessed    Lower Extremity Assessment Lower Extremity Assessment: Overall WFL for tasks assessed (SILT to BLE)    Cervical / Trunk Assessment Cervical / Trunk Assessment: Normal  Communication   Communication: No difficulties  Cognition Arousal/Alertness:  Awake/alert Behavior During Therapy: WFL for tasks assessed/performed Overall Cognitive Status: Within Functional Limits for tasks assessed                                        General Comments      Exercises Other Exercises Other Exercises: Orthostatic testing: (supine) 167/72, 72, 98% (seated) 154/68, 78, 98% (standing) 149/78, 83, 99 (standing 2 min): 163/88, 83,98%   Assessment/Plan    PT Assessment Patient needs continued PT services  PT Problem List Decreased strength;Decreased range of motion;Decreased activity tolerance;Decreased balance;Decreased mobility       PT Treatment Interventions Gait training;Stair training;Functional mobility training;Therapeutic activities;Therapeutic exercise;Neuromuscular re-education    PT Goals (Current goals can be found in the Care Plan section)  Acute Rehab PT Goals Patient Stated Goal: To garden PT Goal Formulation: With patient Time For Goal Achievement: 08/11/21 Potential to Achieve Goals: Good    Frequency Min 2X/week   Barriers to discharge        Co-evaluation               AM-PAC PT "6 Clicks" Mobility  Outcome Measure Help needed turning from your back to your side while in a flat bed without using bedrails?: None Help needed moving from lying on your back to sitting on the side of a flat bed without using bedrails?: A Little Help needed moving to and from a bed to a chair (including a wheelchair)?: None Help needed standing up from a chair using your arms (e.g., wheelchair or bedside chair)?: None Help needed to walk in hospital room?: A Little Help needed climbing 3-5 steps with a railing? : A Little 6 Click Score: 21    End of Session Equipment Utilized During Treatment: Gait belt Activity Tolerance: Patient tolerated treatment well Patient left: in chair;with call bell/phone within reach;with chair alarm set Nurse Communication: Mobility status PT Visit Diagnosis: Other abnormalities of  gait and mobility (R26.89);History of falling (Z91.81);Repeated falls (R29.6);Muscle weakness (generalized) (M62.81)    Time: 1219-7588 PT Time Calculation (min) (ACUTE ONLY): 35 min   Charges:             [employer], SPT

## 2021-07-28 NOTE — Plan of Care (Signed)
  Problem: Clinical Measurements: Goal: Ability to maintain clinical measurements within normal limits will improve Outcome: Progressing   

## 2021-07-28 NOTE — Progress Notes (Signed)
*  PRELIMINARY RESULTS* Echocardiogram 2D Echocardiogram has been performed.  Elaine Norris 07/28/2021, 9:37 AM

## 2021-07-28 NOTE — Evaluation (Signed)
Occupational Therapy Evaluation Patient Details Name: Elaine Norris MRN: [id number] DOB: 1946-01-21 Today's Date: 07/28/2021   History of Present Illness Pt is a 75 y.o. F w/ PMH of HTN, OSA and admitted for syncope following episode for LOC.   Clinical Impression   Pt seen for OT evaluation this date in setting of acute hospitalization d/t syncopal episode. Pt presents this date reporting decreased symptoms, but she is noted to get fatigued and only tolerate ~4-5 minute standing intervals for ADLs. Pt is advised if she is noted to have light-headed/dizziness symptoms to safely initiating seated break and elevated LE. Pt with good understanding. Pt reports being INDEP at baseline. She presents this date with decreased fxl activity tolerance and requires CGA for all standing tasks including LB bathing/dressing. RW used for fxl mobility to restroom and pt performs with SUPV. She requests to trial w/o AD back to chair, but requires hand held assist/CGA. Pt left in chair with all needs met and in reach at end of session. Will continue to follow. Anticipate that pt would benefit from [facility] f/u to ensure safety with ADLs in the natural environment.      Recommendations for follow up therapy are one component of a multi-disciplinary discharge planning process, led by the attending physician.  Recommendations may be updated based on patient status, additional functional criteria and insurance authorization.   Follow Up Recommendations  Home health OT;Supervision - Intermittent    Equipment Recommendations  3 in 1 bedside commode;Tub/shower seat    Recommendations for Other Services       Precautions / Restrictions Precautions Precautions: Fall Restrictions Weight Bearing Restrictions: No      Mobility Bed Mobility Overal bed mobility: Modified Independent             General bed mobility comments: Increased time, utilized bed rails to pull to EOB    Transfers Overall transfer  level: Needs assistance Equipment used: Rolling walker (2 wheeled) Transfers: Sit to/from Stand Sit to Stand: Supervision;Min guard         General transfer comment: initially, no physical assist needed, but with subsequent transfers and fatiguing she is noted to have decreased balance and tolernace    Balance Overall balance assessment: Needs assistance Sitting-balance support: Feet unsupported;No upper extremity supported Sitting balance-Leahy Scale: Good       Standing balance-Leahy Scale: Good Standing balance comment: Pt with G static standing balance, F dynamic, benefits from UE support during stnading tasks, but does not require UE support                           ADL either performed or assessed with clinical judgement   ADL Overall ADL's : Needs assistance/impaired                                       General ADL Comments: INDEP for seated UB ADLs, CGA for seated LB ADLs, SETUP/CGA for standing UB ADLs, CGA/MIN A for standing LB ADLs. Minimal c/o dizziness, counter versus RW used for UE support.     Vision Baseline Vision/History: 1 Wears glasses Patient Visual Report: No change from baseline       Perception     Praxis      Pertinent Vitals/Pain Pain Assessment: No/denies pain     Hand Dominance     Extremity/Trunk Assessment Upper Extremity Assessment Upper Extremity  Assessment: Overall WFL for tasks assessed   Lower Extremity Assessment Lower Extremity Assessment: Overall WFL for tasks assessed   Cervical / Trunk Assessment Cervical / Trunk Assessment: Normal   Communication Communication Communication: No difficulties   Cognition Arousal/Alertness: Awake/alert Behavior During Therapy: WFL for tasks assessed/performed Overall Cognitive Status: Within Functional Limits for tasks assessed                                     General Comments       Exercises Other Exercises Other Exercises: OT  educates pt re: safety considerations, fall prevention, orthostatic considerations including not standing longer that ~2-3 min intervals.   Shoulder Instructions      Home Living Family/patient expects to be discharged to:: Private residence Living Arrangements: Children (son)   Type of Home: House Home Access: Stairs to enter Technical brewer of Steps: 5 Entrance Stairs-Rails: Right Home Layout: One level     Bathroom Shower/Tub: Tub/shower unit         Home Equipment: Shower seat;Walker - standard;Cane - quad;Bedside commode          Prior Functioning/Environment Level of Independence: Independent with assistive device(s)        Comments: Ambulates without AD  but will intermittently use quad cane or standard walker if feeling unstable. Pt currently drives, but is considering stopping after last syncopal episode.        OT Problem List: Decreased strength;Decreased activity tolerance      OT Treatment/Interventions: Self-care/ADL training;Therapeutic exercise;Patient/family education;Balance training;Therapeutic activities    OT Goals(Current goals can be found in the care plan section) Acute Rehab OT Goals Patient Stated Goal: To garden OT Goal Formulation: With patient Time For Goal Achievement: 08/11/21 Potential to Achieve Goals: Good  OT Frequency: Min 1X/week   Barriers to D/C:            Co-evaluation              AM-PAC OT "6 Clicks" Daily Activity     Outcome Measure Help from another person eating meals?: None Help from another person taking care of personal grooming?: None Help from another person toileting, which includes using toliet, bedpan, or urinal?: A Little Help from another person bathing (including washing, rinsing, drying)?: A Little Help from another person to put on and taking off regular upper body clothing?: None Help from another person to put on and taking off regular lower body clothing?: A Little 6 Click Score:  21   End of Session Equipment Utilized During Treatment: Gait belt;Rolling walker Nurse Communication: Mobility status  Activity Tolerance: Patient tolerated treatment well Patient left: in chair;with call bell/phone within reach;with chair alarm set  OT Visit Diagnosis: Unsteadiness on feet (R26.81);Muscle weakness (generalized) (M62.81)                Time: 3716-9678 OT Time Calculation (min): 51 min Charges:  OT General Charges $OT Visit: 1 Visit OT Evaluation $OT Eval Moderate Complexity: 1 Mod OT Treatments $Self Care/Home Management : 23-37 mins $Therapeutic Activity: 8-22 mins  Gerrianne Scale, MS, OTR/L ascom [phone] 07/28/21, 4:07 PM

## 2021-07-28 NOTE — Progress Notes (Signed)
Patient refused CPAP for the night  

## 2021-07-28 NOTE — Care Management Obs Status (Signed)
[place] NOTIFICATION   Patient Details  Name: Elaine Norris MRN: [id number] Date of Birth: Feb 06, 1946   Medicare Observation Status Notification Given:  Yes    Candie Chroman, LCSW 07/28/2021, 3:16 PM

## 2021-07-28 NOTE — TOC Initial Note (Signed)
Transition of Care [facility]) - Initial/Assessment Note    Patient Details  Name: Elaine Norris MRN: [id number] Date of Birth: 1946-09-23  Transition of [facility]) CM/SW Contact:    Candie Chroman, LCSW Phone Number: 07/28/2021, 3:18 PM  Clinical Narrative:   CSW met with patient. No supports at bedside. CSW introduced role and explained that PT recommendations would be discussed. Patient unsure if she wants outpatient PT or not. Left list of local offices. Will follow up at discharge to see if she wants to pursue. PT also recommending a rolling walker. Patient stated she already has a standard walker at home. She is unsure if she wants a RW. Will follow up about this as well. No further concerns. CSW encouraged patient to contact CSW as needed. CSW will continue to follow patient for support and facilitate return home when stable.               Expected Discharge Plan: OP Rehab Barriers to Discharge: Continued Medical Work up   Patient Goals and CMS Choice        Expected Discharge Plan and Services Expected Discharge Plan: OP Rehab     Post Acute Care Choice:  (TBD) Living arrangements for the past 2 months: Single Family Home                                      Prior Living Arrangements/Services Living arrangements for the past 2 months: Single Family Home Lives with:: Adult Children Patient language and need for interpreter reviewed:: Yes Do you feel safe going back to the place where you live?: Yes      Need for Family Participation in Patient Care: Yes (Comment) Care giver support system in place?: Yes (comment) Current home services: DME Criminal Activity/Legal Involvement Pertinent to Current Situation/Hospitalization: No - Comment as needed  Activities of Daily Living Home Assistive Devices/Equipment: Cane (specify quad or straight) ADL Screening (condition at time of admission) Patient's cognitive ability adequate to safely complete daily activities?:  Yes Is the patient deaf or have difficulty hearing?: No Does the patient have difficulty seeing, even when wearing glasses/contacts?: No Does the patient have difficulty concentrating, remembering, or making decisions?: No Patient able to express need for assistance with ADLs?: Yes Does the patient have difficulty dressing or bathing?: No Independently performs ADLs?: Yes (appropriate for developmental age) Does the patient have difficulty walking or climbing stairs?: Yes Weakness of Legs: Both Weakness of Arms/Hands: None  Permission Sought/Granted                  Emotional Assessment Appearance:: Appears stated age Attitude/Demeanor/Rapport: Engaged, Gracious Affect (typically observed): Accepting, Appropriate, Calm, Pleasant Orientation: : Oriented to Self, Oriented to Place, Oriented to  Time, Oriented to Situation Alcohol / Substance Use: Not Applicable Psych Involvement: No (comment)  Admission diagnosis:  Dehydration [E86.0] Syncope [R55] Fall, initial encounter [W19.XXXA] Patient Active Problem List   Diagnosis Date Noted   Syncope 07/27/2021   Blood creatinine increased compared with prior measurement 07/27/2021   Fall at home, initial encounter 07/27/2021   Hypokalemia 07/27/2021   Prolonged QT interval 07/27/2021   GERD (gastroesophageal reflux disease)    Sleep apnea    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

## 2021-07-28 NOTE — Progress Notes (Addendum)
Secure chat sent to NP B. Randol Kern about patient's elevated b/p. (Last 3 taken). Currently has LR infusing at 94ml/hr. Has ambulated with assist to bathroom appropriately. No distress or syncopal episodes. Requires 1 assist due to unsteady gait with gripper socks. Patient uses call light appropriately to call for assistance.

## 2021-07-28 NOTE — Progress Notes (Signed)
Cpap declined by pt

## 2021-07-28 NOTE — Progress Notes (Signed)
PROGRESS NOTE  Elaine Norris WVP:[id number] DOB: Jan 22, 1946   PCP: Perrin Maltese, MD  Patient is from: Home.  Lives with his son.  Independently ambulates at baseline.  DOA: 07/27/2021 LOS: 0  Chief complaints:  Chief Complaint  Patient presents with   Fall     Brief Narrative / Interim history: 75 year old F with PMH of OSA not compliant with CPAP, CKD-4, resistant HTN, HLD and depression presenting with syncopal episode and admitted for the same.  Reportedly, she lost consciousness when she was getting out of a car in the parking lot at her PCP office and fell on the left side and hit her head and left elbow on the asphalt of the parking lot.  Reportedly brief LOC but no seizure-like activity or focal neurodeficit.   In ED, hypertensive to 100/54 but improved after IV fluid.  Orthostatic vital signs positive.  CMP with mild hypokalemia to 3.4 and Cr to 2.13.  CBC, serial troponin, UA and CT head without acute finding.  EKG with QTC to 522.  Started on IV fluid.  TTE and carotid US ordered.  Subjective: Seen and examined earlier this morning.  No major events overnight of this morning.  No complaints other than mild headache that she rates as 3/10.  Headache is frontal.  Denies acute vision change, focal numbness, tingling or weakness, nausea, vomiting, dizziness, chest pain, shortness of breath, fever or UTI symptoms.  She says she did not feel dizzy when she got up to walk to the bathroom.  Objective: Vitals:   07/28/21 0528 07/28/21 0637 07/28/21 0900 07/28/21 1125  BP: (!) 192/75 (!) 158/63 (!) 139/56 (!) 156/63  Pulse: 61 81 91 85  Resp:   17 19  Temp:   98.2 F (36.8 C) 98.5 F (36.9 C)  TempSrc:    Oral  SpO2:   98% 96%  Weight:      Height:        Intake/Output Summary (Last 24 hours) at 07/28/2021 1246 Last data filed at 07/28/2021 0945 Gross per 24 hour  Intake 1662.69 ml  Output --  Net 1662.69 ml   Filed Weights   07/27/21 0951 07/27/21 2226  Weight:  79.4 kg 80.7 kg    Examination:  GENERAL: No apparent distress.  Nontoxic. HEENT: MMM.  Vision and hearing grossly intact.  NECK: Supple.  No apparent JVD.  RESP: 96% on RA.  No IWOB.  Fair aeration bilaterally. CVS:  RRR. Heart sounds normal.  ABD/GI/GU: BS+. Abd soft, NTND.  MSK/EXT:  Moves extremities. No apparent deformity. No edema.  SKIN: no apparent skin lesion or wound NEURO: Awake, alert and oriented appropriately.  No apparent focal neuro deficit. PSYCH: Calm. Normal affect.   Procedures:  None  Microbiology summarized: NIOEV-03 and influenza PCR nonreactive.  Assessment & Plan: Syncope/orthostatic hypotension-likely from multiple antihypertensive meds.  She was hypotensive with positive orthostatic vitals on presentation.  Work-up unrevealing except for mild hypokalemia and prolonged QTC to 522.  Patient is on multiple antihypertensive meds.  She is also on Ativan and baclofen which could increase her risk of fall.  Carotid ultrasound without significant finding.  QTC improved to 454.  -Follow TTE -Recheck orthostatic vitals -Slowly introduce BP meds as blood pressure allows. -Fall precaution -Avoid or minimize sedating medications -Continue telemetry monitoring -PT/OT eval  CKD-4?/azotemia: She is on multiple antihypertensive meds and PPI which could contribute.  Followed by Dr. Juleen China. Recent Labs    04/07/21 1220 07/27/21 0855 07/28/21 0423  BUN 20 34* 31*  CREATININE 1.93* 2.13* 1.71*  -Renally dose medications specially her baclofen. -Continue monitoring  Resistant hypertension-was hypotensive to 81/59.  Hypotension resolved with IV fluid.  Now SBP in 150s and DBP in 60s. -Discontinue IV fluid -Continue home hydralazine-resumed overnight. -Resume home labetalol-100 mg twice daily -Continue holding Lasix, Imdur and losartan  OSA not compliant with CPAP -Discussed the importance of this and encouraged using CPAP  Prolonged QT-522 on presentation and  improved to 454. -Optimize K and Mg -Continue telemetry monitoring  Normocytic anemia: Likely dilutional from IV fluid.  She denies melena or hematochezia. Recent Labs    04/07/21 1220 07/27/21 0855 07/28/21 0423  HGB 12.3 11.3* 10.9*  -Discontinue IV fluid -Check anemia panel in the morning -Recheck CBC in the morning  Hypokalemia: K3.4.  Mg 2.1. -K-Dur 40 mill equivalent x2 -Recheck in the morning  Depression: Stable. -Resume home Celexa  Hyperlipidemia -Continue home statin.  Body mass index is 29.6 kg/m.         DVT prophylaxis:  SCDs Start: 07/27/21 1942  Code Status: Full code Family Communication: Updated patient's son over the phone. Level of care: Progressive Cardiac Status is: Observation  The patient will require care spanning > 2 midnights and should be moved to inpatient because: Hemodynamically unstable, Ongoing diagnostic testing needed not appropriate for outpatient work up, Inpatient level of care appropriate due to severity of illness, and close monitoring of blood pressure while introducing home antihypertensive meds  Dispo: The patient is from: Home              Anticipated d/c is to: Home              Patient currently is not medically stable to d/c.   Difficult to place patient No       Consultants:  None   Sch Meds:  Scheduled Meds:  allopurinol  100 mg Oral QHS   aspirin EC  81 mg Oral QHS   atorvastatin  40 mg Oral QHS   baclofen  5 mg Oral BID   calcium-vitamin D  1 tablet Oral BID   citalopram  20 mg Oral Daily   hydrALAZINE  100 mg Oral BID   [START ON 07/29/2021] influenza vaccine adjuvanted  0.5 mL Intramuscular Tomorrow-1000   labetalol  100 mg Oral BID   multivitamin with minerals  1 tablet Oral Daily   pantoprazole  40 mg Oral Daily   potassium chloride  40 mEq Oral Q4H   Continuous Infusions: PRN Meds:.acetaminophen **OR** acetaminophen, LORazepam  Antimicrobials: Anti-infectives (From admission, onward)     None        I have personally reviewed the following labs and images: CBC: Recent Labs  Lab 07/27/21 0855 07/28/21 0423  WBC 7.8 7.5  HGB 11.3* 10.9*  HCT 33.2* 32.6*  MCV 92.7 93.7  PLT 193 153   BMP &GFR Recent Labs  Lab 07/27/21 0855 07/27/21 1820 07/28/21 0423  NA 138  --  138  K 3.4*  --  3.4*  CL 98  --  103  CO2 29  --  29  GLUCOSE 114*  --  94  BUN 34*  --  31*  CREATININE 2.13*  --  1.71*  CALCIUM 9.2  --  8.8*  MG  --  2.1 2.1   Estimated Creatinine Clearance: 29.8 mL/min (A) (by C-G formula based on SCr of 1.71 mg/dL (H)). Liver & Pancreas: Recent Labs  Lab 07/28/21 0423  AST 24  ALT 20  ALKPHOS 65  BILITOT 0.9  PROT 6.5  ALBUMIN 3.8   No results for input(s): LIPASE, AMYLASE in the last 168 hours. No results for input(s): AMMONIA in the last 168 hours. Diabetic: No results for input(s): HGBA1C in the last 72 hours. No results for input(s): GLUCAP in the last 168 hours. Cardiac Enzymes: No results for input(s): CKTOTAL, CKMB, CKMBINDEX, TROPONINI in the last 168 hours. No results for input(s): PROBNP in the last 8760 hours. Coagulation Profile: No results for input(s): INR, PROTIME in the last 168 hours. Thyroid Function Tests: No results for input(s): TSH, T4TOTAL, FREET4, T3FREE, THYROIDAB in the last 72 hours. Lipid Profile: No results for input(s): CHOL, HDL, LDLCALC, TRIG, CHOLHDL, LDLDIRECT in the last 72 hours. Anemia Panel: No results for input(s): VITAMINB12, FOLATE, FERRITIN, TIBC, IRON, RETICCTPCT in the last 72 hours. Urine analysis:    Component Value Date/Time   COLORURINE YELLOW (A) 07/27/2021 1827   APPEARANCEUR HAZY (A) 07/27/2021 1827   LABSPEC 1.014 07/27/2021 1827   PHURINE 5.0 07/27/2021 1827   GLUCOSEU NEGATIVE 07/27/2021 1827   HGBUR NEGATIVE 07/27/2021 1827   BILIRUBINUR NEGATIVE 07/27/2021 1827   KETONESUR NEGATIVE 07/27/2021 1827   PROTEINUR NEGATIVE 07/27/2021 1827   NITRITE NEGATIVE 07/27/2021 1827    LEUKOCYTESUR NEGATIVE 07/27/2021 1827   Sepsis Labs: Invalid input(s): PROCALCITONIN, [place]  Microbiology: Recent Results (from the past 240 hour(s))  Resp Panel by RT-PCR (Flu A&B, Covid) Nasopharyngeal Swab     Status: None   Collection Time: 07/27/21  7:31 PM   Specimen: Nasopharyngeal Swab; Nasopharyngeal(NP) swabs in vial transport medium  Result Value Ref Range Status   SARS Coronavirus 2 by RT PCR NEGATIVE NEGATIVE Final    Comment: (NOTE) SARS-CoV-2 target nucleic acids are NOT DETECTED.  The SARS-CoV-2 RNA is generally detectable in upper respiratory specimens during the acute phase of infection. The lowest concentration of SARS-CoV-2 viral copies this assay can detect is 138 copies/mL. A negative result does not preclude SARS-Cov-2 infection and should not be used as the sole basis for treatment or other patient management decisions. A negative result may occur with  improper specimen collection/handling, submission of specimen other than nasopharyngeal swab, presence of viral mutation(s) within the areas targeted by this assay, and inadequate number of viral copies(<138 copies/mL). A negative result must be combined with clinical observations, patient history, and epidemiological information. The expected result is Negative.  Fact Sheet for Patients:  EntrepreneurPulse.com.au  Fact Sheet for Healthcare Providers:  IncredibleEmployment.be  This test is no t yet approved or cleared by the [place] FDA and  has been authorized for detection and/or diagnosis of SARS-CoV-2 by FDA under an Emergency Use Authorization (EUA). This EUA will remain  in effect (meaning this test can be used) for the duration of the COVID-19 declaration under Section 564(b)(1) of the Act, 21 U.S.C.section 360bbb-3(b)(1), unless the authorization is terminated  or revoked sooner.       Influenza A by PCR NEGATIVE NEGATIVE Final   Influenza B by  PCR NEGATIVE NEGATIVE Final    Comment: (NOTE) The Xpert Xpress SARS-CoV-2/FLU/RSV plus assay is intended as an aid in the diagnosis of influenza from Nasopharyngeal swab specimens and should not be used as a sole basis for treatment. Nasal washings and aspirates are unacceptable for Xpert Xpress SARS-CoV-2/FLU/RSV testing.  Fact Sheet for Patients: EntrepreneurPulse.com.au  Fact Sheet for Healthcare Providers: IncredibleEmployment.be  This test is not yet approved or cleared by the [place] FDA and has  been authorized for detection and/or diagnosis of SARS-CoV-2 by FDA under an Emergency Use Authorization (EUA). This EUA will remain in effect (meaning this test can be used) for the duration of the COVID-19 declaration under Section 564(b)(1) of the Act, 21 U.S.C. section 360bbb-3(b)(1), unless the authorization is terminated or revoked.  Performed at [facility], [address], [place], [place] [address]     Radiology Studies: DG Chest 2 View  Result Date: 07/27/2021 CLINICAL DATA:  Shortness of breath EXAM: CHEST - 2 VIEW COMPARISON:  04/07/2021 FINDINGS: Heart and mediastinal contours are within normal limits. No focal opacities or effusions. No acute bony abnormality. IMPRESSION: No active cardiopulmonary disease. Electronically Signed   By: Rolm Baptise M.D.   On: 07/27/2021 13:00   US Carotid Bilateral  Result Date: 07/28/2021 CLINICAL DATA:  75 year old female with syncope EXAM: BILATERAL CAROTID DUPLEX ULTRASOUND TECHNIQUE: Pearline Cables scale imaging, color Doppler and duplex ultrasound were performed of bilateral carotid and vertebral arteries in the neck. COMPARISON:  None. FINDINGS: Criteria: Quantification of carotid stenosis is based on velocity parameters that correlate the residual internal carotid diameter with NASCET-based stenosis levels, using the diameter of the distal internal carotid lumen as the denominator for  stenosis measurement. The following velocity measurements were obtained: RIGHT ICA:  Systolic 859 cm/sec, Diastolic 33 cm/sec CCA:  76 cm/sec SYSTOLIC ICA/CCA RATIO:  1.5 ECA:  190 cm/sec LEFT ICA:  Systolic 292 cm/sec, Diastolic 39 cm/sec CCA:  72 cm/sec SYSTOLIC ICA/CCA RATIO:  1.8 ECA:  113 cm/sec Right Brachial SBP: Not acquired Left Brachial SBP: Not acquired RIGHT CAROTID ARTERY: No significant calcifications of the right common carotid artery. Intermediate waveform maintained. Heterogeneous and partially calcified plaque at the right carotid bifurcation. No significant lumen shadowing. Low resistance waveform of the right ICA. No significant tortuosity. RIGHT VERTEBRAL ARTERY: Antegrade flow with low resistance waveform. LEFT CAROTID ARTERY: No significant calcifications of the left common carotid artery. Intermediate waveform maintained. Heterogeneous and partially calcified plaque at the left carotid bifurcation without significant lumen shadowing. Low resistance waveform of the left ICA. No significant tortuosity. LEFT VERTEBRAL ARTERY:  Antegrade flow with low resistance waveform. IMPRESSION: Color duplex indicates minimal heterogeneous and calcified plaque, with no hemodynamically significant stenosis by duplex criteria in the extracranial cerebrovascular circulation. Signed, Dulcy Fanny. Dellia Nims, RPVI [facility] Electronically Signed   By: Corrie Mckusick D.O.   On: 07/28/2021 08:41      Clariza Sickman T. [place]  If 7PM-7AM, please contact night-coverage [email or web address] 07/28/2021, 12:46 PM

## 2021-07-29 DIAGNOSIS — R9431 Abnormal electrocardiogram [ECG] [EKG]: Secondary | ICD-10-CM | POA: Diagnosis not present

## 2021-07-29 DIAGNOSIS — W19XXXA Unspecified fall, initial encounter: Secondary | ICD-10-CM | POA: Diagnosis not present

## 2021-07-29 DIAGNOSIS — I10 Essential (primary) hypertension: Secondary | ICD-10-CM | POA: Diagnosis not present

## 2021-07-29 DIAGNOSIS — N179 Acute kidney failure, unspecified: Secondary | ICD-10-CM

## 2021-07-29 DIAGNOSIS — K219 Gastro-esophageal reflux disease without esophagitis: Secondary | ICD-10-CM | POA: Diagnosis not present

## 2021-07-29 DIAGNOSIS — G4733 Obstructive sleep apnea (adult) (pediatric): Secondary | ICD-10-CM | POA: Diagnosis not present

## 2021-07-29 DIAGNOSIS — Y92009 Unspecified place in unspecified non-institutional (private) residence as the place of occurrence of the external cause: Secondary | ICD-10-CM | POA: Diagnosis not present

## 2021-07-29 DIAGNOSIS — R55 Syncope and collapse: Secondary | ICD-10-CM | POA: Diagnosis not present

## 2021-07-29 LAB — RENAL FUNCTION PANEL
Albumin: 3.4 g/dL — ABNORMAL LOW (ref 3.5–5.0)
Anion gap: 9 (ref 5–15)
BUN: 31 mg/dL — ABNORMAL HIGH (ref 8–23)
CO2: 24 mmol/L (ref 22–32)
Calcium: 8.9 mg/dL (ref 8.9–10.3)
Chloride: 104 mmol/L (ref 98–111)
Creatinine, Ser: 1.71 mg/dL — ABNORMAL HIGH (ref 0.44–1.00)
GFR, Estimated: 31 mL/min — ABNORMAL LOW (ref >60)
Glucose, Bld: 83 mg/dL (ref 70–99)
Phosphorus: 3.5 mg/dL (ref 2.5–4.6)
Potassium: 4.5 mmol/L (ref 3.5–5.1)
Sodium: 137 mmol/L (ref 135–145)

## 2021-07-29 LAB — FOLATE: Folate: 30 ng/mL (ref >5.9)

## 2021-07-29 LAB — RETICULOCYTES
Immature Retic Fract: 8.4 % (ref 2.3–15.9)
RBC.: 3.46 MIL/uL — ABNORMAL LOW (ref 3.87–5.11)
Retic Count, Absolute: 51.9 10*3/uL (ref 19.0–186.0)
Retic Ct Pct: 1.5 % (ref 0.4–3.1)

## 2021-07-29 LAB — CBC
HCT: 31.7 % — ABNORMAL LOW (ref 36.0–46.0)
Hemoglobin: 10.9 g/dL — ABNORMAL LOW (ref 12.0–15.0)
MCH: 32.8 pg (ref 26.0–34.0)
MCHC: 34.4 g/dL (ref 30.0–36.0)
MCV: 95.5 fL (ref 80.0–100.0)
Platelets: 160 10*3/uL (ref 150–400)
RBC: 3.32 MIL/uL — ABNORMAL LOW (ref 3.87–5.11)
RDW: 13 % (ref 11.5–15.5)
WBC: 6.5 10*3/uL (ref 4.0–10.5)
nRBC: 0 % (ref 0.0–0.2)

## 2021-07-29 LAB — IRON AND TIBC
Iron: 58 ug/dL (ref 28–170)
Saturation Ratios: 20 % (ref 10.4–31.8)
TIBC: 298 ug/dL (ref 250–450)
UIBC: 240 ug/dL

## 2021-07-29 LAB — MAGNESIUM: Magnesium: 2.1 mg/dL (ref 1.7–2.4)

## 2021-07-29 LAB — CK: Total CK: 140 U/L (ref 38–234)

## 2021-07-29 LAB — FERRITIN: Ferritin: 128 ng/mL (ref 11–307)

## 2021-07-29 LAB — VITAMIN B12: Vitamin B-12: 395 pg/mL (ref 180–914)

## 2021-07-29 MED ORDER — ISOSORBIDE MONONITRATE ER 30 MG PO TB24
30.0000 mg | ORAL_TABLET | Freq: Every day | ORAL | Status: DC
  Administered 2021-07-29: 30 mg via ORAL
  Filled 2021-07-29: qty 1

## 2021-07-29 MED ORDER — HYDRALAZINE HCL 50 MG PO TABS: 50.0000 mg | ORAL_TABLET | Freq: Three times a day (TID) | ORAL | 1 refills | Status: DC

## 2021-07-29 MED ORDER — LOSARTAN POTASSIUM 100 MG PO TABS: 100.0000 mg | ORAL_TABLET | Freq: Every day | ORAL | Status: DC

## 2021-07-29 MED ORDER — FUROSEMIDE 40 MG PO TABS
40.0000 mg | ORAL_TABLET | Freq: Every day | ORAL | Status: DC
  Administered 2021-07-29: 40 mg via ORAL
  Filled 2021-07-29: qty 1

## 2021-07-29 MED ORDER — BACLOFEN 5 MG PO TABS: 5.0000 mg | ORAL_TABLET | Freq: Two times a day (BID) | ORAL | 0 refills | Status: DC

## 2021-07-29 NOTE — Discharge Summary (Signed)
Physician Discharge Summary  Elaine Norris YYT:[id number] DOB: 1946/02/23 DOA: 07/27/2021  PCP: Perrin Maltese, MD  Admit date: 07/27/2021 Discharge date: 07/29/2021 Admitted From: Home Disposition: Home Recommendations for Outpatient Follow-up:  Follow ups as below. Please obtain CBC/BMP/Mag at follow up Reassess blood pressure and resume/adjust antihypertensive meds Encourage CPAP Please follow up on the following pending results: Echocardiogram Home Health: PT/OT/RN Equipment/Devices: 3 in 1 commode, shower seat Discharge Condition: Stable CODE STATUS: Full code  Follow-up Information     Perrin Maltese, MD In 1 week.   Specialty: Internal Medicine Why: As needed Contact information: [place] [address] [phone]                Hospital Course: 75 year old F with PMH of OSA not compliant with CPAP, CKD-4, resistant HTN, HLD and depression presenting with syncopal episode and admitted for the same.  Reportedly, she lost consciousness when she was getting out of a car in the parking lot at her PCP office and fell on the left side and hit her head and left elbow on the asphalt of the parking lot.  Reportedly brief LOC but no seizure-like activity or focal neurodeficit.    In ED, hypertensive to 100/54 but improved after IV fluid.  Orthostatic vital signs positive.  CMP with mild hypokalemia to 3.4 and Cr to 2.13.  CBC, serial troponin, UA and CT head without acute finding.  EKG with QTC to 522.  Started on IV fluid.    The next day, blood pressure improved.  IV fluid discontinued.  Home hydralazine and labetalol resumed.  Bilateral carotid US without significant stenosis.  TTE pending.  On the day of discharge, home Lasix was resumed.  Blood pressure and orthostatic vitals improved.  She was not symptomatic during orthostatic vital.  She felt well and ready to go home.  Home losartan and Imdur held.  Added hydralazine at 50 mg 3 times daily.  She  has been encouraged to use his CPAP and follow-up with her cardiologist  Follow on echocardiogram.  Recheck BP and adjust antihypertensive meds as appropriate. See individual problem list below for more on hospital course.  Discharge Diagnoses:  Syncope/orthostatic hypotension-likely from multiple antihypertensive meds.  She was hypotensive with positive orthostatic vitals on presentation.  Work-up unrevealing except for mild hypokalemia and prolonged QTC to 522.  Patient is on multiple antihypertensive meds.  She is also on Ativan and baclofen which could increase her risk of fall.  Carotid ultrasound without significant finding.  QTC improved to 454.  Orthostatic vitals improved.  No symptoms during orthostatic vital check. -Adjusted BP meds-continue home labetalol and Lasix.  -Hold home losartan and Imdur.  Hydralazine 50 mg 3 times daily -Outpatient follow-up with PCP and cardiology as above. -Follow TTE   CKD-4?/azotemia: She is on multiple antihypertensive meds and PPI which could contribute.  Followed by Dr. Juleen China. Recent Labs    04/07/21 1220 07/27/21 0855 07/28/21 0423 07/29/21 0601  BUN 20 34* 31* 31*  CREATININE 1.93* 2.13* 1.71* 1.71*  -Adjusted cardiac meds as above. -Recheck renal function at follow-up -Decreased on baclofen to 5 mg twice daily   Resistant hypertension-was hypotensive to 81/59.  Hypotension resolved. -Antihypertensive meds as above -Encouraged to wear his CPAP   OSA not compliant with CPAP -Discussed the importance of this and encouraged using CPAP   Prolonged QT-522 on presentation and improved to 454. -Avoid or minimize QT prolonging drugs   Normocytic anemia: Stable after initial  drop.  Likely dilutional.  She denies melena or hematochezia. Recent Labs    04/07/21 1220 07/27/21 0855 07/28/21 0423 07/29/21 0601  HGB 12.3 11.3* 10.9* 10.9*  -Recheck CBC at follow-up   Hypokalemia: Resolved.   Depression: Stable. -Continue home Celexa    Hyperlipidemia -Continue home statin.   Body mass index is 30.49 kg/m.           Discharge Exam: Vitals:   07/29/21 1420 07/29/21 1422 07/29/21 1426 07/29/21 1655  BP: (!) 135/57 (!) 106/48 107/66 (!) 153/69  Pulse: 66 70 69 66  Temp:    98.1 F (36.7 C)  Resp:    14  Height:      Weight:      SpO2:    99%  TempSrc:      BMI (Calculated):         GENERAL: No apparent distress.  Nontoxic. HEENT: MMM.  Vision and hearing grossly intact.  NECK: Supple.  No apparent JVD.  RESP: On RA.  No IWOB.  Fair aeration bilaterally. CVS:  RRR. Heart sounds normal.  ABD/GI/GU: Bowel sounds present. Soft. Non tender.  MSK/EXT:  Moves extremities. No apparent deformity. No edema.  SKIN: no apparent skin lesion or wound NEURO: Awake and alert.  Oriented appropriately.  No apparent focal neuro deficit. PSYCH: Calm. Normal affect.   Discharge Instructions  Discharge Instructions     Call MD for:  difficulty breathing, headache or visual disturbances   Complete by: As directed    Call MD for:  extreme fatigue   Complete by: As directed    Call MD for:  persistant dizziness or light-headedness   Complete by: As directed    Diet - low sodium heart healthy   Complete by: As directed    Discharge instructions   Complete by: As directed    It has been a pleasure taking care of you!  You were hospitalized due to syncopal episode.  This is likely due to low blood pressure from blood pressure medications.  The test is we have done so far did not show any other significant finding to explain the syncope but your echocardiogram result has not come out yet.  It is very important that you wear your CPAP at night which could help with your blood pressure, and other major complications related to sleep apnea.  We made some adjustment to your blood pressure medications during this hospitalization.  Please review your new medication list and the directions on your medications before you take them.   We recommend not starting you losartan until you follow-up with your primary care doctor or cardiologist.    Take care,   Increase activity slowly   Complete by: As directed       Allergies as of 07/29/2021       Reactions   Fire Ant Anaphylaxis   Gabapentin    Muscles jerks, weakness and difficulty swallowing - Noted in hospital stay   Albuterol Other (See Comments)   Pt reports seizures   Codeine Other (See Comments)   Altered mental status        Medication List     STOP taking these medications    isosorbide mononitrate 30 MG 24 hr tablet Commonly known as: IMDUR   meclizine 12.5 MG tablet Commonly known as: ANTIVERT       TAKE these medications    acetaminophen 500 MG tablet Commonly known as: TYLENOL Take 500-1,000 mg by mouth every 6 (six) hours as needed  for mild pain or fever.   alendronate 70 MG tablet Commonly known as: FOSAMAX Take 70 mg by mouth every Saturday.   allopurinol 100 MG tablet Commonly known as: ZYLOPRIM Take 100 mg by mouth at bedtime.   aspirin 81 MG tablet Take 81 mg by mouth at bedtime.   atorvastatin 40 MG tablet Commonly known as: LIPITOR Take 40 mg by mouth at bedtime.   Baclofen 5 MG Tabs Take 5 mg by mouth 2 (two) times daily. What changed:  medication strength how much to take   calcium-vitamin D 250-100 MG-UNIT tablet Take 1 tablet by mouth 2 (two) times daily.   citalopram 20 MG tablet Commonly known as: CELEXA Take 20 mg by mouth daily.   EPINEPHrine 0.3 mg/0.3 mL Soaj injection Commonly known as: EPI-PEN Inject 0.3 mg into the muscle as needed for anaphylaxis.   furosemide 40 MG tablet Commonly known as: LASIX Take 40 mg by mouth daily.   hydrALAZINE 50 MG tablet Commonly known as: APRESOLINE Take 1 tablet (50 mg total) by mouth 3 (three) times daily. What changed:  medication strength how much to take when to take this   labetalol 100 MG tablet Commonly known as: NORMODYNE Take 100 mg by  mouth 2 (two) times daily.   loperamide 2 MG tablet Commonly known as: Imodium A-D Take 1 tablet (2 mg total) by mouth 4 (four) times daily as needed for diarrhea or loose stools.   LORazepam 0.5 MG tablet Commonly known as: ATIVAN Take 0.5 mg by mouth 2 (two) times daily as needed.   losartan 100 MG tablet Commonly known as: COZAAR Take 1 tablet (100 mg total) by mouth daily. Start taking on: August 05, 2021 What changed: These instructions start on August 05, 2021. If you are unsure what to do until then, ask your doctor or other care provider.   multivitamin with minerals Tabs tablet Take 1 tablet by mouth daily.   nitroGLYCERIN 0.4 MG SL tablet Commonly known as: NITROSTAT Place 0.4 mg under the tongue every 5 (five) minutes as needed for chest pain.   ondansetron 4 MG disintegrating tablet Commonly known as: Zofran ODT Take 1 tablet (4 mg total) by mouth every 8 (eight) hours as needed for nausea or vomiting.   pantoprazole 40 MG tablet Commonly known as: PROTONIX Take 40 mg by mouth daily.               Durable Medical Equipment  (From admission, onward)           Start     Ordered   07/29/21 1701  For home use only DME Bedside commode  Once       Question:  Patient needs a bedside commode to treat with the following condition  Answer:  Generalized weakness   07/29/21 1700   07/29/21 1701  For home use only DME Shower stool  Once        07/29/21 1700            Consultations: None  Procedures/Studies: None   DG Chest 2 View  Result Date: 07/27/2021 CLINICAL DATA:  Shortness of breath EXAM: CHEST - 2 VIEW COMPARISON:  04/07/2021 FINDINGS: Heart and mediastinal contours are within normal limits. No focal opacities or effusions. No acute bony abnormality. IMPRESSION: No active cardiopulmonary disease. Electronically Signed   By: Rolm Baptise M.D.   On: 07/27/2021 13:00   CT HEAD WO CONTRAST  Result Date: 07/27/2021 CLINICAL DATA:  Fall  with head  injury EXAM: CT HEAD WITHOUT CONTRAST TECHNIQUE: Contiguous axial images were obtained from the base of the skull through the vertex without intravenous contrast. COMPARISON:  03/28/2019 FINDINGS: Brain: No evidence of acute infarction, hemorrhage, hydrocephalus, extra-axial collection or mass lesion/mass effect. Vascular: No hyperdense vessel or unexpected calcification. Skull: Normal. Negative for fracture or focal lesion. Left parietal scalp contusion Sinuses/Orbits: Bilateral cataract resection. Minimally covered right maxillary sinus with fluid level IMPRESSION: 1. No evidence of intracranial injury. 2. Left posterior scalp contusion without fracture. 3. Right maxillary sinus fluid level. Electronically Signed   By: Jorje Guild M.D.   On: 07/27/2021 10:53   US Carotid Bilateral  Result Date: 07/28/2021 CLINICAL DATA:  75 year old female with syncope EXAM: BILATERAL CAROTID DUPLEX ULTRASOUND TECHNIQUE: Pearline Cables scale imaging, color Doppler and duplex ultrasound were performed of bilateral carotid and vertebral arteries in the neck. COMPARISON:  None. FINDINGS: Criteria: Quantification of carotid stenosis is based on velocity parameters that correlate the residual internal carotid diameter with NASCET-based stenosis levels, using the diameter of the distal internal carotid lumen as the denominator for stenosis measurement. The following velocity measurements were obtained: RIGHT ICA:  Systolic 818 cm/sec, Diastolic 33 cm/sec CCA:  76 cm/sec SYSTOLIC ICA/CCA RATIO:  1.5 ECA:  190 cm/sec LEFT ICA:  Systolic 299 cm/sec, Diastolic 39 cm/sec CCA:  72 cm/sec SYSTOLIC ICA/CCA RATIO:  1.8 ECA:  113 cm/sec Right Brachial SBP: Not acquired Left Brachial SBP: Not acquired RIGHT CAROTID ARTERY: No significant calcifications of the right common carotid artery. Intermediate waveform maintained. Heterogeneous and partially calcified plaque at the right carotid bifurcation. No significant lumen shadowing. Low  resistance waveform of the right ICA. No significant tortuosity. RIGHT VERTEBRAL ARTERY: Antegrade flow with low resistance waveform. LEFT CAROTID ARTERY: No significant calcifications of the left common carotid artery. Intermediate waveform maintained. Heterogeneous and partially calcified plaque at the left carotid bifurcation without significant lumen shadowing. Low resistance waveform of the left ICA. No significant tortuosity. LEFT VERTEBRAL ARTERY:  Antegrade flow with low resistance waveform. IMPRESSION: Color duplex indicates minimal heterogeneous and calcified plaque, with no hemodynamically significant stenosis by duplex criteria in the extracranial cerebrovascular circulation. Signed, Dulcy Fanny. Dellia Nims, RPVI [facility] Electronically Signed   By: Corrie Mckusick D.O.   On: 07/28/2021 08:41   US BREAST LTD UNI RIGHT INC AXILLA  Result Date: 07/06/2021 CLINICAL DATA:  Annual mammogram and 2 year follow-up of probably benign mass in the right breast. EXAM: DIGITAL DIAGNOSTIC BILATERAL MAMMOGRAM WITH TOMOSYNTHESIS AND CAD; ULTRASOUND RIGHT BREAST LIMITED TECHNIQUE: Bilateral digital diagnostic mammography and breast tomosynthesis was performed. The images were evaluated with computer-aided detection.; Targeted ultrasound examination of the right breast was performed COMPARISON:  Previous exam(s). ACR Breast Density Category b: There are scattered areas of fibroglandular density. FINDINGS: Previously noted oval mass in the upper central breast at middle depth is mammographically stable compared to 07/05/2020, best visualized on the CC view. No new suspicious masses, calcifications, or other findings in either breast. Targeted ultrasound of the right breast 1 o'clock position 5 cm from the nipple demonstrates a blank hypoechoic mass, previously measuring 1.2 x 0.6 x 1.4 cm on 07/05/2020 and 1.6 x 0.6 x 0.4 cm on 06/29/2019. IMPRESSION: 1.  Documented 2 years of stability of the mass at the right breast 1 o'clock position, consistent with benignity. No additional dedicated follow-up imaging is indicated. 2. No mammographic findings of malignancy in either breast. RECOMMENDATION: Annual screening mammogram. I have discussed the findings  and recommendations with the patient. If applicable, a reminder letter will be sent to the patient regarding the next appointment. BI-RADS CATEGORY  2: Benign. Electronically Signed   By: Ileana Roup M.D.   On: 07/06/2021 10:36  MM DIAG BREAST TOMO BILATERAL  Result Date: 07/06/2021 CLINICAL DATA:  Annual mammogram and 2 year follow-up of probably benign mass in the right breast. EXAM: DIGITAL DIAGNOSTIC BILATERAL MAMMOGRAM WITH TOMOSYNTHESIS AND CAD; ULTRASOUND RIGHT BREAST LIMITED TECHNIQUE: Bilateral digital diagnostic mammography and breast tomosynthesis was performed. The images were evaluated with computer-aided detection.; Targeted ultrasound examination of the right breast was performed COMPARISON:  Previous exam(s). ACR Breast Density Category b: There are scattered areas of fibroglandular density. FINDINGS: Previously noted oval mass in the upper central breast at middle depth is mammographically stable compared to 07/05/2020, best visualized on the CC view. No new suspicious masses, calcifications, or other findings in either breast. Targeted ultrasound of the right breast 1 o'clock position 5 cm from the nipple demonstrates a blank hypoechoic mass, previously measuring 1.2 x 0.6 x 1.4 cm on 07/05/2020 and 1.6 x 0.6 x 0.4 cm on 06/29/2019. IMPRESSION: 1. Documented 2 years of stability of the mass at the right breast 1 o'clock position, consistent with benignity. No additional dedicated follow-up imaging is indicated. 2. No mammographic findings of malignancy in either breast. RECOMMENDATION: Annual screening mammogram. I have discussed the findings and recommendations with the patient. If applicable, a  reminder letter will be sent to the patient regarding the next appointment. BI-RADS CATEGORY  2: Benign. Electronically Signed   By: Ileana Roup M.D.   On: 07/06/2021 10:36      The results of significant diagnostics from this hospitalization (including imaging, microbiology, ancillary and laboratory) are listed below for reference.     Microbiology: Recent Results (from the past 240 hour(s))  Resp Panel by RT-PCR (Flu A&B, Covid) Nasopharyngeal Swab     Status: None   Collection Time: 07/27/21  7:31 PM   Specimen: Nasopharyngeal Swab; Nasopharyngeal(NP) swabs in vial transport medium  Result Value Ref Range Status   SARS Coronavirus 2 by RT PCR NEGATIVE NEGATIVE Final    Comment: (NOTE) SARS-CoV-2 target nucleic acids are NOT DETECTED.  The SARS-CoV-2 RNA is generally detectable in upper respiratory specimens during the acute phase of infection. The lowest concentration of SARS-CoV-2 viral copies this assay can detect is 138 copies/mL. A negative result does not preclude SARS-Cov-2 infection and should not be used as the sole basis for treatment or other patient management decisions. A negative result may occur with  improper specimen collection/handling, submission of specimen other than nasopharyngeal swab, presence of viral mutation(s) within the areas targeted by this assay, and inadequate number of viral copies(<138 copies/mL). A negative result must be combined with clinical observations, patient history, and epidemiological information. The expected result is Negative.  Fact Sheet for Patients:  EntrepreneurPulse.com.au  Fact Sheet for Healthcare Providers:  IncredibleEmployment.be  This test is no t yet approved or cleared by the [place] FDA and  has been authorized for detection and/or diagnosis of SARS-CoV-2 by FDA under an Emergency Use Authorization (EUA). This EUA will remain  in effect (meaning this test can be used) for  the duration of the COVID-19 declaration under Section 564(b)(1) of the Act, 21 U.S.C.section 360bbb-3(b)(1), unless the authorization is terminated  or revoked sooner.       Influenza A by PCR NEGATIVE NEGATIVE Final   Influenza B by PCR NEGATIVE NEGATIVE Final  Comment: (NOTE) The Xpert Xpress SARS-CoV-2/FLU/RSV plus assay is intended as an aid in the diagnosis of influenza from Nasopharyngeal swab specimens and should not be used as a sole basis for treatment. Nasal washings and aspirates are unacceptable for Xpert Xpress SARS-CoV-2/FLU/RSV testing.  Fact Sheet for Patients: EntrepreneurPulse.com.au  Fact Sheet for Healthcare Providers: IncredibleEmployment.be  This test is not yet approved or cleared by the [place] FDA and has been authorized for detection and/or diagnosis of SARS-CoV-2 by FDA under an Emergency Use Authorization (EUA). This EUA will remain in effect (meaning this test can be used) for the duration of the COVID-19 declaration under Section 564(b)(1) of the Act, 21 U.S.C. section 360bbb-3(b)(1), unless the authorization is terminated or revoked.  Performed at [facility], [address] [place], [place] [address]      Labs:  CBC: Recent Labs  Lab 07/27/21 0855 07/28/21 0423 07/29/21 0601  WBC 7.8 7.5 6.5  HGB 11.3* 10.9* 10.9*  HCT 33.2* 32.6* 31.7*  MCV 92.7 93.7 95.5  PLT 193 153 160   BMP &GFR Recent Labs  Lab 07/27/21 0855 07/27/21 1820 07/28/21 0423 07/29/21 0601  NA 138  --  138 137  K 3.4*  --  3.4* 4.5  CL 98  --  103 104  CO2 29  --  29 24  GLUCOSE 114*  --  94 83  BUN 34*  --  31* 31*  CREATININE 2.13*  --  1.71* 1.71*  CALCIUM 9.2  --  8.8* 8.9  MG  --  2.1 2.1 2.1  PHOS  --   --   --  3.5   Estimated Creatinine Clearance: 30.2 mL/min (A) (by C-G formula based on SCr of 1.71 mg/dL (H)). Liver & Pancreas: Recent Labs  Lab 07/28/21 0423 07/29/21 0601  AST 24  --    ALT 20  --   ALKPHOS 65  --   BILITOT 0.9  --   PROT 6.5  --   ALBUMIN 3.8 3.4*   No results for input(s): LIPASE, AMYLASE in the last 168 hours. No results for input(s): AMMONIA in the last 168 hours. Diabetic: No results for input(s): HGBA1C in the last 72 hours. No results for input(s): GLUCAP in the last 168 hours. Cardiac Enzymes: Recent Labs  Lab 07/29/21 0601  CKTOTAL 140   No results for input(s): PROBNP in the last 8760 hours. Coagulation Profile: No results for input(s): INR, PROTIME in the last 168 hours. Thyroid Function Tests: No results for input(s): TSH, T4TOTAL, FREET4, T3FREE, THYROIDAB in the last 72 hours. Lipid Profile: No results for input(s): CHOL, HDL, LDLCALC, TRIG, CHOLHDL, LDLDIRECT in the last 72 hours. Anemia Panel: Recent Labs    07/29/21 0601  VITAMINB12 395  FOLATE 30.0  FERRITIN 128  TIBC 298  IRON 58  RETICCTPCT 1.5   Urine analysis:    Component Value Date/Time   COLORURINE YELLOW (A) 07/27/2021 1827   APPEARANCEUR HAZY (A) 07/27/2021 1827   LABSPEC 1.014 07/27/2021 1827   PHURINE 5.0 07/27/2021 1827   GLUCOSEU NEGATIVE 07/27/2021 1827   HGBUR NEGATIVE 07/27/2021 1827   BILIRUBINUR NEGATIVE 07/27/2021 1827   KETONESUR NEGATIVE 07/27/2021 1827   PROTEINUR NEGATIVE 07/27/2021 1827   NITRITE NEGATIVE 07/27/2021 1827   LEUKOCYTESUR NEGATIVE 07/27/2021 1827   Sepsis Labs: Invalid input(s): PROCALCITONIN, LACTICIDVEN   Time coordinating discharge: 45 minutes  SIGNED:  Mercy Riding, MD  [facility] 07/29/2021, 9:10 PM

## 2021-07-29 NOTE — Progress Notes (Signed)
PIV removed. Discharge instructions completed. Patient verbalized understanding of medication regimen, follow up appointments and discharge instructions. Patient belongings gathered and packed up to discharge. Wheeled patient to DC area and discharged to car with son.

## 2021-07-29 NOTE — Plan of Care (Signed)
  Problem: Education: Goal: Knowledge of condition and prescribed therapy will improve Outcome: Progressing   Problem: Cardiac: Goal: Will achieve and/or maintain adequate cardiac output Outcome: Progressing   

## 2021-07-30 LAB — METHYLMALONIC ACID, SERUM: Methylmalonic Acid, Quantitative: 294 nmol/L (ref 0–378)

## 2021-07-31 LAB — ECHOCARDIOGRAM COMPLETE
AR max vel: 1.78 cm2
AV Area VTI: 1.67 cm2
AV Area mean vel: 1.65 cm2
AV Mean grad: 10 mmHg
AV Peak grad: 16.8 mmHg
Ao pk vel: 2.05 m/s
Area-P 1/2: 5.97 cm2
Height: 65 in
MV VTI: 3.58 cm2
S' Lateral: 2.71 cm
Weight: 2846.4 oz

## 2021-08-08 DIAGNOSIS — M109 Gout, unspecified: Secondary | ICD-10-CM | POA: Diagnosis not present

## 2021-08-08 DIAGNOSIS — G4733 Obstructive sleep apnea (adult) (pediatric): Secondary | ICD-10-CM | POA: Diagnosis not present

## 2021-08-08 DIAGNOSIS — E782 Mixed hyperlipidemia: Secondary | ICD-10-CM | POA: Diagnosis not present

## 2021-08-08 DIAGNOSIS — I10 Essential (primary) hypertension: Secondary | ICD-10-CM | POA: Diagnosis not present

## 2021-08-25 DIAGNOSIS — I10 Essential (primary) hypertension: Secondary | ICD-10-CM | POA: Diagnosis not present

## 2021-08-25 DIAGNOSIS — E782 Mixed hyperlipidemia: Secondary | ICD-10-CM | POA: Diagnosis not present

## 2021-08-25 DIAGNOSIS — I340 Nonrheumatic mitral (valve) insufficiency: Secondary | ICD-10-CM | POA: Diagnosis not present

## 2021-08-25 DIAGNOSIS — I119 Hypertensive heart disease without heart failure: Secondary | ICD-10-CM | POA: Diagnosis not present

## 2021-09-08 DIAGNOSIS — I340 Nonrheumatic mitral (valve) insufficiency: Secondary | ICD-10-CM | POA: Diagnosis not present

## 2021-09-08 DIAGNOSIS — E782 Mixed hyperlipidemia: Secondary | ICD-10-CM | POA: Diagnosis not present

## 2021-09-08 DIAGNOSIS — I10 Essential (primary) hypertension: Secondary | ICD-10-CM | POA: Diagnosis not present

## 2021-09-08 DIAGNOSIS — I119 Hypertensive heart disease without heart failure: Secondary | ICD-10-CM | POA: Diagnosis not present

## 2021-09-11 DIAGNOSIS — N2581 Secondary hyperparathyroidism of renal origin: Secondary | ICD-10-CM | POA: Diagnosis not present

## 2021-09-11 DIAGNOSIS — I129 Hypertensive chronic kidney disease with stage 1 through stage 4 chronic kidney disease, or unspecified chronic kidney disease: Secondary | ICD-10-CM | POA: Diagnosis not present

## 2021-09-11 DIAGNOSIS — D631 Anemia in chronic kidney disease: Secondary | ICD-10-CM | POA: Diagnosis not present

## 2021-09-11 DIAGNOSIS — N1832 Chronic kidney disease, stage 3b: Secondary | ICD-10-CM | POA: Diagnosis not present

## 2021-09-11 DIAGNOSIS — E876 Hypokalemia: Secondary | ICD-10-CM | POA: Diagnosis not present

## 2021-11-07 DIAGNOSIS — M109 Gout, unspecified: Secondary | ICD-10-CM | POA: Diagnosis not present

## 2021-11-07 DIAGNOSIS — I2510 Atherosclerotic heart disease of native coronary artery without angina pectoris: Secondary | ICD-10-CM | POA: Diagnosis not present

## 2021-11-07 DIAGNOSIS — I10 Essential (primary) hypertension: Secondary | ICD-10-CM | POA: Diagnosis not present

## 2021-11-07 DIAGNOSIS — E782 Mixed hyperlipidemia: Secondary | ICD-10-CM | POA: Diagnosis not present

## 2021-12-07 DIAGNOSIS — E782 Mixed hyperlipidemia: Secondary | ICD-10-CM | POA: Diagnosis not present

## 2021-12-07 DIAGNOSIS — I119 Hypertensive heart disease without heart failure: Secondary | ICD-10-CM | POA: Diagnosis not present

## 2021-12-07 DIAGNOSIS — I340 Nonrheumatic mitral (valve) insufficiency: Secondary | ICD-10-CM | POA: Diagnosis not present

## 2021-12-07 DIAGNOSIS — I10 Essential (primary) hypertension: Secondary | ICD-10-CM | POA: Diagnosis not present

## 2021-12-11 DIAGNOSIS — I129 Hypertensive chronic kidney disease with stage 1 through stage 4 chronic kidney disease, or unspecified chronic kidney disease: Secondary | ICD-10-CM | POA: Diagnosis not present

## 2021-12-11 DIAGNOSIS — N2581 Secondary hyperparathyroidism of renal origin: Secondary | ICD-10-CM | POA: Diagnosis not present

## 2021-12-11 DIAGNOSIS — M1000 Idiopathic gout, unspecified site: Secondary | ICD-10-CM | POA: Diagnosis not present

## 2021-12-11 DIAGNOSIS — D631 Anemia in chronic kidney disease: Secondary | ICD-10-CM | POA: Diagnosis not present

## 2021-12-11 DIAGNOSIS — N1832 Chronic kidney disease, stage 3b: Secondary | ICD-10-CM | POA: Diagnosis not present

## 2021-12-11 DIAGNOSIS — E876 Hypokalemia: Secondary | ICD-10-CM | POA: Diagnosis not present

## 2022-01-08 DIAGNOSIS — E782 Mixed hyperlipidemia: Secondary | ICD-10-CM | POA: Diagnosis not present

## 2022-01-08 DIAGNOSIS — I10 Essential (primary) hypertension: Secondary | ICD-10-CM | POA: Diagnosis not present

## 2022-01-08 DIAGNOSIS — I340 Nonrheumatic mitral (valve) insufficiency: Secondary | ICD-10-CM | POA: Diagnosis not present

## 2022-01-08 DIAGNOSIS — I119 Hypertensive heart disease without heart failure: Secondary | ICD-10-CM | POA: Diagnosis not present

## 2022-02-09 DIAGNOSIS — H43813 Vitreous degeneration, bilateral: Secondary | ICD-10-CM | POA: Diagnosis not present

## 2022-02-15 DIAGNOSIS — I10 Essential (primary) hypertension: Secondary | ICD-10-CM | POA: Diagnosis not present

## 2022-02-15 DIAGNOSIS — M109 Gout, unspecified: Secondary | ICD-10-CM | POA: Diagnosis not present

## 2022-02-15 DIAGNOSIS — E782 Mixed hyperlipidemia: Secondary | ICD-10-CM | POA: Diagnosis not present

## 2022-04-11 DIAGNOSIS — E782 Mixed hyperlipidemia: Secondary | ICD-10-CM | POA: Diagnosis not present

## 2022-04-11 DIAGNOSIS — I129 Hypertensive chronic kidney disease with stage 1 through stage 4 chronic kidney disease, or unspecified chronic kidney disease: Secondary | ICD-10-CM | POA: Diagnosis not present

## 2022-04-11 DIAGNOSIS — N1832 Chronic kidney disease, stage 3b: Secondary | ICD-10-CM | POA: Diagnosis not present

## 2022-06-04 DIAGNOSIS — M1000 Idiopathic gout, unspecified site: Secondary | ICD-10-CM | POA: Diagnosis not present

## 2022-06-04 DIAGNOSIS — I129 Hypertensive chronic kidney disease with stage 1 through stage 4 chronic kidney disease, or unspecified chronic kidney disease: Secondary | ICD-10-CM | POA: Diagnosis not present

## 2022-06-04 DIAGNOSIS — N1832 Chronic kidney disease, stage 3b: Secondary | ICD-10-CM | POA: Diagnosis not present

## 2022-06-04 DIAGNOSIS — N2581 Secondary hyperparathyroidism of renal origin: Secondary | ICD-10-CM | POA: Diagnosis not present

## 2022-06-04 DIAGNOSIS — E876 Hypokalemia: Secondary | ICD-10-CM | POA: Diagnosis not present

## 2022-06-11 DIAGNOSIS — I129 Hypertensive chronic kidney disease with stage 1 through stage 4 chronic kidney disease, or unspecified chronic kidney disease: Secondary | ICD-10-CM | POA: Diagnosis not present

## 2022-06-11 DIAGNOSIS — N1832 Chronic kidney disease, stage 3b: Secondary | ICD-10-CM | POA: Diagnosis not present

## 2022-06-11 DIAGNOSIS — E782 Mixed hyperlipidemia: Secondary | ICD-10-CM | POA: Diagnosis not present

## 2022-06-12 DIAGNOSIS — I129 Hypertensive chronic kidney disease with stage 1 through stage 4 chronic kidney disease, or unspecified chronic kidney disease: Secondary | ICD-10-CM | POA: Diagnosis not present

## 2022-06-12 DIAGNOSIS — N1832 Chronic kidney disease, stage 3b: Secondary | ICD-10-CM | POA: Diagnosis not present

## 2022-06-12 DIAGNOSIS — R600 Localized edema: Secondary | ICD-10-CM | POA: Diagnosis not present

## 2022-06-12 DIAGNOSIS — N2581 Secondary hyperparathyroidism of renal origin: Secondary | ICD-10-CM | POA: Diagnosis not present

## 2022-06-18 ENCOUNTER — Other Ambulatory Visit: Payer: Self-pay | Admitting: Internal Medicine

## 2022-06-18 DIAGNOSIS — M8589 Other specified disorders of bone density and structure, multiple sites: Secondary | ICD-10-CM | POA: Diagnosis not present

## 2022-06-18 DIAGNOSIS — M109 Gout, unspecified: Secondary | ICD-10-CM | POA: Diagnosis not present

## 2022-06-18 DIAGNOSIS — I10 Essential (primary) hypertension: Secondary | ICD-10-CM | POA: Diagnosis not present

## 2022-06-18 DIAGNOSIS — R5383 Other fatigue: Secondary | ICD-10-CM | POA: Diagnosis not present

## 2022-06-18 DIAGNOSIS — E782 Mixed hyperlipidemia: Secondary | ICD-10-CM | POA: Diagnosis not present

## 2022-06-18 DIAGNOSIS — I2510 Atherosclerotic heart disease of native coronary artery without angina pectoris: Secondary | ICD-10-CM | POA: Diagnosis not present

## 2022-06-18 DIAGNOSIS — I509 Heart failure, unspecified: Secondary | ICD-10-CM | POA: Diagnosis not present

## 2022-06-18 DIAGNOSIS — Z1231 Encounter for screening mammogram for malignant neoplasm of breast: Secondary | ICD-10-CM

## 2022-07-10 ENCOUNTER — Ambulatory Visit
Admission: RE | Admit: 2022-07-10 | Discharge: 2022-07-10 | Disposition: A | Discharge status: Home / Self Care | Payer: Medicare Other | Source: Ambulatory Visit | Attending: Internal Medicine | Admitting: Internal Medicine

## 2022-07-10 DIAGNOSIS — Z1231 Encounter for screening mammogram for malignant neoplasm of breast: Secondary | ICD-10-CM | POA: Insufficient documentation

## 2022-07-20 ENCOUNTER — Telehealth: Payer: Self-pay | Admitting: *Deleted

## 2022-07-20 NOTE — Patient Outreach (Signed)
  Care Coordination   Initial Visit Note   07/20/2022 Name: Elaine Norris MRN: [id number] DOB: [birth year]  Elaine Norris is a 76 y.o. year old female who sees Perrin Maltese, MD for primary care. I spoke with  Hilliard Clark by phone today. Contact was made with the patient today to offer care coordination services as a benefit of their health plan. Patient declined services.  What matters to the patients health and wellness today?  No concerns expressed.    Goals Addressed             This Visit's Progress    COMPLETED: Patient Stated       Advised patient to contact PCP office to schedule an AWV.        SDOH assessments and interventions completed:  Yes    Care Coordination Interventions Activated:  Yes  Care Coordination Interventions:  Yes, provided   Follow up plan: No further intervention required.   Encounter Outcome:  Pt. Refused   Emelia Loron RN, BSN [place] [phone] [email or web address]

## 2022-08-23 DIAGNOSIS — Z23 Encounter for immunization: Secondary | ICD-10-CM | POA: Diagnosis not present

## 2022-10-11 ENCOUNTER — Emergency Department
Admission: EM | Admit: 2022-10-11 | Discharge: 2022-10-11 | Disposition: A | Discharge status: Home / Self Care | Payer: Medicare Other | Attending: Emergency Medicine | Admitting: Emergency Medicine

## 2022-10-11 ENCOUNTER — Emergency Department: Payer: Medicare Other

## 2022-10-11 ENCOUNTER — Other Ambulatory Visit: Payer: Self-pay

## 2022-10-11 ENCOUNTER — Encounter: Payer: Self-pay | Admitting: Emergency Medicine

## 2022-10-11 DIAGNOSIS — J209 Acute bronchitis, unspecified: Secondary | ICD-10-CM | POA: Diagnosis not present

## 2022-10-11 DIAGNOSIS — I10 Essential (primary) hypertension: Secondary | ICD-10-CM | POA: Diagnosis not present

## 2022-10-11 DIAGNOSIS — Z743 Need for continuous supervision: Secondary | ICD-10-CM | POA: Diagnosis not present

## 2022-10-11 DIAGNOSIS — J40 Bronchitis, not specified as acute or chronic: Secondary | ICD-10-CM | POA: Diagnosis not present

## 2022-10-11 DIAGNOSIS — R9431 Abnormal electrocardiogram [ECG] [EKG]: Secondary | ICD-10-CM | POA: Diagnosis not present

## 2022-10-11 DIAGNOSIS — R6889 Other general symptoms and signs: Secondary | ICD-10-CM | POA: Diagnosis not present

## 2022-10-11 DIAGNOSIS — R059 Cough, unspecified: Secondary | ICD-10-CM | POA: Diagnosis not present

## 2022-10-11 DIAGNOSIS — R42 Dizziness and giddiness: Secondary | ICD-10-CM | POA: Diagnosis not present

## 2022-10-11 LAB — BASIC METABOLIC PANEL
Anion gap: 9 (ref 5–15)
BUN: 15 mg/dL (ref 8–23)
CO2: 24 mmol/L (ref 22–32)
Calcium: 9.1 mg/dL (ref 8.9–10.3)
Chloride: 107 mmol/L (ref 98–111)
Creatinine, Ser: 1.48 mg/dL — ABNORMAL HIGH (ref 0.44–1.00)
GFR, Estimated: 36 mL/min — ABNORMAL LOW (ref >60)
Glucose, Bld: 114 mg/dL — ABNORMAL HIGH (ref 70–99)
Potassium: 3.4 mmol/L — ABNORMAL LOW (ref 3.5–5.1)
Sodium: 140 mmol/L (ref 135–145)

## 2022-10-11 LAB — URINALYSIS, ROUTINE W REFLEX MICROSCOPIC
Bacteria, UA: NONE SEEN
Bilirubin Urine: NEGATIVE
Glucose, UA: >=500 mg/dL — AB
Hgb urine dipstick: NEGATIVE
Ketones, ur: NEGATIVE mg/dL
Leukocytes,Ua: NEGATIVE
Nitrite: NEGATIVE
Protein, ur: NEGATIVE mg/dL
Specific Gravity, Urine: 1.001 — ABNORMAL LOW (ref 1.005–1.030)
pH: 7 (ref 5.0–8.0)

## 2022-10-11 LAB — CBC
HCT: 36.7 % (ref 36.0–46.0)
Hemoglobin: 12 g/dL (ref 12.0–15.0)
MCH: 29.9 pg (ref 26.0–34.0)
MCHC: 32.7 g/dL (ref 30.0–36.0)
MCV: 91.5 fL (ref 80.0–100.0)
Platelets: 203 10*3/uL (ref 150–400)
RBC: 4.01 MIL/uL (ref 3.87–5.11)
RDW: 12.6 % (ref 11.5–15.5)
WBC: 6.7 10*3/uL (ref 4.0–10.5)
nRBC: 0 % (ref 0.0–0.2)

## 2022-10-11 MED ORDER — PREDNISONE 20 MG PO TABS
60.0000 mg | ORAL_TABLET | Freq: Once | ORAL | Status: AC
  Administered 2022-10-11: 60 mg via ORAL
  Filled 2022-10-11: qty 3

## 2022-10-11 MED ORDER — PREDNISONE 10 MG PO TABS: 10.0000 mg | ORAL_TABLET | Freq: Every day | ORAL | 0 refills | Status: DC

## 2022-10-11 MED ORDER — DOXYCYCLINE HYCLATE 100 MG PO TABS
100.0000 mg | ORAL_TABLET | Freq: Once | ORAL | Status: AC
  Administered 2022-10-11: 100 mg via ORAL
  Filled 2022-10-11: qty 1

## 2022-10-11 MED ORDER — DOXYCYCLINE HYCLATE 100 MG PO TABS: 100.0000 mg | ORAL_TABLET | Freq: Two times a day (BID) | ORAL | 0 refills | Status: DC

## 2022-10-11 NOTE — ED Provider Notes (Signed)
[facility] Provider Note    Event Date/Time   First MD Initiated Contact with Patient 10/11/22 2001     (approximate)  History   Chief Complaint: Hypertension and Cough  HPI  Elaine Norris is a 76 y.o. female with a past medical history of anxiety, depression, gastric reflux, hypertension, hyperlipidemia, MI, presents emergency department for 3 weeks of cough.  According to the patient for the past 3 weeks she has had a cough and nasal congestion.  States that time she has noticed some blood streaks within the sputum.  Denies any fever.  Patient states for the last day or 2 she has not been able to take her blood pressure medications due to the cough.  Patient denies any chest pain.  Denies any shortness of breath.  Currently satting 99% on room air and is afebrile.  Physical Exam   Triage Vital Signs: ED Triage Vitals  Enc Vitals Group     BP 10/11/22 1355 (!) 203/99     Pulse Rate 10/11/22 1355 70     Resp 10/11/22 1355 16     Temp 10/11/22 1355 97.9 F (36.6 C)     Temp Source 10/11/22 1355 Oral     SpO2 10/11/22 1355 98 %     Weight 10/11/22 1358 183 lb (83 kg)     Height 10/11/22 1358 '5\' 5"'$  (1.651 m)     Head Circumference --      Peak Flow --      Pain Score 10/11/22 1356 4     Pain Loc --      Pain Edu? --      Excl. in [place]? --     Most recent vital signs: Vitals:   10/11/22 1355 10/11/22 1809  BP: (!) 203/99 (S) (!) 163/104  Pulse: 70 63  Resp: 16 16  Temp: 97.9 F (36.6 C) 98 F (36.7 C)  SpO2: 98% 99%    General: Awake, no distress.  CV:  Good peripheral perfusion.  Regular rate and rhythm  Resp:  Normal effort.  Equal breath sounds bilaterally.  Abd:  No distention.  Soft, nontender.  No rebound or guarding. Other:  Vaginal cough during evaluation.   ED Results / Procedures / Treatments   EKG  EKG viewed and interpreted by myself shows a normal sinus rhythm at 66 bpm with a narrow QRS, normal axis, normal intervals  besides slight QTc prolongation, no concerning ST changes.  RADIOLOGY  I have reviewed and interpreted chest x-ray images.  No consolidation seen on my evaluation. Radiology has read the x-ray as negative   MEDICATIONS ORDERED IN ED: Medications  predniSONE (DELTASONE) tablet 60 mg (has no administration in time range)  doxycycline (VIBRA-TABS) tablet 100 mg (has no administration in time range)     IMPRESSION / MDM / ASSESSMENT AND PLAN / ED COURSE  I reviewed the triage vital signs and the nursing notes.  Patient's presentation is most consistent with acute presentation with potential threat to life or bodily function.  Patient presents emergency department with 3 weeks of cough and runny nose/congestion.  Overall the patient appears well, no distress she is hypertensive 163/104 but states she did not take any of her blood pressure medications today and did not take any last night.  Patient denies any chest pain.  Lab work is reassuring including a reassuring chemistry, normal CBC.  EKG shows no concerning findings.  Given 3 weeks of cough we will obtain a  chest x-ray.  Patient's lungs sound clear on examination.  Given 3 weeks of cough highly suspect acute bronchitis.  If the chest x-ray is negative we will cover with prednisone and doxycycline given the patient's prolonged QTc history.  Patient agreeable to plan of care.  Discussed with patient the importance of taking her blood pressure medications.  Patient will resume blood pressure medications tonight when she gets home and will follow-up with her doctor in 2 to 3 days for recheck.  Chest x-ray is negative.  We will discharge on prednisone and doxycycline patient will follow up with her doctor.  Provided my typical return precautions.  FINAL CLINICAL IMPRESSION(S) / ED DIAGNOSES   Acute bronchitis   Note:  This document was prepared using Dragon voice recognition software and may include unintentional dictation errors.    Harvest Dark, MD 10/11/22 2058

## 2022-10-11 NOTE — ED Triage Notes (Signed)
First Nurse Note:  Arrives from home via [facility].  C/O dizziness x 3 weeks.  Today, patient has HTN.  Per report patient unable to take meds today due to coughing too much.  163 /132 62 99% RA

## 2022-10-11 NOTE — ED Triage Notes (Signed)
Patient arrives in wheelchair to triage c/o cold symptoms x 3 weeks. Reports productive cough and lots of sleeping. States BP is usually high before taking meds but unable to take them today due to coughing spell. C/o headache and neck pain.

## 2022-10-30 DIAGNOSIS — E782 Mixed hyperlipidemia: Secondary | ICD-10-CM | POA: Diagnosis not present

## 2022-10-30 DIAGNOSIS — K219 Gastro-esophageal reflux disease without esophagitis: Secondary | ICD-10-CM | POA: Diagnosis not present

## 2022-10-30 DIAGNOSIS — M109 Gout, unspecified: Secondary | ICD-10-CM | POA: Diagnosis not present

## 2022-10-30 DIAGNOSIS — Z0000 Encounter for general adult medical examination without abnormal findings: Secondary | ICD-10-CM | POA: Diagnosis not present

## 2022-10-30 DIAGNOSIS — R29890 Loss of height: Secondary | ICD-10-CM | POA: Diagnosis not present

## 2022-10-30 DIAGNOSIS — I10 Essential (primary) hypertension: Secondary | ICD-10-CM | POA: Diagnosis not present

## 2022-10-30 DIAGNOSIS — M8589 Other specified disorders of bone density and structure, multiple sites: Secondary | ICD-10-CM | POA: Diagnosis not present

## 2022-10-30 LAB — HM DEXA SCAN: HM Dexa Scan: NORMAL

## 2022-11-10 DIAGNOSIS — I129 Hypertensive chronic kidney disease with stage 1 through stage 4 chronic kidney disease, or unspecified chronic kidney disease: Secondary | ICD-10-CM | POA: Diagnosis not present

## 2022-11-10 DIAGNOSIS — N1832 Chronic kidney disease, stage 3b: Secondary | ICD-10-CM | POA: Diagnosis not present

## 2022-11-10 DIAGNOSIS — E782 Mixed hyperlipidemia: Secondary | ICD-10-CM | POA: Diagnosis not present

## 2022-11-14 DIAGNOSIS — I10 Essential (primary) hypertension: Secondary | ICD-10-CM | POA: Diagnosis not present

## 2022-11-14 DIAGNOSIS — E782 Mixed hyperlipidemia: Secondary | ICD-10-CM | POA: Diagnosis not present

## 2022-12-11 DIAGNOSIS — N1832 Chronic kidney disease, stage 3b: Secondary | ICD-10-CM | POA: Diagnosis not present

## 2022-12-11 DIAGNOSIS — E782 Mixed hyperlipidemia: Secondary | ICD-10-CM | POA: Diagnosis not present

## 2022-12-11 DIAGNOSIS — I129 Hypertensive chronic kidney disease with stage 1 through stage 4 chronic kidney disease, or unspecified chronic kidney disease: Secondary | ICD-10-CM | POA: Diagnosis not present

## 2022-12-16 ENCOUNTER — Other Ambulatory Visit: Payer: Self-pay | Admitting: Cardiovascular Disease

## 2022-12-17 DIAGNOSIS — Z681 Body mass index (BMI) 19 or less, adult: Secondary | ICD-10-CM | POA: Diagnosis not present

## 2022-12-17 DIAGNOSIS — E113521 Type 2 diabetes mellitus with proliferative diabetic retinopathy with traction retinal detachment involving the macula, right eye: Secondary | ICD-10-CM | POA: Diagnosis not present

## 2022-12-17 DIAGNOSIS — M810 Age-related osteoporosis without current pathological fracture: Secondary | ICD-10-CM | POA: Diagnosis not present

## 2022-12-17 DIAGNOSIS — E1165 Type 2 diabetes mellitus with hyperglycemia: Secondary | ICD-10-CM | POA: Diagnosis not present

## 2022-12-17 DIAGNOSIS — I10 Essential (primary) hypertension: Secondary | ICD-10-CM | POA: Diagnosis not present

## 2022-12-17 DIAGNOSIS — E049 Nontoxic goiter, unspecified: Secondary | ICD-10-CM | POA: Diagnosis not present

## 2022-12-17 DIAGNOSIS — N184 Chronic kidney disease, stage 4 (severe): Secondary | ICD-10-CM | POA: Diagnosis not present

## 2022-12-17 DIAGNOSIS — E782 Mixed hyperlipidemia: Secondary | ICD-10-CM | POA: Diagnosis not present

## 2023-01-11 ENCOUNTER — Telehealth: Payer: Self-pay

## 2023-01-11 NOTE — Telephone Encounter (Signed)
General Review Call  Have there been any documented new, changed, or discontinued medications since last visit?  (Include name, dose, frequency, date): None. Has there been any documented recent hospitalizations or ED visits since last visit with Clinical Lead?: No Adherence Review Does the [facility] have access to medication refill data?: Yes Adherence rates for STAR metric medications: Atorvastatin 40 mg - 11/08/22 90 DS Losartan 100 mg - 12/17/22 90 DS Farxiga 10 mg - 11/16/22 30 DS Adherence rates for medications indicated for disease state being reviewed: Atorvastatin 40 mg - 11/08/22 90 DS Losartan 100 mg - 12/17/22 90 DS Farxiga 10 mg - 11/16/22 30 DS Does the patient have >5 day gap between last estimated fill dates for any of the above medications or other medication gaps?: No Disease State Questions Able to connect with the Patient?: Yes Did patient have any problems with their health recently?: No Have you had any admissions or emergency room visits or worsening of your condition(s) since last visit?: No Have you had any visits with new specialists or providers since your last visit?: No Have you had any new health care problem(s) since your last visit?: No Have you run out of any of your medications since you last spoke with your clinical lead?: No Are there any medications you are not taking as prescribed?: No Are you having any issues or side effects with your medications?: No Do you have any other health concerns or questions you want to discuss with your Clinical lead  before your next visit?: No Are there any health concerns that you feel we can do a better job addressing?: No Any falls since last visit?: No Any increased or uncontrolled pain since last visit?: No Next visit Type:: Phone Visit with:: CP Date:: 07/08/2023 Time:: 3:00 PM Engagement Notes Charlann Lange on 01/11/2023 11:12 AM HC Chart Review: 10 min 01/11/23 HC Assessment call time spent: 5 min 01/11/23  Clinical  Lead Review Adherence gaps identified?: No Drug Therapy Problems identified?: No Assessment: No new needs identified. Follow-up as scheduled Jerral Ralph, PharmD Review 43mns 01/11/23

## 2023-01-14 DIAGNOSIS — N2581 Secondary hyperparathyroidism of renal origin: Secondary | ICD-10-CM | POA: Diagnosis not present

## 2023-01-14 DIAGNOSIS — N1832 Chronic kidney disease, stage 3b: Secondary | ICD-10-CM | POA: Diagnosis not present

## 2023-01-14 DIAGNOSIS — I10 Essential (primary) hypertension: Secondary | ICD-10-CM | POA: Diagnosis not present

## 2023-01-17 DIAGNOSIS — N184 Chronic kidney disease, stage 4 (severe): Secondary | ICD-10-CM | POA: Diagnosis not present

## 2023-01-17 DIAGNOSIS — M810 Age-related osteoporosis without current pathological fracture: Secondary | ICD-10-CM | POA: Diagnosis not present

## 2023-01-17 DIAGNOSIS — E782 Mixed hyperlipidemia: Secondary | ICD-10-CM | POA: Diagnosis not present

## 2023-01-17 DIAGNOSIS — E1165 Type 2 diabetes mellitus with hyperglycemia: Secondary | ICD-10-CM | POA: Diagnosis not present

## 2023-01-17 DIAGNOSIS — I10 Essential (primary) hypertension: Secondary | ICD-10-CM | POA: Diagnosis not present

## 2023-01-17 DIAGNOSIS — E049 Nontoxic goiter, unspecified: Secondary | ICD-10-CM | POA: Diagnosis not present

## 2023-01-24 DIAGNOSIS — N2581 Secondary hyperparathyroidism of renal origin: Secondary | ICD-10-CM | POA: Diagnosis not present

## 2023-01-24 DIAGNOSIS — I10 Essential (primary) hypertension: Secondary | ICD-10-CM | POA: Diagnosis not present

## 2023-01-24 DIAGNOSIS — N1832 Chronic kidney disease, stage 3b: Secondary | ICD-10-CM | POA: Diagnosis not present

## 2023-01-24 DIAGNOSIS — I129 Hypertensive chronic kidney disease with stage 1 through stage 4 chronic kidney disease, or unspecified chronic kidney disease: Secondary | ICD-10-CM | POA: Diagnosis not present

## 2023-01-24 DIAGNOSIS — R600 Localized edema: Secondary | ICD-10-CM | POA: Diagnosis not present

## 2023-02-06 ENCOUNTER — Other Ambulatory Visit: Payer: Self-pay | Admitting: Cardiovascular Disease

## 2023-02-12 DIAGNOSIS — Z961 Presence of intraocular lens: Secondary | ICD-10-CM | POA: Diagnosis not present

## 2023-02-12 DIAGNOSIS — H04123 Dry eye syndrome of bilateral lacrimal glands: Secondary | ICD-10-CM | POA: Diagnosis not present

## 2023-02-12 DIAGNOSIS — H43812 Vitreous degeneration, left eye: Secondary | ICD-10-CM | POA: Diagnosis not present

## 2023-02-18 ENCOUNTER — Ambulatory Visit: Payer: Medicare Other | Admitting: Cardiovascular Disease

## 2023-03-01 ENCOUNTER — Encounter: Payer: Self-pay | Admitting: Internal Medicine

## 2023-03-01 ENCOUNTER — Ambulatory Visit: Payer: Medicare Other | Admitting: Internal Medicine

## 2023-03-01 ENCOUNTER — Ambulatory Visit (INDEPENDENT_AMBULATORY_CARE_PROVIDER_SITE_OTHER): Payer: Medicare Other | Admitting: Internal Medicine

## 2023-03-01 VITALS — BP 108/62 | HR 69 | Ht 65.0 in | Wt 171.6 lb

## 2023-03-01 DIAGNOSIS — M1000 Idiopathic gout, unspecified site: Secondary | ICD-10-CM

## 2023-03-01 DIAGNOSIS — I340 Nonrheumatic mitral (valve) insufficiency: Secondary | ICD-10-CM

## 2023-03-01 DIAGNOSIS — I10 Essential (primary) hypertension: Secondary | ICD-10-CM

## 2023-03-01 DIAGNOSIS — R42 Dizziness and giddiness: Secondary | ICD-10-CM

## 2023-03-01 DIAGNOSIS — K2100 Gastro-esophageal reflux disease with esophagitis, without bleeding: Secondary | ICD-10-CM | POA: Diagnosis not present

## 2023-03-01 DIAGNOSIS — E782 Mixed hyperlipidemia: Secondary | ICD-10-CM

## 2023-03-01 NOTE — Progress Notes (Signed)
Established Patient Office Visit  Subjective:  Patient ID: Elaine Norris, female    DOB: [birth year]  Age: 77 y.o. MRN: [id number]  Chief Complaint  Patient presents with   Follow-up    4 month follow up    Patient comes in for her 52-month follow-up today.  She reports of some dizziness especially when she stands up from a sitting position.  Her blood pressure is slightly low today.  Patient has lost some weight and may need her blood pressure medications to be adjusted. She denies any headache or visual blurring, no chest pain and no shortness of breath. She needs blood work today. She has a history of moderate mitral regurgitation, she needs a new echocardiogram for follow-up.  Will also need a cardiology follow-up after echo.  Patient advised to monitor her blood pressure at home.  Will further adjust her medications as noted.    No other concerns at this time.   Past Medical History:  Diagnosis Date   Anxiety    Related to surgery   Arthritis    hands   Cancer 1970's   cervical   Depression    Controlled   Esophagitis    GERD (gastroesophageal reflux disease)    Gout    Hyperlipidemia    Hypertension    Kidney carcinoma 06/2020   Myocardial infarction    Osteoporosis    feet   Pseudoseizure    Sleep apnea    CPAP    Past Surgical History:  Procedure Laterality Date   CARDIAC CATHETERIZATION  05/2011   [facility]   CARDIAC CATHETERIZATION  09/2011   [facility]   CARDIAC CATHETERIZATION  2012   [facility]   CATARACT EXTRACTION W/PHACO Left 02/17/2018   Procedure: CATARACT EXTRACTION PHACO AND INTRAOCULAR LENS PLACEMENT (IOC) LEFT;  Surgeon: Lockie Mola, MD;  Location: [facility];  Service: Ophthalmology;  Laterality: Left;   CATARACT EXTRACTION W/PHACO Right 03/10/2018   Procedure: CATARACT EXTRACTION PHACO AND INTRAOCULAR LENS PLACEMENT (IOC) RIGHT  COMPLICATED;  Surgeon: Lockie Mola, MD;  [facility];  Service: Ophthalmology;   Laterality: Right;  NEEDS EARLY AM 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 malyuhin   CHOLECYSTECTOMY N/A 06/26/2018   Procedure: LAPAROSCOPIC CHOLECYSTECTOMY;  Surgeon: Sheliah Hatch De Blanch, MD;  Location: [facility];  Service: General;  Laterality: N/A;   COLONOSCOPY     COLONOSCOPY WITH PROPOFOL N/A 01/21/2020   Procedure: COLONOSCOPY WITH PROPOFOL;  Surgeon: Toledo, Boykin Nearing, MD;  Location: [facility];  Service: Gastroenterology;  Laterality: N/A;   ESOPHAGOGASTRODUODENOSCOPY (EGD) WITH PROPOFOL N/A 01/21/2020   Procedure: ESOPHAGOGASTRODUODENOSCOPY (EGD) WITH PROPOFOL;  Surgeon: Toledo, Boykin Nearing, MD;  Location: [facility];  Service: Gastroenterology;  Laterality: N/A;   MULTIPLE TOOTH EXTRACTIONS     TONSILLECTOMY     WISDOM TOOTH EXTRACTION      Social History   Socioeconomic History   Marital status: Widowed    Spouse name: Not on file   Number of children: Not on file   Years of education: Not on file   Highest education level: Not on file  Occupational History   Occupation: retied  Tobacco Use   Smoking status: Passive Smoke Exposure - Never Smoker   Smokeless tobacco: Never  Vaping Use   Vaping Use: Never used  Substance and Sexual Activity   Alcohol use: No   Drug use: No   Sexual activity: Not Currently  Other Topics Concern   Not on file  Social History Narrative   Not  on file   Social Determinants of Health   Financial Resource Strain: Low Risk  (06/19/2018)   Overall Financial Resource Strain (CARDIA)    Difficulty of Paying Living Expenses: Not hard at all  Food Insecurity: No Food Insecurity (07/20/2022)   Hunger Vital Sign    Worried About Running Out of Food in the Last Year: Never true    Ran Out of Food in the Last Year: Never true  Transportation Needs: No Transportation Needs (07/20/2022)   PRAPARE - Administrator, Civil Service (Medical): No    Lack of Transportation (Non-Medical): No  Physical Activity: Unknown (06/19/2018)   Exercise  Vital Sign    Days of Exercise per Week: 2 days    Minutes of Exercise per Session: Not on file  Stress: No Stress Concern Present (06/19/2018)   [employer] of Occupational Health - Occupational Stress Questionnaire    Feeling of Stress : Only a little  Social Connections: Moderately Isolated (06/19/2018)   Social Connection and Isolation Panel [NHANES]    Frequency of Communication with Friends and Family: Not on file    Frequency of Social Gatherings with Friends and Family: More than three times a week    Attends Religious Services: Never    Database administrator or Organizations: No    Attends Banker Meetings: Never    Marital Status: Divorced  Catering manager Violence: Not At Risk (06/19/2018)   Humiliation, Afraid, Rape, and Kick questionnaire    Fear of Current or Ex-Partner: No    Emotionally Abused: No    Physically Abused: No    Sexually Abused: No    Family History  Problem Relation Age of Onset   Heart disease Mother    Heart failure Father    Breast cancer Neg Hx     Allergies  Allergen Reactions   Fire Ant Anaphylaxis   Gabapentin     Muscles jerks, weakness and difficulty swallowing - Noted in hospital stay   Albuterol Other (See Comments)    Pt reports seizures    Codeine Other (See Comments)    Altered mental status     Review of Systems  Constitutional: Negative.  Negative for chills.  HENT: Negative.    Eyes: Negative.   Respiratory:  Negative for cough, shortness of breath and wheezing.   Cardiovascular:  Negative for chest pain, palpitations, orthopnea, leg swelling and PND.  Gastrointestinal:  Negative for blood in stool, constipation, heartburn and nausea.  Genitourinary: Negative.   Musculoskeletal:  Negative for joint pain, myalgias and neck pain.  Neurological:  Positive for dizziness and seizures. Negative for tingling, tremors, sensory change, speech change, focal weakness, weakness and headaches.   Psychiatric/Behavioral:  Negative for depression. The patient is not nervous/anxious and does not have insomnia.        Objective:   BP 108/62   Pulse 69   Ht  (1.651 m)   Wt 171 lb 9.6 oz (77.8 kg)   SpO2 98%   BMI 28.56 kg/m   Vitals:   03/01/23 1130  BP: 108/62  Pulse: 69  Height:  (1.651 m)  Weight: 171 lb 9.6 oz (77.8 kg)  SpO2: 98%  BMI (Calculated): 28.56    Physical Exam Vitals and nursing note reviewed.  Constitutional:      Appearance: Normal appearance.  Cardiovascular:     Rate and Rhythm: Normal rate and regular rhythm.     Pulses: Normal pulses.  Heart sounds: Murmur heard.  Pulmonary:     Effort: Pulmonary effort is normal.     Breath sounds: Normal breath sounds.  Abdominal:     Palpations: Abdomen is soft.  Musculoskeletal:        General: Normal range of motion.     Cervical back: Normal range of motion and neck supple.  Skin:    General: Skin is warm.  Neurological:     General: No focal deficit present.     Mental Status: She is alert and oriented to person, place, and time.     Motor: No weakness.     Coordination: Coordination normal.  Psychiatric:        Mood and Affect: Mood normal.        Behavior: Behavior normal.      No results found for any visits on 03/01/23.  No results found for this or any previous visit (from the past 2160 hour(s)).    Assessment & Plan:  Patient to get echocardiogram and carotid Dopplers.  She will follow-up with the cardiologist.  Check blood work today.  Patient advised to monitor blood pressure at home and bring in record.  Will adjust her medications. Problem List Items Addressed This Visit     Essential hypertension, benign   Relevant Orders   CMP14+EGFR   CBC With Differential   Hyperlipidemia   Relevant Orders   Lipid Panel w/o Chol/HDL Ratio   GERD (gastroesophageal reflux disease)   Idiopathic gout   Relevant Orders   CBC With Differential   Uric acid   Nonrheumatic  mitral valve regurgitation - Primary   Relevant Orders   PCV ECHOCARDIOGRAM COMPLETE   Dizziness    Return in about 2 weeks (around 03/15/2023).   Total time spent: 30 minutes  Margaretann Loveless, MD  03/01/2023

## 2023-03-02 LAB — CMP14+EGFR
ALT: 22 IU/L (ref 0–32)
AST: 22 IU/L (ref 0–40)
Albumin/Globulin Ratio: 1.9 (ref 1.2–2.2)
Albumin: 4.3 g/dL (ref 3.8–4.8)
Alkaline Phosphatase: 93 IU/L (ref 44–121)
BUN/Creatinine Ratio: 17 (ref 12–28)
BUN: 37 mg/dL — ABNORMAL HIGH (ref 8–27)
Bilirubin Total: 0.5 mg/dL (ref 0.0–1.2)
CO2: 24 mmol/L (ref 20–29)
Calcium: 9.4 mg/dL (ref 8.7–10.3)
Chloride: 98 mmol/L (ref 96–106)
Creatinine, Ser: 2.19 mg/dL — ABNORMAL HIGH (ref 0.57–1.00)
Globulin, Total: 2.3 g/dL (ref 1.5–4.5)
Glucose: 100 mg/dL — ABNORMAL HIGH (ref 70–99)
Potassium: 3.2 mmol/L — ABNORMAL LOW (ref 3.5–5.2)
Sodium: 139 mmol/L (ref 134–144)
Total Protein: 6.6 g/dL (ref 6.0–8.5)
eGFR: 23 mL/min/{1.73_m2} — ABNORMAL LOW (ref >59)

## 2023-03-02 LAB — CBC WITH DIFFERENTIAL
Basophils Absolute: 0.1 10*3/uL (ref 0.0–0.2)
Basos: 1 %
EOS (ABSOLUTE): 0.1 10*3/uL (ref 0.0–0.4)
Eos: 1 %
Hematocrit: 37.5 % (ref 34.0–46.6)
Hemoglobin: 12.2 g/dL (ref 11.1–15.9)
Immature Grans (Abs): 0 10*3/uL (ref 0.0–0.1)
Immature Granulocytes: 0 %
Lymphocytes Absolute: 1.4 10*3/uL (ref 0.7–3.1)
Lymphs: 14 %
MCH: 29.8 pg (ref 26.6–33.0)
MCHC: 32.5 g/dL (ref 31.5–35.7)
MCV: 92 fL (ref 79–97)
Monocytes Absolute: 0.6 10*3/uL (ref 0.1–0.9)
Monocytes: 6 %
Neutrophils Absolute: 7.9 10*3/uL — ABNORMAL HIGH (ref 1.4–7.0)
Neutrophils: 78 %
RBC: 4.09 x10E6/uL (ref 3.77–5.28)
RDW: 13.3 % (ref 11.7–15.4)
WBC: 10.1 10*3/uL (ref 3.4–10.8)

## 2023-03-02 LAB — LIPID PANEL W/O CHOL/HDL RATIO
Cholesterol, Total: 168 mg/dL (ref 100–199)
HDL: 39 mg/dL — ABNORMAL LOW (ref >39)
LDL Chol Calc (NIH): 93 mg/dL (ref 0–99)
Triglycerides: 209 mg/dL — ABNORMAL HIGH (ref 0–149)
VLDL Cholesterol Cal: 36 mg/dL (ref 5–40)

## 2023-03-02 LAB — URIC ACID: Uric Acid: 7 mg/dL (ref 3.1–7.9)

## 2023-03-04 ENCOUNTER — Ambulatory Visit: Payer: Medicare Other | Admitting: Cardiovascular Disease

## 2023-03-06 ENCOUNTER — Other Ambulatory Visit: Payer: Self-pay

## 2023-03-06 MED ORDER — POTASSIUM CHLORIDE CRYS ER 20 MEQ PO TBCR: 20.0000 meq | EXTENDED_RELEASE_TABLET | Freq: Two times a day (BID) | ORAL | 0 refills | Status: DC

## 2023-03-06 NOTE — Progress Notes (Signed)
Pt informed, rx sent 

## 2023-03-15 ENCOUNTER — Encounter: Payer: Self-pay | Admitting: Internal Medicine

## 2023-03-15 ENCOUNTER — Ambulatory Visit (INDEPENDENT_AMBULATORY_CARE_PROVIDER_SITE_OTHER): Payer: Medicare Other | Admitting: Internal Medicine

## 2023-03-15 VITALS — BP 128/70 | HR 64 | Ht 65.0 in | Wt 174.2 lb

## 2023-03-15 DIAGNOSIS — E782 Mixed hyperlipidemia: Secondary | ICD-10-CM | POA: Diagnosis not present

## 2023-03-15 DIAGNOSIS — N1832 Chronic kidney disease, stage 3b: Secondary | ICD-10-CM | POA: Diagnosis not present

## 2023-03-15 DIAGNOSIS — I10 Essential (primary) hypertension: Secondary | ICD-10-CM | POA: Diagnosis not present

## 2023-03-15 DIAGNOSIS — J301 Allergic rhinitis due to pollen: Secondary | ICD-10-CM | POA: Diagnosis not present

## 2023-03-15 DIAGNOSIS — K219 Gastro-esophageal reflux disease without esophagitis: Secondary | ICD-10-CM

## 2023-03-15 DIAGNOSIS — E876 Hypokalemia: Secondary | ICD-10-CM

## 2023-03-15 MED ORDER — FLUTICASONE PROPIONATE 50 MCG/ACT NA SUSP
1.0000 | Freq: Every day | NASAL | 2 refills | Status: DC
Start: 2023-03-15 — End: 2023-09-20

## 2023-03-15 MED ORDER — LORATADINE 10 MG PO TABS
10.0000 mg | ORAL_TABLET | Freq: Every day | ORAL | 2 refills | Status: DC
Start: 2023-03-15 — End: 2023-09-17

## 2023-03-15 NOTE — Progress Notes (Signed)
Established Patient Office Visit  Subjective:  Patient ID: Elaine Norris, female    DOB: Mar 22, 1946  Age: 77 y.o. MRN: [id number]  Chief Complaint  Patient presents with   Follow-up    2 week follow up    Patient comes in for her follow-up today her blood pressure is much better and she is not having that many dizzy episodes.   She complains of some postnasal drip and a tickle in her throat.  On exam she has bilateral opaque tympanic membranes.  Her throat is clear.  Suspect seasonal allergies.  Will add Flonase nasal spray and Claritin tablet 10 mg p.o. daily. She also just resumed her PPI, had forgotten to take it for a week. Patient's labs had shown a low potassium.  She is taking supplements and will check her labs again today.  She has chronic kidney disease and has Lasix 40 mg prescribed but she is not supposed to take it every day patient reports that it has been a while since she took her last Lasix.  She is under care of nephrologist. Patient brought her home blood pressure monitor for calibration.  Seems like her blood pressure monitor gives a high reading, she is planning to buy a new one.    No other concerns at this time.   Past Medical History:  Diagnosis Date   Anxiety    Related to surgery   Arthritis    hands   Cancer (HCC) 1970's   cervical   Depression    Controlled   Esophagitis    GERD (gastroesophageal reflux disease)    Gout    Hyperlipidemia    Hypertension    Kidney carcinoma (HCC) 06/2020   Myocardial infarction (HCC)    Osteoporosis    feet   Pseudoseizure    Sleep apnea    CPAP    Past Surgical History:  Procedure Laterality Date   CARDIAC CATHETERIZATION  05/2011   [facility]   CARDIAC CATHETERIZATION  09/2011   [facility]   CARDIAC CATHETERIZATION  2012   [facility]   CATARACT EXTRACTION W/PHACO Left 02/17/2018   Procedure: CATARACT EXTRACTION PHACO AND INTRAOCULAR LENS PLACEMENT (IOC) LEFT;  Surgeon: Lockie Mola, MD;  Location: [facility];  Service: Ophthalmology;  Laterality: Left;   CATARACT EXTRACTION W/PHACO Right 03/10/2018   Procedure: CATARACT EXTRACTION PHACO AND INTRAOCULAR LENS PLACEMENT (IOC) RIGHT  COMPLICATED;  Surgeon: Lockie Mola, MD;  Location: [facility];  Service: Ophthalmology;  Laterality: Right;  NEEDS EARLY AM 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 [employer]   CHOLECYSTECTOMY N/A 06/26/2018   Procedure: LAPAROSCOPIC CHOLECYSTECTOMY;  Surgeon: Sheliah Hatch De Blanch, MD;  Location: [facility] ORS;  Service: General;  Laterality: N/A;   COLONOSCOPY     COLONOSCOPY WITH PROPOFOL N/A 01/21/2020   Procedure: COLONOSCOPY WITH PROPOFOL;  Surgeon: Toledo, Boykin Nearing, MD;  Location: [facility] ENDOSCOPY;  Service: Gastroenterology;  Laterality: N/A;   ESOPHAGOGASTRODUODENOSCOPY (EGD) WITH PROPOFOL N/A 01/21/2020   Procedure: ESOPHAGOGASTRODUODENOSCOPY (EGD) WITH PROPOFOL;  Surgeon: Toledo, Boykin Nearing, MD;  Location: [facility] ENDOSCOPY;  Service: Gastroenterology;  Laterality: N/A;   MULTIPLE TOOTH EXTRACTIONS     TONSILLECTOMY     WISDOM TOOTH EXTRACTION      Social History   Socioeconomic History   Marital status: Widowed    Spouse name: Not on file   Number of children: Not on file   Years of education: Not on file   Highest education level: Not on file  Occupational History   Occupation:  retied  Tobacco Use   Smoking status: Passive Smoke Exposure - Never Smoker   Smokeless tobacco: Never  Vaping Use   Vaping Use: Never used  Substance and Sexual Activity   Alcohol use: No   Drug use: No   Sexual activity: Not Currently  Other Topics Concern   Not on file  Social History Narrative   Not on file   Social Determinants of Health   Financial Resource Strain: Low Risk  (06/19/2018)   Overall Financial Resource Strain (CARDIA)    Difficulty of Paying Living Expenses: Not hard at all  Food Insecurity: No Food Insecurity (07/20/2022)   Hunger Vital Sign    Worried About Running Out of Food in the Last  Year: Never true    Ran Out of Food in the Last Year: Never true  Transportation Needs: No Transportation Needs (07/20/2022)   PRAPARE - Administrator, Civil Service (Medical): No    Lack of Transportation (Non-Medical): No  Physical Activity: Unknown (06/19/2018)   Exercise Vital Sign    Days of Exercise per Week: 2 days    Minutes of Exercise per Session: Not on file  Stress: No Stress Concern Present (06/19/2018)   [employer] of Occupational Health - Occupational Stress Questionnaire    Feeling of Stress : Only a little  Social Connections: Moderately Isolated (06/19/2018)   Social Connection and Isolation Panel [NHANES]    Frequency of Communication with Friends and Family: Not on file    Frequency of Social Gatherings with Friends and Family: More than three times a week    Attends Religious Services: Never    Database administrator or Organizations: No    Attends Banker Meetings: Never    Marital Status: Divorced  Catering manager Violence: Not At Risk (06/19/2018)   Humiliation, Afraid, Rape, and Kick questionnaire    Fear of Current or Ex-Partner: No    Emotionally Abused: No    Physically Abused: No    Sexually Abused: No    Family History  Problem Relation Age of Onset   Heart disease Mother    Heart failure Father    Breast cancer Neg Hx     Allergies  Allergen Reactions   Fire Ant Anaphylaxis   Gabapentin     Muscles jerks, weakness and difficulty swallowing - Noted in hospital stay   Albuterol Other (See Comments)    Pt reports seizures    Codeine Other (See Comments)    Altered mental status     Review of Systems  Constitutional:  Negative for chills, diaphoresis, fever, malaise/fatigue and weight loss.  HENT:  Positive for congestion. Negative for ear discharge, ear pain, hearing loss, nosebleeds and sinus pain.   Eyes: Negative.   Respiratory:  Negative for cough, shortness of breath and wheezing.   Cardiovascular:  Negative  for chest pain, palpitations and PND.  Gastrointestinal:  Negative for heartburn, nausea and vomiting.  Skin: Negative.   Neurological:  Positive for dizziness. Negative for tingling, weakness and headaches.  Psychiatric/Behavioral:  Negative for depression. The patient is not nervous/anxious and does not have insomnia.        Objective:   BP 128/70   Pulse 64   Ht 5\' 5"  (1.651 m)   Wt 174 lb 3.2 oz (79 kg)   SpO2 98%   BMI 28.99 kg/m   Vitals:   03/15/23 1012  BP: 128/70  Pulse: 64  Height: 5\' 5"  (1.651 m)  Weight: 174 lb 3.2 oz (79 kg)  SpO2: 98%  BMI (Calculated): 28.99    Physical Exam Vitals and nursing note reviewed.  Constitutional:      Appearance: Normal appearance.  HENT:     Right Ear: Hearing and ear canal normal. A middle ear effusion is present.     Left Ear: Hearing and ear canal normal. A middle ear effusion is present.     Nose: Nose normal.     Mouth/Throat:     Mouth: Mucous membranes are moist.     Pharynx: Oropharynx is clear. No oropharyngeal exudate or posterior oropharyngeal erythema.  Cardiovascular:     Rate and Rhythm: Normal rate and regular rhythm.     Pulses: Normal pulses.     Heart sounds: Murmur heard.  Pulmonary:     Effort: Pulmonary effort is normal.     Breath sounds: Normal breath sounds.  Abdominal:     General: Bowel sounds are normal.     Palpations: Abdomen is soft.  Musculoskeletal:        General: Normal range of motion.     Cervical back: Normal range of motion and neck supple.  Neurological:     General: No focal deficit present.     Mental Status: She is alert and oriented to person, place, and time.  Psychiatric:        Mood and Affect: Mood normal.        Behavior: Behavior normal.      No results found for any visits on 03/15/23.  Recent Results (from the past 2160 hour(s))  CMP14+EGFR     Status: Abnormal   Collection Time: 03/01/23 12:10 PM  Result Value Ref Range   Glucose 100 (H) 70 - 99 mg/dL    BUN 37 (H) 8 - 27 mg/dL   Creatinine, Ser 4.09 (H) 0.57 - 1.00 mg/dL   eGFR 23 (L) >81 XB/JYN/8.29   BUN/Creatinine Ratio 17 12 - 28   Sodium 139 134 - 144 mmol/L   Potassium 3.2 (L) 3.5 - 5.2 mmol/L   Chloride 98 96 - 106 mmol/L   CO2 24 20 - 29 mmol/L   Calcium 9.4 8.7 - 10.3 mg/dL   Total Protein 6.6 6.0 - 8.5 g/dL   Albumin 4.3 3.8 - 4.8 g/dL   Globulin, Total 2.3 1.5 - 4.5 g/dL   Albumin/Globulin Ratio 1.9 1.2 - 2.2   Bilirubin Total 0.5 0.0 - 1.2 mg/dL   Alkaline Phosphatase 93 44 - 121 IU/L   AST 22 0 - 40 IU/L   ALT 22 0 - 32 IU/L  Lipid Panel w/o Chol/HDL Ratio     Status: Abnormal   Collection Time: 03/01/23 12:10 PM  Result Value Ref Range   Cholesterol, Total 168 100 - 199 mg/dL   Triglycerides 562 (H) 0 - 149 mg/dL   HDL 39 (L) >13 mg/dL   VLDL Cholesterol Cal 36 5 - 40 mg/dL   LDL Chol Calc (NIH) 93 0 - 99 mg/dL  CBC With Differential     Status: Abnormal   Collection Time: 03/01/23 12:10 PM  Result Value Ref Range   WBC 10.1 3.4 - 10.8 x10E3/uL   RBC 4.09 3.77 - 5.28 x10E6/uL   Hemoglobin 12.2 11.1 - 15.9 g/dL   Hematocrit 08.6 57.8 - 46.6 %   MCV 92 79 - 97 fL   MCH 29.8 26.6 - 33.0 pg   MCHC 32.5 31.5 - 35.7 g/dL   RDW 46.9 62.9 -  15.4 %   Neutrophils 78 Not Estab. %   Lymphs 14 Not Estab. %   Monocytes 6 Not Estab. %   Eos 1 Not Estab. %   Basos 1 Not Estab. %   Neutrophils Absolute 7.9 (H) 1.4 - 7.0 x10E3/uL   Lymphocytes Absolute 1.4 0.7 - 3.1 x10E3/uL   Monocytes Absolute 0.6 0.1 - 0.9 x10E3/uL   EOS (ABSOLUTE) 0.1 0.0 - 0.4 x10E3/uL   Basophils Absolute 0.1 0.0 - 0.2 x10E3/uL   Immature Granulocytes 0 Not Estab. %   Immature Grans (Abs) 0.0 0.0 - 0.1 x10E3/uL  Uric acid     Status: None   Collection Time: 03/01/23 12:10 PM  Result Value Ref Range   Uric Acid 7.0 3.1 - 7.9 mg/dL    Comment:            Therapeutic target for gout patients: <6.0      Assessment & Plan:  Check potassium again today.  Prescription sent for Flonase and  Claritin.  To continue all the other medications as such. Problem List Items Addressed This Visit     Essential hypertension, benign - Primary   Relevant Orders   Basic metabolic panel   Hyperlipidemia   Hypokalemia   GERD (gastroesophageal reflux disease)   Seasonal allergic rhinitis due to pollen   Relevant Medications   loratadine (CLARITIN) 10 MG tablet   fluticasone (FLONASE) 50 MCG/ACT nasal spray   Stage 3b chronic kidney disease (HCC)    Return in about 3 months (around 06/15/2023).   Total time spent: 25 minutes  Margaretann Loveless, MD  03/15/2023

## 2023-03-16 LAB — BASIC METABOLIC PANEL
BUN/Creatinine Ratio: 15 (ref 12–28)
BUN: 26 mg/dL (ref 8–27)
CO2: 22 mmol/L (ref 20–29)
Calcium: 8.8 mg/dL (ref 8.7–10.3)
Chloride: 104 mmol/L (ref 96–106)
Creatinine, Ser: 1.7 mg/dL — ABNORMAL HIGH (ref 0.57–1.00)
Glucose: 94 mg/dL (ref 70–99)
Potassium: 4.2 mmol/L (ref 3.5–5.2)
Sodium: 140 mmol/L (ref 134–144)
eGFR: 31 mL/min/{1.73_m2} — ABNORMAL LOW (ref >59)

## 2023-03-18 ENCOUNTER — Other Ambulatory Visit: Payer: Self-pay | Admitting: Cardiovascular Disease

## 2023-03-18 ENCOUNTER — Other Ambulatory Visit: Payer: Self-pay | Admitting: Family

## 2023-03-19 ENCOUNTER — Ambulatory Visit (INDEPENDENT_AMBULATORY_CARE_PROVIDER_SITE_OTHER): Payer: Self-pay

## 2023-03-19 DIAGNOSIS — I371 Nonrheumatic pulmonary valve insufficiency: Secondary | ICD-10-CM

## 2023-03-19 DIAGNOSIS — I361 Nonrheumatic tricuspid (valve) insufficiency: Secondary | ICD-10-CM | POA: Diagnosis not present

## 2023-03-19 DIAGNOSIS — I340 Nonrheumatic mitral (valve) insufficiency: Secondary | ICD-10-CM | POA: Diagnosis not present

## 2023-03-21 ENCOUNTER — Ambulatory Visit: Payer: Medicare Other | Admitting: Cardiovascular Disease

## 2023-03-21 ENCOUNTER — Encounter: Payer: Self-pay | Admitting: Cardiovascular Disease

## 2023-03-21 VITALS — BP 136/76 | HR 58 | Ht 65.0 in | Wt 174.2 lb

## 2023-03-21 DIAGNOSIS — I340 Nonrheumatic mitral (valve) insufficiency: Secondary | ICD-10-CM

## 2023-03-21 DIAGNOSIS — E782 Mixed hyperlipidemia: Secondary | ICD-10-CM | POA: Diagnosis not present

## 2023-03-21 DIAGNOSIS — I10 Essential (primary) hypertension: Secondary | ICD-10-CM | POA: Diagnosis not present

## 2023-03-21 MED ORDER — FARXIGA 10 MG PO TABS: 10.0000 mg | ORAL_TABLET | Freq: Every morning | ORAL | 1 refills | Status: AC

## 2023-03-21 MED ORDER — LABETALOL HCL 100 MG PO TABS: 100.0000 mg | ORAL_TABLET | Freq: Two times a day (BID) | ORAL | 1 refills | Status: DC

## 2023-03-21 MED ORDER — ATORVASTATIN CALCIUM 40 MG PO TABS: ORAL_TABLET | ORAL | 1 refills | Status: DC

## 2023-03-21 MED ORDER — FUROSEMIDE 40 MG PO TABS: 40.0000 mg | ORAL_TABLET | Freq: Every day | ORAL | 1 refills | Status: DC

## 2023-03-21 MED ORDER — PANTOPRAZOLE SODIUM 40 MG PO TBEC: 40.0000 mg | DELAYED_RELEASE_TABLET | Freq: Every day | ORAL | 1 refills | Status: DC

## 2023-03-21 NOTE — Assessment & Plan Note (Signed)
Echo 02/2023 mild MVR

## 2023-03-21 NOTE — Assessment & Plan Note (Signed)
Well controlled, continue same medications.  

## 2023-03-21 NOTE — Assessment & Plan Note (Signed)
02/2023 LDL 93, continue atorvastatin.

## 2023-03-21 NOTE — Progress Notes (Signed)
Cardiology Office Note   Date:  03/21/2023   ID:  Elaine Norris 22-Jul-1946, MRN [id number]  PCP:  Margaretann Loveless, MD  Cardiologist:  Adrian Blackwater, MD      History of Present Illness: Elaine Norris is a 77 y.o. female who presents for  Chief Complaint  Patient presents with   Follow-up    Follow up    Patient in office for routine cardiac exam, discuss recent echo results. Denies chest pain, shortness of breath, edema, palpitations.     Past Medical History:  Diagnosis Date   Anxiety    Related to surgery   Arthritis    hands   Cancer (HCC) 1970's   cervical   Depression    Controlled   Esophagitis    GERD (gastroesophageal reflux disease)    Gout    Hyperlipidemia    Hypertension    Kidney carcinoma (HCC) 06/2020   Myocardial infarction (HCC)    Osteoporosis    feet   Pseudoseizure    Sleep apnea    CPAP     Past Surgical History:  Procedure Laterality Date   CARDIAC CATHETERIZATION  05/2011   [facility]   [facility]   [facility]ATION  2012   [facility]   CATARACT EXTRACTION W/PHACO Left 02/17/2018   Procedure: CATARACT EXTRACTION PHACO AND INTRAOCULAR LENS PLACEMENT (IOC) LEFT;  Surgeon: Lockie Mola, MD;  Location: [facility];  Service: Ophthalmology;  Laterality: Left;   CATARACT EXTRACTION W/PHACO Right 03/10/2018   Procedure: CATARACT EXTRACTION PHACO AND INTRAOCULAR LENS PLACEMENT (IOC) RIGHT  COMPLICATED;  Surgeon: Lockie Mola, MD;  Location: [facility];  Service: Ophthalmology;  Laterality: Right;  NEEDS EARLY AM 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 malyuhin   CHOLECYSTECTOMY N/A 06/26/2018   Procedure: LAPAROSCOPIC CHOLECYSTECTOMY;  Surgeon: Sheliah Hatch De Blanch, MD;  Location: [facility];  Service: General;  Laterality: N/A;   COLONOSCOPY     COLONOSCOPY WITH PROPOFOL N/A 01/21/2020   Procedure: COLONOSCOPY WITH PROPOFOL;  Surgeon: Toledo, Boykin Nearing, MD;  Location: [facility]  ENDOSCOPY;  Service: Gastroenterology;  Laterality: N/A;   ESOPHAGOGASTRODUODENOSCOPY (EGD) WITH PROPOFOL N/A 01/21/2020   Procedure: ESOPHAGOGASTRODUODENOSCOPY (EGD) WITH PROPOFOL;  Surgeon: Toledo, Boykin Nearing, MD;  Location: [facility] END[facility];  Service: Gastroenterology;  Laterality: N/A;   MULTIPLE TOOTH EXTRACTIONS     TONSILLECTOMY     WISDOM TOOTH EXTRACTION       Current Outpatient Medications  Medication Sig Dispense Refill   acetaminophen (TYLENOL) 500 MG tablet Take 500-1,000 mg by mouth every 6 (six) hours as needed for mild pain or fever.      alendronate (FOSAMAX) 70 MG tablet Take 70 mg by mouth every Saturday.      allopurinol (ZYLOPRIM) 100 MG tablet Take 100 mg by mouth at bedtime.      aspirin 81 MG tablet Take 81 mg by mouth at bedtime.      calcium-vitamin D 250-100 MG-UNIT tablet Take 1 tablet by mouth 2 (two) times daily.     citalopram (CELEXA) 20 MG tablet TAKE 1 TABLET BY MOUTH EVERY DAY 90 tablet 0   EPINEPHrine 0.3 mg/0.3 mL IJ SOAJ injection Inject 0.3 mg into the muscle as needed for anaphylaxis.     ferrous sulfate 325 (65 FE) MG EC tablet Take by mouth.     fluticasone (FLONASE) 50 MCG/ACT nasal spray Place 1 spray into both nostrils daily. 16 g 2   hydrALAZINE (APRESOLINE) 50 MG  tablet TAKE 1 TABLET BY MOUTH TWICE DAILY. MAKE. FOLLOW UP APPOINTMENT WITH DOCTOR Greyson Peavy FOR REFILLS** 180 tablet 0   loperamide (IMODIUM A-D) 2 MG tablet Take 1 tablet (2 mg total) by mouth 4 (four) times daily as needed for diarrhea or loose stools. 12 tablet 0   loratadine (CLARITIN) 10 MG tablet Take 1 tablet (10 mg total) by mouth daily. 30 tablet 2   LORazepam (ATIVAN) 0.5 MG tablet Take 0.5 mg by mouth 2 (two) times daily as needed.     losartan (COZAAR) 100 MG tablet Take 1 tablet (100 mg total) by mouth daily. 90 tablet 0   Multiple Vitamin (MULTIVITAMIN WITH MINERALS) TABS tablet Take 1 tablet by mouth daily.     nitroGLYCERIN (NITROSTAT) 0.4 MG SL tablet Place 0.4 mg under the  tongue every 5 (five) minutes as needed for chest pain.     ondansetron (ZOFRAN ODT) 4 MG disintegrating tablet Take 1 tablet (4 mg total) by mouth every 8 (eight) hours as needed for nausea or vomiting. 15 tablet 0   atorvastatin (LIPITOR) 40 MG tablet TAKE 1 TABLET BY MOUTH EVERY DAY. MAKE. FOLLOW UP APPOINTMENT WITH DOCTOR Hendrix Console FOR REFILLS** 90 tablet 1   FARXIGA 10 MG TABS tablet Take 1 tablet (10 mg total) by mouth every morning. 90 tablet 1   furosemide (LASIX) 40 MG tablet Take 1 tablet (40 mg total) by mouth daily. 90 tablet 1   labetalol (NORMODYNE) 100 MG tablet Take 1 tablet (100 mg total) by mouth 2 (two) times daily. 180 tablet 1   pantoprazole (PROTONIX) 40 MG tablet Take 1 tablet (40 mg total) by mouth daily. 90 tablet 1   No current facility-administered medications for this visit.    Allergies:   Fire ant, Gabapentin, Albuterol, and Codeine    Social History:   reports that she has never smoked. She has been exposed to tobacco smoke. She has never used smokeless tobacco. She reports that she does not drink alcohol and does not use drugs.   Family History:  family history includes Heart disease in her mother; Heart failure in her father.    ROS:     Review of Systems  Constitutional: Negative.   HENT: Negative.    Eyes: Negative.   Respiratory: Negative.    Cardiovascular: Negative.   Gastrointestinal: Negative.   Genitourinary: Negative.   Musculoskeletal: Negative.   Skin: Negative.   Neurological: Negative.   Endo/Heme/Allergies: Negative.   Psychiatric/Behavioral: Negative.    All other systems reviewed and are negative.   All other systems are reviewed and negative.    PHYSICAL EXAM: VS:  BP 136/76   Pulse (!) 58   Ht 5\' 5"  (1.651 m)   Wt 174 lb 3.2 oz (79 kg)   SpO2 98%   BMI 28.99 kg/m  , BMI Body mass index is 28.99 kg/m. Last weight:  Wt Readings from Last 3 Encounters:  03/21/23 174 lb 3.2 oz (79 kg)  03/15/23 174 lb 3.2 oz (79 kg)   03/01/23 171 lb 9.6 oz (77.8 kg)    Physical Exam Constitutional:      Appearance: Normal appearance.  Cardiovascular:     Rate and Rhythm: Normal rate and regular rhythm.     Heart sounds: Normal heart sounds.  Pulmonary:     Effort: Pulmonary effort is normal.     Breath sounds: Normal breath sounds.  Musculoskeletal:     Right lower leg: No edema.     Left lower  leg: No edema.  Neurological:     Mental Status: She is alert.     EKG: none today  Recent Labs: 10/11/2022: Platelets 203 03/01/2023: ALT 22; Hemoglobin 12.2 03/15/2023: BUN 26; Creatinine, Ser 1.70; Potassium 4.2; Sodium 140    Lipid Panel    Component Value Date/Time   CHOL 168 03/01/2023 1210   TRIG 209 (H) 03/01/2023 1210   HDL 39 (L) 03/01/2023 1210   LDLCALC 93 03/01/2023 1210      Other studies Reviewed: Patient: [id number] - Elaine Norris DOB:  03/24/1946  Date:  06/02/2021 09:00 Provider: Adrian Blackwater MD Encounter: ECHO   Page 2 REASON FOR VISIT  Visit for: Echocardiogram/Chest pain unspecified  Sex:    female   wt=  175  lbs.  BP= 124/66  Height=  65  inches.   TESTS  Imaging: Echocardiogram:  An echocardiogram in (2-d) mode was performed and in Doppler mode with color flow velocity mapping was performed. The aortic valve cusps are abnormal 1.7   cm, flow velocity 1.8   m/s, and systolic calculated mean flow gradient 7   mmHg. Mitral valve diastolic peak flow velocity E 1   m/s and E/A ratio 1.1. Aortic root diameter 3.1   cm. The LVOT internal diameter 2   cm and flow velocity was abnormal 0.8  m/s. LV systolic dimension 3.5  cm, diastolic 5  cm, posterior wall thickness 1.1   cm, fractional shortening 30 %, and EF 51 %. IVS thickness 1.1  cm. LA dimension 4.1 cm  RIGHT atrium=  17.9  cm2. Mitral Valve =  Ea= 6  DT= 190 msec. Tricuspid Valve =  TR jet V=   2.7   RAP= 5  RVSP=  34   mmHg. Aortic Valve is Normal. Pulmonic Valve= PIEDV=  1.2    m/s. Mitral Valve has  Moderate Regurgitation. Pulmonic Valve has Mild Regurgitation. Tricuspid Valve has Mild Regurgitation.     ASSESSMENT  Technically adequate study.  Ejection fraction-51  Left Ventricle- Normal size   Left Ventricle diastolic dysfunction grade-Grade 1 relaxation abnormality  Right Ventricle- Normal size and function  Normal right ventricular wall motion  Left Atrium-Severely dilated  Right Atrium- Mildly dilated  Aortic valve-Mild calcification of aortic valve cusps with No stenosis, No regurgitation  Pulmonic Valve-No stenosis, Mild regurgitation  Mitral Valve-Moderate regurgitation  Tricuspid Valve-Mild Regurgitation  No Pericardial effusion.   THERAPY   Referring physician: Laurier Nancy  Sonographer: Lenor Derrick.  Adrian Blackwater MD  Electronically signed by: Adrian Blackwater     Date: 06/02/2021 14:53  Patient: [id number] - Elaine Norris DOB:  09-18-1946  Date:  12/06/2020 07:45 Provider: Adrian Blackwater MD Encounter: [facility] TEST   Page 1 TESTS    [facility] [address] [place], [place] [address] [phone] STUDY:  Gated Stress / Rest Myocardial Perfusion Imaging Tomographic (SPECT) Including attenuation correction Wall Motion, Left Ventricular Ejection Fraction By Gated Technique.Persantine Stress Test. SEX:  Female   WEIGHT: 168 lbs   HEIGHT: 65 in                ARMS UP: YES/NO  REFERRING PHYSICIAN: Dr.Javelle Donigan [place] Flakes                                                                                                                                                                                                                       INDICATION FOR STUDY: Intercostal Pain                                                                                                                                                                                                                     TECHNIQUE:  Approximately 20 minutes following the intravenous administration of 10.3 mCi of Tc-64m Sestamibi after stress testing in a reclined supine position with arms above their head if able to do so, gated SPECT imaging of the heart was performed. After about a 2hr break, the patient was injected intravenously with 31.6 mCi of Tc-75m Sestamibi.  Approximately 45 minutes later in the same position as stress imaging SPECT rest imaging of the heart was performed.  STRESS BY:  Adrian Blackwater, MD PROTOCOL:  Persantine   DOSE ADMIN:  8.8 cc    ROUTE OF ADMINISTRATION: IV  MAX PRED HR: 145                     85%: 123               75%: 109                                                                                                                   RESTING BP: 120/64   RESTING HR: 65  PEAK BP: 110/50    PEAK HR: 85                                                                    EXERCISE DURATION:    4 min injection                                            REASON FOR TEST TERMINATION:    Protocol end                                                                                                                              SYMPTOMS:   None  EKG RESULTS:  NSR. 68/min. No significant ST changes with persantine.                                                            IMAGE QUALITY:    Good                                                                                                                                                                                                                                                                                                                                PERFUSION/WALL MOTION FINDINGS: EF = 73%. No perfusion defects, normal wall motion.                                                                          IMPRESSION:   Normal stress test with normal LVEF.  Adrian Blackwater, MD Stress Interpreting Physician / Nuclear Interpreting Physician  Adrian Blackwater MD  Electronically signed by: Adrian Blackwater     Date: 12/15/2020 12:18  Patient: [id number] - Elaine Norris DOB:  04-05-1946  Date:  01/23/2019 11:00 Provider: Adrian Blackwater MD Encounter: ALL ANGIOGRAMS (CTA BRAIN, CAROTIDS, RENAL ARTERIES, PE)   Page 2 REASON FOR VISIT  Referred by Dr. Adrian Blackwater.  TESTS  Imaging: Computed Tomographic Angiography:  Cardiac multidetector CT was performed paying particular attention to the coronary arteries for the diagnosis of: Intercostal Pain. Diagnostic Drugs:  Administered iohexol (Omnipaque) through an antecubital vein and images from the examination were analyzed for the presence and extent of coronary artery disease, using 3D image processing software. 100 mL of non-ionic contrast (Omnipaque) was used.   TEST CONCLUSIONS  Quality of study: Suboptimal/Poor.  1-Calcium score: 7.3  2-Righ dominant system.   3-Normal coronaries.  Adrian Blackwater MD  Electronically signed by: Adrian Blackwater     Date: 01/26/2019 13:47   ASSESSMENT AND PLAN:    ICD-10-CM   1. Essential hypertension, benign  I10     2. Nonrheumatic mitral valve regurgitation  I34.0     3. Mixed hyperlipidemia  E78.2        Problem List Items Addressed This Visit       Cardiovascular and Mediastinum   Essential  hypertension, benign - Primary    Well controlled, continue same medications.       Relevant Medications   atorvastatin (LIPITOR) 40 MG tablet   furosemide (LASIX) 40 MG tablet   labetalol (NORMODYNE) 100 MG tablet   Nonrheumatic mitral valve regurgitation    Echo 02/2023 mild MVR      Relevant Medications   atorvastatin (LIPITOR) 40 MG tablet   furosemide (LASIX) 40 MG tablet   labetalol (NORMODYNE) 100 MG tablet     Other   Hyperlipidemia    02/2023 LDL 93, continue atorvastatin.       Relevant Medications   atorvastatin (LIPITOR) 40 MG tablet   furosemide (LASIX) 40 MG tablet   labetalol (NORMODYNE) 100 MG tablet    Disposition:   Return in about 6 months (around 09/21/2023).    Total time spent: 30 minutes  Signed,  Adrian Blackwater, MD  03/21/2023 11:31 AM    [facility]

## 2023-04-16 DIAGNOSIS — I10 Essential (primary) hypertension: Secondary | ICD-10-CM | POA: Diagnosis not present

## 2023-04-16 DIAGNOSIS — E782 Mixed hyperlipidemia: Secondary | ICD-10-CM | POA: Diagnosis not present

## 2023-04-16 DIAGNOSIS — E049 Nontoxic goiter, unspecified: Secondary | ICD-10-CM | POA: Diagnosis not present

## 2023-04-16 DIAGNOSIS — E1165 Type 2 diabetes mellitus with hyperglycemia: Secondary | ICD-10-CM | POA: Diagnosis not present

## 2023-04-16 DIAGNOSIS — M810 Age-related osteoporosis without current pathological fracture: Secondary | ICD-10-CM | POA: Diagnosis not present

## 2023-04-23 DIAGNOSIS — I10 Essential (primary) hypertension: Secondary | ICD-10-CM | POA: Diagnosis not present

## 2023-04-23 DIAGNOSIS — E1165 Type 2 diabetes mellitus with hyperglycemia: Secondary | ICD-10-CM | POA: Diagnosis not present

## 2023-04-23 DIAGNOSIS — N184 Chronic kidney disease, stage 4 (severe): Secondary | ICD-10-CM | POA: Diagnosis not present

## 2023-04-23 DIAGNOSIS — M810 Age-related osteoporosis without current pathological fracture: Secondary | ICD-10-CM | POA: Diagnosis not present

## 2023-04-23 DIAGNOSIS — E049 Nontoxic goiter, unspecified: Secondary | ICD-10-CM | POA: Diagnosis not present

## 2023-05-13 ENCOUNTER — Other Ambulatory Visit: Payer: Self-pay | Admitting: Cardiovascular Disease

## 2023-05-15 ENCOUNTER — Other Ambulatory Visit: Payer: Self-pay

## 2023-05-21 DIAGNOSIS — N184 Chronic kidney disease, stage 4 (severe): Secondary | ICD-10-CM | POA: Diagnosis not present

## 2023-05-21 DIAGNOSIS — D631 Anemia in chronic kidney disease: Secondary | ICD-10-CM | POA: Diagnosis not present

## 2023-05-21 DIAGNOSIS — I129 Hypertensive chronic kidney disease with stage 1 through stage 4 chronic kidney disease, or unspecified chronic kidney disease: Secondary | ICD-10-CM | POA: Diagnosis not present

## 2023-05-21 DIAGNOSIS — N2581 Secondary hyperparathyroidism of renal origin: Secondary | ICD-10-CM | POA: Diagnosis not present

## 2023-05-21 DIAGNOSIS — E876 Hypokalemia: Secondary | ICD-10-CM | POA: Diagnosis not present

## 2023-06-10 ENCOUNTER — Other Ambulatory Visit: Payer: Self-pay | Admitting: Family

## 2023-06-13 ENCOUNTER — Other Ambulatory Visit: Payer: Self-pay | Admitting: Internal Medicine

## 2023-06-13 DIAGNOSIS — Z1231 Encounter for screening mammogram for malignant neoplasm of breast: Secondary | ICD-10-CM

## 2023-06-14 ENCOUNTER — Ambulatory Visit: Payer: Medicare Other | Admitting: Internal Medicine

## 2023-06-18 ENCOUNTER — Other Ambulatory Visit: Payer: Self-pay | Admitting: Cardiovascular Disease

## 2023-06-18 MED ORDER — CITALOPRAM HYDROBROMIDE 20 MG PO TABS: 20.0000 mg | ORAL_TABLET | Freq: Every day | ORAL | 0 refills | Status: AC

## 2023-06-19 ENCOUNTER — Other Ambulatory Visit: Payer: Self-pay | Admitting: Cardiovascular Disease

## 2023-06-21 ENCOUNTER — Ambulatory Visit: Payer: Medicare Other | Admitting: Internal Medicine

## 2023-06-24 ENCOUNTER — Other Ambulatory Visit: Payer: Self-pay | Admitting: Family

## 2023-06-25 ENCOUNTER — Ambulatory Visit (INDEPENDENT_AMBULATORY_CARE_PROVIDER_SITE_OTHER): Payer: Medicare Other | Admitting: Internal Medicine

## 2023-06-25 ENCOUNTER — Encounter: Payer: Self-pay | Admitting: Internal Medicine

## 2023-06-25 VITALS — BP 118/70 | HR 68 | Ht 65.0 in | Wt 166.2 lb

## 2023-06-25 DIAGNOSIS — M1000 Idiopathic gout, unspecified site: Secondary | ICD-10-CM

## 2023-06-25 DIAGNOSIS — E162 Hypoglycemia, unspecified: Secondary | ICD-10-CM | POA: Diagnosis not present

## 2023-06-25 DIAGNOSIS — E782 Mixed hyperlipidemia: Secondary | ICD-10-CM | POA: Diagnosis not present

## 2023-06-25 DIAGNOSIS — I10 Essential (primary) hypertension: Secondary | ICD-10-CM

## 2023-06-25 DIAGNOSIS — E559 Vitamin D deficiency, unspecified: Secondary | ICD-10-CM | POA: Diagnosis not present

## 2023-06-25 DIAGNOSIS — E538 Deficiency of other specified B group vitamins: Secondary | ICD-10-CM

## 2023-06-25 DIAGNOSIS — M62838 Other muscle spasm: Secondary | ICD-10-CM | POA: Diagnosis not present

## 2023-06-25 DIAGNOSIS — M810 Age-related osteoporosis without current pathological fracture: Secondary | ICD-10-CM

## 2023-06-25 LAB — POCT CBG (FASTING - GLUCOSE)-MANUAL ENTRY: Glucose Fasting, POC: 119 mg/dL — AB (ref 70–99)

## 2023-06-25 MED ORDER — BACLOFEN 10 MG PO TABS
10.0000 mg | ORAL_TABLET | ORAL | 3 refills | Status: DC | PRN
Start: 2023-06-25 — End: 2023-06-25

## 2023-06-25 NOTE — Progress Notes (Signed)
Established Patient Office Visit  Subjective:  Patient ID: Elaine Norris, female    DOB: 1946-05-30  Age: 77 y.o. MRN: [id number]  Chief Complaint  Patient presents with   Follow-up    3 month follow up    Patient comes in for her follow-up today.  She started feeling shaky while waiting.  Reports she had to take an early bus today and did not have a good breakfast.  In the in the room she took a granola bar had some water and started to settle down her fingerstick sugar glucose was actually 119. Patient has lost some weight, but says that she gets hungry and she eats regular meals except mostly these are soups which do have protein and vegetables and it.  Denies any nausea vomiting, no no diarrhea or constipation.  In general she feels quite well.  She is fasting for blood work. Needs referral for Prolia injections for osteoporosis. Her current endocrinologist is retiring.  However I think you  No other concerns at this time.   Past Medical History:  Diagnosis Date   Anxiety    Related to surgery   Arthritis    hands   Cancer (HCC) 1970's   cervical   Depression    Controlled   Esophagitis    GERD (gastroesophageal reflux disease)    Gout    Hyperlipidemia    Hypertension    Kidney carcinoma (HCC) 06/2020   Myocardial infarction (HCC)    Osteoporosis    feet   Pseudoseizure    Sleep apnea    CPAP    Past Surgical History:  Procedure Laterality Date   CARDIAC CATHETERIZATION  05/2011   [facility]   CARD[facility]AC [facility]ATION  09/2011   [facility]   CARDIAC [facility]IZATION  2012   [facility]   CATARACT EXTRACTION W/PHACO Left 02/17/2018   Procedure: CATARACT EXTRACTION PHACO AND INTRAOCULAR LENS PLACEMENT (IOC) LEFT;  Surgeon: Lockie Mola, MD;  Location: [facility];  Service: Ophthalmology;  Laterality: Left;   CATARACT EXTRACTION W/PHACO Right 03/10/2018   Procedure: CATARACT EXTRACTION PHACO AND INTRAOCULAR LENS PLACEMENT (IOC) RIGHT  COMPLICATED;  Surgeon:  Lockie Mola, MD;  Location: [facility];  Service: Ophthalmology;  Laterality: Right;  NEEDS EARLY AM 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 malyuhin   CHOLECYSTECTOMY N/A 06/26/2018   Procedure: LAPAROSCOPIC CHOLECYSTECTOMY;  Surgeon: Sheliah Hatch De Blanch, MD;  Location: [facility];  Service: General;  Laterality: N/A;   COLONOSCOPY     COLONOSCOPY WITH PROPOFOL N/A 01/21/2020   Procedure: COLONOSCOPY WITH PROPOFOL;  Surgeon: Toledo, Boykin Nearing, MD;  Location: [facility];  Service: Gastroenterology;  Laterality: N/A;   ESOPHAGOGASTRODUODENOSCOPY (EGD) WITH PROPOFOL N/A 01/21/2020   Procedure: ESOPHAGOGASTRODUODENOSCOPY (EGD) WITH PROPOFOL;  Surgeon: Toledo, Boykin Nearing, MD;  Location: [facility];  Service: Gastroenterology;  Laterality: N/A;   MULTIPLE TOOTH EXTRACTIONS     TONSILLECTOMY     WISDOM TOOTH EXTRACTION      Social History   Socioeconomic History   Marital status: Widowed    Spouse name: Not on file   Number of children: Not on file   Years of education: Not on file   Highest education level: Not on file  Occupational History   Occupation: retied  Tobacco Use   Smoking status: Never    Passive exposure: Yes   Smokeless tobacco: Never  Vaping Use   Vaping status: Never Used  Substance and Sexual Activity   Alcohol use: No   Drug use: No  Sexual activity: Not Currently  Other Topics Concern   Not on file  Social History Narrative   Not on file   Social Determinants of Health   Financial Resource Strain: Low Risk  (06/19/2018)   Overall Financial Resource Strain (CARDIA)    Difficulty of Paying Living Expenses: Not hard at all  Food Insecurity: No Food Insecurity (07/20/2022)   Hunger Vital Sign    Worried About Running Out of Food in the Last Year: Never true    Ran Out of Food in the Last Year: Never true  Transportation Needs: No Transportation Needs (07/20/2022)   PRAPARE - Administrator, Civil Service (Medical): No    Lack of  Transportation (Non-Medical): No  Physical Activity: Unknown (06/19/2018)   Exercise Vital Sign    Days of Exercise per Week: 2 days    Minutes of Exercise per Session: Not on file  Stress: No Stress Concern Present (06/19/2018)   [employer] of Occupational Health - Occupational Stress Questionnaire    Feeling of Stress : Only a little  Social Connections: Moderately Isolated (06/19/2018)   Social Connection and Isolation Panel [NHANES]    Frequency of Communication with Friends and Family: Not on file    Frequency of Social Gatherings with Friends and Family: More than three times a week    Attends Religious Services: Never    Database administrator or Organizations: No    Attends Banker Meetings: Never    Marital Status: Divorced  Catering manager Violence: Not At Risk (06/19/2018)   Humiliation, Afraid, Rape, and Kick questionnaire    Fear of Current or Ex-Partner: No    Emotionally Abused: No    Physically Abused: No    Sexually Abused: No    Family History  Problem Relation Age of Onset   Heart disease Mother    Heart failure Father    Breast cancer Neg Hx     Allergies  Allergen Reactions   Fire Ant Anaphylaxis   Gabapentin     Muscles jerks, weakness and difficulty swallowing - Noted in hospital stay   Albuterol Other (See Comments)    Pt reports seizures    Codeine Other (See Comments)    Altered mental status     Review of Systems  Constitutional:  Positive for weight loss. Negative for chills, fever and malaise/fatigue.  HENT: Negative.  Negative for ear discharge and hearing loss.   Eyes: Negative.   Respiratory: Negative.  Negative for cough and shortness of breath.   Cardiovascular: Negative.  Negative for chest pain, palpitations and leg swelling.  Gastrointestinal: Negative.  Negative for abdominal pain, constipation, diarrhea, heartburn, nausea and vomiting.  Genitourinary: Negative.  Negative for dysuria and flank pain.   Musculoskeletal: Negative.  Negative for joint pain and myalgias.  Skin: Negative.   Neurological: Negative.  Negative for dizziness and headaches.  Endo/Heme/Allergies: Negative.   Psychiatric/Behavioral: Negative.  Negative for depression and suicidal ideas. The patient is not nervous/anxious.        Objective:   BP 118/70   Pulse 68   Ht 5\' 5"  (1.651 m)   Wt 166 lb 3.2 oz (75.4 kg)   SpO2 100%   BMI 27.66 kg/m   Vitals:   06/25/23 1109  BP: 118/70  Pulse: 68  Height: 5\' 5"  (1.651 m)  Weight: 166 lb 3.2 oz (75.4 kg)  SpO2: 100%  BMI (Calculated): 27.66    Physical Exam Vitals and nursing note reviewed.  Constitutional:      Appearance: Normal appearance.  HENT:     Head: Normocephalic and atraumatic.     Nose: Nose normal.     Mouth/Throat:     Mouth: Mucous membranes are moist.     Pharynx: Oropharynx is clear.  Eyes:     Conjunctiva/sclera: Conjunctivae normal.     Pupils: Pupils are equal, round, and reactive to light.  Cardiovascular:     Rate and Rhythm: Normal rate and regular rhythm.     Pulses: Normal pulses.     Heart sounds: Normal heart sounds. No murmur heard. Pulmonary:     Effort: Pulmonary effort is normal.     Breath sounds: Normal breath sounds. No wheezing.  Abdominal:     General: Bowel sounds are normal.     Palpations: Abdomen is soft.     Tenderness: There is no abdominal tenderness. There is no right CVA tenderness or left CVA tenderness.  Musculoskeletal:        General: Normal range of motion.     Cervical back: Normal range of motion.     Right lower leg: No edema.     Left lower leg: No edema.  Skin:    General: Skin is warm and dry.  Neurological:     General: No focal deficit present.     Mental Status: She is alert and oriented to person, place, and time.  Psychiatric:        Mood and Affect: Mood normal.        Behavior: Behavior normal.      Results for orders placed or performed in visit on 06/25/23  POCT CBG  (Fasting - Glucose)  Result Value Ref Range   Glucose Fasting, POC 119 (A) 70 - 99 mg/dL    Recent Results (from the past 2160 hour(s))  POCT CBG (Fasting - Glucose)     Status: Abnormal   Collection Time: 06/25/23 11:30 AM  Result Value Ref Range   Glucose Fasting, POC 119 (A) 70 - 99 mg/dL      Assessment & Plan:  Patient advised to continue her medications.  Also should have well balanced. Problem List Items Addressed This Visit     Essential hypertension, benign   Relevant Orders   CMP14+EGFR   Hyperlipidemia   Relevant Orders   Lipid Panel w/o Chol/HDL Ratio   Idiopathic gout   Relevant Orders   CBC with Diff   Uric acid   Other Visit Diagnoses     Low blood sugar    -  Primary   Relevant Orders   POCT CBG (Fasting - Glucose) (Completed)   Vitamin B12 deficiency       Relevant Orders   Vitamin B12   Vitamin D deficiency       Relevant Orders   Vitamin D (25 hydroxy)   Muscle spasm       Age-related osteoporosis without current pathological fracture       Relevant Medications   denosumab (PROLIA) 60 MG/ML SOSY injection   Other Relevant Orders   Ambulatory referral to Endocrinology       Follow up 3 months.  Total time spent: 30 minutes  Margaretann Loveless, MD  06/25/2023   This document may have been prepared by [facility] Voice Recognition software and as such may include unintentional dictation errors.

## 2023-06-27 NOTE — Progress Notes (Signed)
Patient notified

## 2023-07-08 ENCOUNTER — Telehealth: Payer: Medicare Other

## 2023-07-12 ENCOUNTER — Ambulatory Visit: Admission: RE | Admit: 2023-07-12 | Payer: Medicare Other | Source: Ambulatory Visit

## 2023-07-12 DIAGNOSIS — Z1231 Encounter for screening mammogram for malignant neoplasm of breast: Secondary | ICD-10-CM | POA: Diagnosis not present

## 2023-07-31 ENCOUNTER — Other Ambulatory Visit: Payer: Self-pay | Admitting: Cardiovascular Disease

## 2023-08-12 ENCOUNTER — Other Ambulatory Visit: Payer: Self-pay | Admitting: Cardiovascular Disease

## 2023-08-13 ENCOUNTER — Other Ambulatory Visit: Payer: Self-pay | Admitting: Cardiovascular Disease

## 2023-08-19 ENCOUNTER — Emergency Department
Admission: EM | Admit: 2023-08-19 | Discharge: 2023-08-19 | Disposition: A | Discharge status: Home / Self Care | Payer: Medicare Other | Attending: Emergency Medicine | Admitting: Emergency Medicine

## 2023-08-19 ENCOUNTER — Emergency Department: Payer: Medicare Other

## 2023-08-19 ENCOUNTER — Other Ambulatory Visit: Payer: Self-pay

## 2023-08-19 ENCOUNTER — Encounter: Payer: Self-pay | Admitting: Emergency Medicine

## 2023-08-19 DIAGNOSIS — Z20822 Contact with and (suspected) exposure to covid-19: Secondary | ICD-10-CM | POA: Insufficient documentation

## 2023-08-19 DIAGNOSIS — I10 Essential (primary) hypertension: Secondary | ICD-10-CM | POA: Diagnosis not present

## 2023-08-19 DIAGNOSIS — R002 Palpitations: Secondary | ICD-10-CM | POA: Diagnosis not present

## 2023-08-19 DIAGNOSIS — N189 Chronic kidney disease, unspecified: Secondary | ICD-10-CM | POA: Insufficient documentation

## 2023-08-19 DIAGNOSIS — E876 Hypokalemia: Secondary | ICD-10-CM | POA: Insufficient documentation

## 2023-08-19 DIAGNOSIS — I129 Hypertensive chronic kidney disease with stage 1 through stage 4 chronic kidney disease, or unspecified chronic kidney disease: Secondary | ICD-10-CM | POA: Diagnosis not present

## 2023-08-19 DIAGNOSIS — R0602 Shortness of breath: Secondary | ICD-10-CM | POA: Diagnosis not present

## 2023-08-19 LAB — CBC WITH DIFFERENTIAL/PLATELET
Abs Immature Granulocytes: 0.01 10*3/uL (ref 0.00–0.07)
Basophils Absolute: 0 10*3/uL (ref 0.0–0.1)
Basophils Relative: 1 %
Eosinophils Absolute: 0.1 10*3/uL (ref 0.0–0.5)
Eosinophils Relative: 2 %
HCT: 34.2 % — ABNORMAL LOW (ref 36.0–46.0)
Hemoglobin: 11.7 g/dL — ABNORMAL LOW (ref 12.0–15.0)
Immature Granulocytes: 0 %
Lymphocytes Relative: 27 %
Lymphs Abs: 2.2 10*3/uL (ref 0.7–4.0)
MCH: 31.2 pg (ref 26.0–34.0)
MCHC: 34.2 g/dL (ref 30.0–36.0)
MCV: 91.2 fL (ref 80.0–100.0)
Monocytes Absolute: 0.7 10*3/uL (ref 0.1–1.0)
Monocytes Relative: 8 %
Neutro Abs: 5.1 10*3/uL (ref 1.7–7.7)
Neutrophils Relative %: 62 %
Platelets: 173 10*3/uL (ref 150–400)
RBC: 3.75 MIL/uL — ABNORMAL LOW (ref 3.87–5.11)
RDW: 12.7 % (ref 11.5–15.5)
WBC: 8.2 10*3/uL (ref 4.0–10.5)
nRBC: 0 % (ref 0.0–0.2)

## 2023-08-19 LAB — RESP PANEL BY RT-PCR (RSV, FLU A&B, COVID)  RVPGX2
Influenza A by PCR: NEGATIVE
Influenza B by PCR: NEGATIVE
Resp Syncytial Virus by PCR: NEGATIVE
SARS Coronavirus 2 by RT PCR: NEGATIVE

## 2023-08-19 LAB — TROPONIN I (HIGH SENSITIVITY)
Troponin I (High Sensitivity): 19 ng/L — ABNORMAL HIGH (ref <18)
Troponin I (High Sensitivity): 19 ng/L — ABNORMAL HIGH (ref <18)

## 2023-08-19 LAB — BASIC METABOLIC PANEL
Anion gap: 13 (ref 5–15)
BUN: 29 mg/dL — ABNORMAL HIGH (ref 8–23)
CO2: 20 mmol/L — ABNORMAL LOW (ref 22–32)
Calcium: 9.3 mg/dL (ref 8.9–10.3)
Chloride: 105 mmol/L (ref 98–111)
Creatinine, Ser: 1.87 mg/dL — ABNORMAL HIGH (ref 0.44–1.00)
GFR, Estimated: 27 mL/min — ABNORMAL LOW (ref >60)
Glucose, Bld: 92 mg/dL (ref 70–99)
Potassium: 2.6 mmol/L — CL (ref 3.5–5.1)
Sodium: 138 mmol/L (ref 135–145)

## 2023-08-19 LAB — MAGNESIUM: Magnesium: 2 mg/dL (ref 1.7–2.4)

## 2023-08-19 MED ORDER — POTASSIUM CHLORIDE 20 MEQ PO PACK
40.0000 meq | PACK | Freq: Once | ORAL | Status: AC
  Administered 2023-08-19: 40 meq via ORAL
  Filled 2023-08-19: qty 2

## 2023-08-19 MED ORDER — POTASSIUM CHLORIDE 20 MEQ PO PACK: 20.0000 meq | PACK | Freq: Every day | ORAL | 0 refills | Status: DC

## 2023-08-19 MED ORDER — LABETALOL HCL 100 MG PO TABS: 100.0000 mg | ORAL_TABLET | Freq: Once | ORAL | Status: DC

## 2023-08-19 MED ORDER — HYDRALAZINE HCL 50 MG PO TABS
50.0000 mg | ORAL_TABLET | Freq: Once | ORAL | Status: AC
  Administered 2023-08-19: 50 mg via ORAL
  Filled 2023-08-19: qty 1

## 2023-08-19 MED ORDER — LACTATED RINGERS IV BOLUS: 1000.0000 mL | Freq: Once | INTRAVENOUS | Status: DC

## 2023-08-19 MED ORDER — SODIUM CHLORIDE 0.9 % IV BOLUS
1000.0000 mL | Freq: Once | INTRAVENOUS | Status: AC
  Administered 2023-08-19: 1000 mL via INTRAVENOUS

## 2023-08-19 MED ORDER — POTASSIUM CHLORIDE 10 MEQ/100ML IV SOLN
10.0000 meq | INTRAVENOUS | Status: AC
  Administered 2023-08-19 (×2): 10 meq via INTRAVENOUS
  Filled 2023-08-19 (×2): qty 100

## 2023-08-19 MED ORDER — POTASSIUM CHLORIDE CRYS ER 20 MEQ PO TBCR
40.0000 meq | EXTENDED_RELEASE_TABLET | Freq: Once | ORAL | Status: AC
  Administered 2023-08-19: 40 meq via ORAL
  Filled 2023-08-19: qty 2

## 2023-08-19 NOTE — ED Notes (Signed)
See triage notes. Patient c/o elevated blood pressure since around 0530 this morning. Patient has hx of htn.

## 2023-08-19 NOTE — ED Triage Notes (Signed)
Pt reports headache that she states started five minutes ago. Pt reports pain to bilateral ears and pain to lower legs. Pt anxious in triage.

## 2023-08-19 NOTE — ED Provider Notes (Signed)
[facility] Provider Note    Event Date/Time   First MD Initiated Contact with Patient 08/19/23 [phone]     (approximate)   History   Chief Complaint Shortness of Breath   HPI  Elaine Norris is a 77 y.o. female with past medical history of hypertension, hyperlipidemia, CKD, and pseudoseizures who presents to the ED complaining of shortness of breath.  Patient states that she woke up about 1 hour prior to arrival feeling dizzy and lightheaded with some difficulty breathing.  This was associated with some palpitations but she denies any pain in her chest.  She states that she has felt this way before when her blood pressure is elevated, found it to be greater than 200 systolic at home.  She states she has been taking her medications as prescribed but has not yet taken her dose of blood pressure medications this morning.  She denies any recent fevers or cough, has not had any pain or swelling in her legs.  She reports having a headache earlier that has since resolved, palpitations have also improved but she continues to feel slightly short of breath.     Physical Exam   Triage Vital Signs: ED Triage Vitals  Encounter Vitals Group     BP 08/19/23 0649 (!) 190/91     Systolic BP Percentile --      Diastolic BP Percentile --      Pulse Rate 08/19/23 0649 67     Resp 08/19/23 0649 20     Temp 08/19/23 0649 98.2 F (36.8 C)     Temp src --      SpO2 08/19/23 0649 99 %     Weight 08/19/23 0651 165 lb (74.8 kg)     Height 08/19/23 0651 5\' 5"  (1.651 m)     Head Circumference --      Peak Flow --      Pain Score 08/19/23 0650 5     Pain Loc --      Pain Education --      Exclude from Growth Chart --     Most recent vital signs: Vitals:   08/19/23 1100 08/19/23 1113  BP: (!) 202/69 (!) 202/69  Pulse: 69 65  Resp: 12 16  Temp:  98 F (36.7 C)  SpO2: 98%     Constitutional: Alert and oriented. Eyes: Conjunctivae are normal. Head:  Atraumatic. Nose: No congestion/rhinnorhea. Mouth/Throat: Mucous membranes are moist.  Cardiovascular: Normal rate, regular rhythm. Grossly normal heart sounds.  2+ radial pulses bilaterally. Respiratory: Normal respiratory effort.  No retractions. Lungs CTAB. Gastrointestinal: Soft and nontender. No distention. Musculoskeletal: No lower extremity tenderness nor edema.  Neurologic:  Normal speech and language. No gross focal neurologic deficits are appreciated.    ED Results / Procedures / Treatments   Labs (all labs ordered are listed, but only abnormal results are displayed) Labs Reviewed  CBC WITH DIFFERENTIAL/PLATELET - Abnormal; Notable for the following components:      Result Value   RBC 3.75 (*)    Hemoglobin 11.7 (*)    HCT 34.2 (*)    All other components within normal limits  BASIC METABOLIC PANEL - Abnormal; Notable for the following components:   Potassium 2.6 (*)    CO2 20 (*)    BUN 29 (*)    Creatinine, Ser 1.87 (*)    GFR, Estimated 27 (*)    All other components within normal limits  TROPONIN I (HIGH SENSITIVITY) - Abnormal; Notable  for the following components:   Troponin I (High Sensitivity) 19 (*)    All other components within normal limits  TROPONIN I (HIGH SENSITIVITY) - Abnormal; Notable for the following components:   Troponin I (High Sensitivity) 19 (*)    All other components within normal limits  RESP PANEL BY RT-PCR (RSV, FLU A&B, COVID)  RVPGX2  MAGNESIUM     EKG  ED ECG REPORT I, Chesley Noon, the attending physician, personally viewed and interpreted this ECG.   Date: 08/19/2023  EKG Time: 8:18  Rate: 60  Rhythm: normal sinus rhythm  Axis: Normal  Intervals:none  ST&T Change: None  RADIOLOGY Chest x-ray reviewed and interpreted by me with no infiltrate, edema, or effusion.  PROCEDURES:  Critical Care performed: No  Procedures   MEDICATIONS ORDERED IN ED: Medications  potassium chloride (KLOR-CON) packet 40 mEq (has no  administration in time range)  hydrALAZINE (APRESOLINE) tablet 50 mg (50 mg Oral Given 08/19/23 0814)  potassium chloride SA (KLOR-CON M) CR tablet 40 mEq (40 mEq Oral Given 08/19/23 0932)  potassium chloride 10 mEq in 100 mL IVPB (10 mEq Intravenous New Bag/Given 08/19/23 1035)  sodium chloride 0.9 % bolus 1,000 mL (1,000 mLs Intravenous New Bag/Given 08/19/23 1000)     IMPRESSION / MDM / ASSESSMENT AND PLAN / ED COURSE  I reviewed the triage vital signs and the nursing notes.                              77 y.o. female with past medical history of hypertension, hyperlipidemia, CKD, and pseudoseizures who presents to the ED complaining of lightheadedness, palpitations, and shortness of breath starting this morning with elevated blood pressure at home.  Patient's presentation is most consistent with acute presentation with potential threat to life or bodily function.  Differential diagnosis includes, but is not limited to, ACS, arrhythmia, anemia, electrolyte abnormality, AKI, CHF, COPD, hypertensive emergency.  Patient nontoxic-appearing and in no acute distress, vital signs remarkable for hypertension but otherwise reassuring.  Patient reports headache has resolved and she has no focal neurologic deficits on exam, doubt intracranial process.  With her palpitations and shortness of breath, we will screen EKG, labs, and chest x-ray.  Blood pressure remains elevated here in the ED and we will give her morning dose of hydralazine.  EKG shows no evidence of arrhythmia or ischemia, 2 sets of troponin mildly elevated but similar to prior baseline, doubt ACS and no evidence of arrhythmia on cardiac monitor.  Labs show stable CKD with hypokalemia, no associated EKG changes noted.  No significant anemia or leukocytosis noted, chest x-ray is unremarkable.  Patient reports feeling better and is asymptomatic on reassessment, BP improving after her home medication.  She was offered admission to the hospital for  management of hypokalemia, but declines.  We will prescribe 5 days of supplemental potassium and patient counseled to follow-up with her PCP for recheck.  She was counseled to return to the ED for new or worsening symptoms, patient and family agree with plan.      FINAL CLINICAL IMPRESSION(S) / ED DIAGNOSES   Final diagnoses:  SOB (shortness of breath)  Palpitations  Hypokalemia  Uncontrolled hypertension     Rx / DC Orders   ED Discharge Orders          Ordered    potassium chloride (KLOR-CON) 20 MEQ packet  Daily        08/19/23 1157  Note:  This document was prepared using Dragon voice recognition software and may include unintentional dictation errors.   Chesley Noon, MD 08/19/23 1200

## 2023-08-19 NOTE — ED Notes (Signed)
Patient needs all oral meds crushed in apple sauce

## 2023-08-23 ENCOUNTER — Ambulatory Visit (INDEPENDENT_AMBULATORY_CARE_PROVIDER_SITE_OTHER): Payer: Medicare Other | Admitting: Internal Medicine

## 2023-08-23 ENCOUNTER — Encounter: Payer: Self-pay | Admitting: Internal Medicine

## 2023-08-23 VITALS — BP 150/94 | HR 64 | Ht 65.0 in | Wt 167.8 lb

## 2023-08-23 DIAGNOSIS — N1832 Chronic kidney disease, stage 3b: Secondary | ICD-10-CM

## 2023-08-23 DIAGNOSIS — E782 Mixed hyperlipidemia: Secondary | ICD-10-CM | POA: Diagnosis not present

## 2023-08-23 DIAGNOSIS — I10 Essential (primary) hypertension: Secondary | ICD-10-CM

## 2023-08-23 DIAGNOSIS — E876 Hypokalemia: Secondary | ICD-10-CM | POA: Diagnosis not present

## 2023-08-23 NOTE — Progress Notes (Signed)
Established Patient Office Visit  Subjective:  Patient ID: Elaine Norris, female    DOB: 01/04/1946  Age: 77 y.o. MRN: [id number]  Chief Complaint  Patient presents with   Hospitalization Follow-up    Hospital follow up    Patient comes in for her hospital follow-up.  She went to ED on 08/18/2024 with complaints of shortness of breath, dizziness, very high blood pressure.  Patient has missed her morning dose of one of the blood pressure medications.  In the emergency room her systolic was above 200.  On lab work her potassium was found to be very low which was then replaced.  Her blood pressure stabilized with the medications and she was sent home.  Today she is feeling much better, has no new complaints.  However her blood pressure is 150/94, admits to eating very salty food for lunch. Patient reports that she was having loose bowel movements prior to her emergency room visit which can account for her low potassium.  Although patient has Lasix still listed on her med list but she is not taking it every day. Will check potassium today again and replace if needed. Patient advised to bring all the medication bottles she has at home at her next visit in 1 week. Continue to monitor blood pressure at home.    No other concerns at this time.   Past Medical History:  Diagnosis Date   Anxiety    Related to surgery   Arthritis    hands   Cancer (HCC) 1970's   cervical   Depression    Controlled   Esophagitis    GERD (gastroesophageal reflux disease)    Gout    Hyperlipidemia    Hypertension    Kidney carcinoma (HCC) 06/2020   Myocardial infarction (HCC)    Osteoporosis    feet   Pseudoseizure    Sleep apnea    CPAP    Past Surgical History:  Procedure Laterality Date   CARDIAC CATHETERIZATION  05/2011   [facility]   CARDIAC CAT[facility]IZATION  09/2011   [facility]   CARDIAC CATHETERIZATION  2012   [facility]   CATARACT EXTRACTION W/PHACO Left 02/17/2018   Procedure: CATARACT EXTRACTION  PHACO AND INTRAOCULAR LENS PLACEMENT (IOC) LEFT;  Surgeon: Lockie Mola, MD;  Location: [facility];  Service: Ophthalmology;  Laterality: Left;   CATARACT EXTRACTION W/PHACO Right 03/10/2018   Procedure: CATARACT EXTRACTION PHACO AND INTRAOCULAR LENS PLACEMENT (IOC) RIGHT  COMPLICATED;  Surgeon: Lockie Mola, MD;  Location: [facility];  Service: Ophthalmology;  Laterality: Right;  NEEDS EARLY AM 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 mal[employer]in   CHOLECYSTECTOMY N/A 06/26/2018   Procedure: LAPAROSCOPIC CHOLECYSTECTOMY;  Surgeon: Sheliah Hatch De Blanch, MD;  Location: [facility];  Service: General;  Laterality: N/A;   COLONOSCOPY     COLONOSCOPY WITH PROPOFOL N/A 01/21/2020   Procedure: COLONOSCOPY WITH PROPOFOL;  Surgeon: Toledo, Boykin Nearing, MD;  Location: [facility];  Service: Gastroenterology;  Laterality: N/A;   ESOPHAGOGASTRODUODENOSCOPY (EGD) WITH PROPOFOL N/A 01/21/2020   Procedure: ESOPHAGOGASTRODUODENOSCOPY (EGD) WITH PROPOFOL;  Surgeon: Toledo, Boykin Nearing, MD;  Location: [facility];  Service: Gastroenterology;  Laterality: N/A;   MULTIPLE TOOTH EXTRACTIONS     TONSILLECTOMY     WISDOM TOOTH EXTRACTION      Social History   Socioeconomic History   Marital status: Widowed    Spouse name: Not on file   Number of children: Not on file   Years of education: Not on file   Highest education level:  Not on file  Occupational History   Occupation: retied  Tobacco Use   Smoking status: Never    Passive exposure: Yes   Smokeless tobacco: Never  Vaping Use   Vaping status: Never Used  Substance and Sexual Activity   Alcohol use: No   Drug use: No   Sexual activity: Not Currently  Other Topics Concern   Not on file  Social History Narrative   Not on file   Social Determinants of Health   Financial Resource Strain: Low Risk  (06/19/2018)   Overall Financial Resource Strain (CARDIA)    Difficulty of Paying Living Expenses: Not hard at all  Food Insecurity:  No Food Insecurity (07/20/2022)   Hunger Vital Sign    Worried About Running Out of Food in the Last Year: Never true    Ran Out of Food in the Last Year: Never true  Transportation Needs: No Transportation Needs (07/20/2022)   PRAPARE - Administrator, Civil Service (Medical): No    Lack of Transportation (Non-Medical): No  Physical Activity: Unknown (06/19/2018)   Exercise Vital Sign    Days of Exercise per Week: 2 days    Minutes of Exercise per Session: Not on file  Stress: No Stress Concern Present (06/19/2018)   [employer] of Occupational Health - Occupational Stress Questionnaire    Feeling of Stress : Only a little  Social Connections: Moderately Isolated (06/19/2018)   Social Connection and Isolation Panel [NHANES]    Frequency of Communication with Friends and Family: Not on file    Frequency of Social Gatherings with Friends and Family: More than three times a week    Attends Religious Services: Never    Database administrator or Organizations: No    Attends Banker Meetings: Never    Marital Status: Divorced  Catering manager Violence: Not At Risk (06/19/2018)   Humiliation, Afraid, Rape, and Kick questionnaire    Fear of Current or Ex-Partner: No    Emotionally Abused: No    Physically Abused: No    Sexually Abused: No    Family History  Problem Relation Age of Onset   Heart disease Mother    Heart failure Father    Breast cancer Neg Hx     Allergies  Allergen Reactions   Fire Ant Anaphylaxis   Gabapentin     Muscles jerks, weakness and difficulty swallowing - Noted in hospital stay   Albuterol Other (See Comments)    Pt reports seizures    Codeine Other (See Comments)    Altered mental status     Review of Systems  Constitutional: Negative.  Negative for chills, diaphoresis, fever, malaise/fatigue and weight loss.  HENT: Negative.  Negative for sore throat.   Eyes: Negative.   Respiratory: Negative.  Negative for cough and  shortness of breath.   Cardiovascular: Negative.  Negative for chest pain, palpitations and leg swelling.  Gastrointestinal: Negative.  Negative for abdominal pain, blood in stool, constipation, diarrhea, heartburn, melena, nausea and vomiting.  Genitourinary: Negative.  Negative for dysuria and flank pain.  Musculoskeletal: Negative.  Negative for joint pain and myalgias.  Skin: Negative.   Neurological: Negative.  Negative for dizziness, tingling, tremors and headaches.  Endo/Heme/Allergies: Negative.   Psychiatric/Behavioral: Negative.  Negative for depression and suicidal ideas. The patient is not nervous/anxious.        Objective:   BP (!) 150/94   Pulse 64   Ht 5\' 5"  (1.651 m)   Wt  167 lb 12.8 oz (76.1 kg)   SpO2 99%   BMI 27.92 kg/m   Vitals:   08/23/23 1406  BP: (!) 150/94  Pulse: 64  Height: 5\' 5"  (1.651 m)  Weight: 167 lb 12.8 oz (76.1 kg)  SpO2: 99%  BMI (Calculated): 27.92    Physical Exam Vitals and nursing note reviewed.  Constitutional:      Appearance: Normal appearance.  HENT:     Head: Normocephalic and atraumatic.     Nose: Nose normal.     Mouth/Throat:     Mouth: Mucous membranes are moist.     Pharynx: Oropharynx is clear.  Eyes:     Conjunctiva/sclera: Conjunctivae normal.     Pupils: Pupils are equal, round, and reactive to light.  Cardiovascular:     Rate and Rhythm: Normal rate and regular rhythm.     Pulses: Normal pulses.     Heart sounds: Normal heart sounds. No murmur heard. Pulmonary:     Effort: Pulmonary effort is normal.     Breath sounds: Normal breath sounds. No wheezing.  Abdominal:     General: Bowel sounds are normal.     Palpations: Abdomen is soft.     Tenderness: There is no abdominal tenderness. There is no right CVA tenderness or left CVA tenderness.  Musculoskeletal:        General: Normal range of motion.     Cervical back: Normal range of motion.     Right lower leg: No edema.     Left lower leg: No edema.   Skin:    General: Skin is warm and dry.  Neurological:     General: No focal deficit present.     Mental Status: She is alert and oriented to person, place, and time.  Psychiatric:        Mood and Affect: Mood normal.        Behavior: Behavior normal.      No results found for any visits on 08/23/23.  Recent Results (from the past 2160 hour(s))  POCT CBG (Fasting - Glucose)     Status: Abnormal   Collection Time: 06/25/23 11:30 AM  Result Value Ref Range   Glucose Fasting, POC 119 (A) 70 - 99 mg/dL  CBC with Diff     Status: None   Collection Time: 06/25/23 11:54 AM  Result Value Ref Range   WBC 7.2 3.4 - 10.8 x10E3/uL   RBC 3.77 3.77 - 5.28 x10E6/uL   Hemoglobin 11.9 11.1 - 15.9 g/dL   Hematocrit 30.0 92.3 - 46.6 %   MCV 94 79 - 97 fL   MCH 31.6 26.6 - 33.0 pg   MCHC 33.7 31.5 - 35.7 g/dL   RDW 30.0 76.2 - 26.3 %   Platelets 172 150 - 450 x10E3/uL   Neutrophils 73 Not Estab. %   Lymphs 18 Not Estab. %   Monocytes 6 Not Estab. %   Eos 2 Not Estab. %   Basos 1 Not Estab. %   Neutrophils Absolute 5.3 1.4 - 7.0 x10E3/uL   Lymphocytes Absolute 1.3 0.7 - 3.1 x10E3/uL   Monocytes Absolute 0.4 0.1 - 0.9 x10E3/uL   EOS (ABSOLUTE) 0.1 0.0 - 0.4 x10E3/uL   Basophils Absolute 0.0 0.0 - 0.2 x10E3/uL   Immature Granulocytes 0 Not Estab. %   Immature Grans (Abs) 0.0 0.0 - 0.1 x10E3/uL  CMP14+EGFR     Status: Abnormal   Collection Time: 06/25/23 11:54 AM  Result Value Ref Range   Glucose  111 (H) 70 - 99 mg/dL   BUN 17 8 - 27 mg/dL   Creatinine, Ser 4.09 (H) 0.57 - 1.00 mg/dL   eGFR 31 (L) >81 XB/JYN/8.29   BUN/Creatinine Ratio 10 (L) 12 - 28   Sodium 140 134 - 144 mmol/L   Potassium 3.6 3.5 - 5.2 mmol/L   Chloride 103 96 - 106 mmol/L   CO2 23 20 - 29 mmol/L   Calcium 9.2 8.7 - 10.3 mg/dL   Total Protein 6.2 6.0 - 8.5 g/dL   Albumin 4.3 3.8 - 4.8 g/dL   Globulin, Total 1.9 1.5 - 4.5 g/dL   Bilirubin Total 0.5 0.0 - 1.2 mg/dL   Alkaline Phosphatase 77 44 - 121 IU/L   AST  20 0 - 40 IU/L   ALT 16 0 - 32 IU/L  Lipid Panel w/o Chol/HDL Ratio     Status: Abnormal   Collection Time: 06/25/23 11:54 AM  Result Value Ref Range   Cholesterol, Total 156 100 - 199 mg/dL   Triglycerides 562 (H) 0 - 149 mg/dL   HDL 37 (L) >13 mg/dL   VLDL Cholesterol Cal 27 5 - 40 mg/dL   LDL Chol Calc (NIH) 92 0 - 99 mg/dL  Vitamin D (25 hydroxy)     Status: None   Collection Time: 06/25/23 11:54 AM  Result Value Ref Range   Vit D, 25-Hydroxy 44.9 30.0 - 100.0 ng/mL    Comment: Vitamin D deficiency has been defined by the Institute of Medicine and an Endocrine Society practice guideline as a level of serum 25-OH vitamin D less than 20 ng/mL (1,2). The Endocrine Society went on to further define vitamin D insufficiency as a level between 21 and 29 ng/mL (2). 1. IOM (Institute of Medicine). 2010. Dietary reference    intakes for calcium and D. [place]: The    [employer]. 2. Holick MF, Binkley Trumbull, Bischoff-Ferrari HA, et al.    Evaluation, treatment, and prevention of vitamin D    deficiency: an Endocrine Society clinical practice    guideline. JCEM. 2011 Jul; 96(7):1911-30.   Uric acid     Status: None   Collection Time: 06/25/23 11:54 AM  Result Value Ref Range   Uric Acid 5.1 3.1 - 7.9 mg/dL    Comment:            Therapeutic target for gout patients: <6.0  Vitamin B12     Status: None   Collection Time: 06/25/23 11:54 AM  Result Value Ref Range   Vitamin B-12 614 232 - 1,245 pg/mL  Resp panel by RT-PCR (RSV, Flu A&B, Covid) Anterior Nasal Swab     Status: None   Collection Time: 08/19/23  6:57 AM   Specimen: Anterior Nasal Swab  Result Value Ref Range   SARS Coronavirus 2 by RT PCR NEGATIVE NEGATIVE    Comment: (NOTE) SARS-CoV-2 target nucleic acids are NOT DETECTED.  The SARS-CoV-2 RNA is generally detectable in upper respiratory specimens during the acute phase of infection. The lowest concentration of SARS-CoV-2 viral copies this assay can  detect is 138 copies/mL. A negative result does not preclude SARS-Cov-2 infection and should not be used as the sole basis for treatment or other patient management decisions. A negative result may occur with  improper specimen collection/handling, submission of specimen other than nasopharyngeal swab, presence of viral mutation(s) within the areas targeted by this assay, and inadequate number of viral copies(<138 copies/mL). A negative result must be combined with clinical observations,  patient history, and epidemiological information. The expected result is Negative.  Fact Sheet for Patients:  BloggerCourse.com  Fact Sheet for Healthcare Providers:  SeriousBroker.it  This test is no t yet approved or cleared by the [place] FDA and  has been authorized for detection and/or diagnosis of SARS-CoV-2 by FDA under an Emergency Use Authorization (EUA). This EUA will remain  in effect (meaning this test can be used) for the duration of the COVID-19 declaration under Section 564(b)(1) of the Act, 21 U.S.C.section 360bbb-3(b)(1), unless the authorization is terminated  or revoked sooner.       Influenza A by PCR NEGATIVE NEGATIVE   Influenza B by PCR NEGATIVE NEGATIVE    Comment: (NOTE) The Xpert Xpress SARS-CoV-2/FLU/RSV plus assay is intended as an aid in the diagnosis of influenza from Nasopharyngeal swab specimens and should not be used as a sole basis for treatment. Nasal washings and aspirates are unacceptable for Xpert Xpress SARS-CoV-2/FLU/RSV testing.  Fact Sheet for Patients: BloggerCourse.com  Fact Sheet for Healthcare Providers: SeriousBroker.it  This test is not yet approved or cleared by the [place] FDA and has been authorized for detection and/or diagnosis of SARS-CoV-2 by FDA under an Emergency Use Authorization (EUA). This EUA will remain in effect  (meaning this test can be used) for the duration of the COVID-19 declaration under Section 564(b)(1) of the Act, 21 U.S.C. section 360bbb-3(b)(1), unless the authorization is terminated or revoked.     Resp Syncytial Virus by PCR NEGATIVE NEGATIVE    Comment: (NOTE) Fact Sheet for Patients: BloggerCourse.com  Fact Sheet for Healthcare Providers: SeriousBroker.it  This test is not yet approved or cleared by the [place] FDA and has been authorized for detection and/or diagnosis of SARS-CoV-2 by FDA under an Emergency Use Authorization (EUA). This EUA will remain in effect (meaning this test can be used) for the duration of the COVID-19 declaration under Section 564(b)(1) of the Act, 21 U.S.C. section 360bbb-3(b)(1), unless the authorization is terminated or revoked.  Performed at [facility], [address] [place], [place] [address]   CBC with Differential     Status: Abnormal   Collection Time: 08/19/23  8:22 AM  Result Value Ref Range   WBC 8.2 4.0 - 10.5 K/uL   RBC 3.75 (L) 3.87 - 5.11 MIL/uL   Hemoglobin 11.7 (L) 12.0 - 15.0 g/dL   HCT 73.7 (L) 10.6 - 26.9 %   MCV 91.2 80.0 - 100.0 fL   MCH 31.2 26.0 - 34.0 pg   MCHC 34.2 30.0 - 36.0 g/dL   RDW 48.5 46.2 - 70.3 %   Platelets 173 150 - 400 K/uL   nRBC 0.0 0.0 - 0.2 %   Neutrophils Relative % 62 %   Neutro Abs 5.1 1.7 - 7.7 K/uL   Lymphocytes Relative 27 %   Lymphs Abs 2.2 0.7 - 4.0 K/uL   Monocytes Relative 8 %   Monocytes Absolute 0.7 0.1 - 1.0 K/uL   Eosinophils Relative 2 %   Eosinophils Absolute 0.1 0.0 - 0.5 K/uL   Basophils Relative 1 %   Basophils Absolute 0.0 0.0 - 0.1 K/uL   Immature Granulocytes 0 %   Abs Immature Granulocytes 0.01 0.00 - 0.07 K/uL    Comment: Performed at [facility], [address] [place], [place] [address]  Basic metabolic panel     Status: Abnormal   Collection Time: 08/19/23  8:22 AM  Result Value Ref  Range   Sodium 138 135 -  145 mmol/L   Potassium 2.6 (LL) 3.5 - 5.1 mmol/L    Comment: CRITICAL RESULT CALLED TO, READ BACK BY AND VERIFIED WITH DEE MCCLAIN  08/19/23 @ 0904 BY SH    Chloride 105 98 - 111 mmol/L   CO2 20 (L) 22 - 32 mmol/L   Glucose, Bld 92 70 - 99 mg/dL    Comment: Glucose reference range applies only to samples taken after fasting for at least 8 hours.   BUN 29 (H) 8 - 23 mg/dL   Creatinine, Ser 8.11 (H) 0.44 - 1.00 mg/dL   Calcium 9.3 8.9 - 91.4 mg/dL   GFR, Estimated 27 (L) >60 mL/min    Comment: (NOTE) Calculated using the CKD-EPI Creatinine Equation (2021)    Anion gap 13 5 - 15    Comment: Performed at [facility], [address] [place], [place] [address]  Troponin I (High Sensitivity)     Status: Abnormal   Collection Time: 08/19/23  8:22 AM  Result Value Ref Range   Troponin I (High Sensitivity) 19 (H) <18 ng/L    Comment: (NOTE) Elevated high sensitivity troponin I (hsTnI) values and significant  changes across serial measurements may suggest ACS but many other  chronic and acute conditions are known to elevate hsTnI results.  Refer to the "Links" section for chest pain algorithms and additional  guidance. Performed at [facility], [address] [place], [place] [address]   Magnesium     Status: None   Collection Time: 08/19/23  8:22 AM  Result Value Ref Range   Magnesium 2.0 1.7 - 2.4 mg/dL    Comment: Performed at [facility], [address] [place], [place] [address]  Troponin I (High Sensitivity)     Status: Abnormal   Collection Time: 08/19/23  9:27 AM  Result Value Ref Range   Troponin I (High Sensitivity) 19 (H) <18 ng/L    Comment: (NOTE) Elevated high sensitivity troponin I (hsTnI) values and significant  changes across serial measurements may suggest ACS but many other  chronic and acute conditions are known to elevate hsTnI results.  Refer to the "Links" section for chest pain algorithms and  additional  guidance. Performed at [facility], [address], [place], [place] [address]       Assessment & Plan:  Continue current medications. Check labs today. Monitor blood pressure at home. Problem List Items Addressed This Visit     Essential hypertension, benign - Primary   Relevant Orders   CMP14+EGFR   Hyperlipidemia   Hypokalemia   Stage 3b chronic kidney disease (HCC)    Follow up 1 week.  Total time spent: 30 minutes  Margaretann Loveless, MD  08/23/2023   This document may have been prepared by [facility] Voice Recognition software and as such may include unintentional dictation errors.

## 2023-08-24 LAB — CMP14+EGFR
ALT: 14 [IU]/L (ref 0–32)
AST: 17 [IU]/L (ref 0–40)
Albumin: 4.2 g/dL (ref 3.8–4.8)
Alkaline Phosphatase: 76 [IU]/L (ref 44–121)
BUN/Creatinine Ratio: 10 — ABNORMAL LOW (ref 12–28)
BUN: 20 mg/dL (ref 8–27)
Bilirubin Total: 0.4 mg/dL (ref 0.0–1.2)
CO2: 23 mmol/L (ref 20–29)
Calcium: 9.5 mg/dL (ref 8.7–10.3)
Chloride: 102 mmol/L (ref 96–106)
Creatinine, Ser: 2.1 mg/dL — ABNORMAL HIGH (ref 0.57–1.00)
Globulin, Total: 2.3 g/dL (ref 1.5–4.5)
Glucose: 82 mg/dL (ref 70–99)
Potassium: 4.6 mmol/L (ref 3.5–5.2)
Sodium: 140 mmol/L (ref 134–144)
Total Protein: 6.5 g/dL (ref 6.0–8.5)
eGFR: 24 mL/min/{1.73_m2} — ABNORMAL LOW (ref >59)

## 2023-08-26 NOTE — Progress Notes (Signed)
Patient notified

## 2023-08-30 ENCOUNTER — Encounter: Payer: Self-pay | Admitting: Internal Medicine

## 2023-08-30 ENCOUNTER — Ambulatory Visit (INDEPENDENT_AMBULATORY_CARE_PROVIDER_SITE_OTHER): Payer: Medicare Other | Admitting: Internal Medicine

## 2023-08-30 ENCOUNTER — Ambulatory Visit: Payer: Medicare Other | Admitting: Internal Medicine

## 2023-08-30 VITALS — BP 176/90 | HR 69 | Ht 65.0 in | Wt 164.2 lb

## 2023-08-30 DIAGNOSIS — E782 Mixed hyperlipidemia: Secondary | ICD-10-CM | POA: Diagnosis not present

## 2023-08-30 DIAGNOSIS — N1832 Chronic kidney disease, stage 3b: Secondary | ICD-10-CM | POA: Diagnosis not present

## 2023-08-30 DIAGNOSIS — I10 Essential (primary) hypertension: Secondary | ICD-10-CM

## 2023-08-30 DIAGNOSIS — E049 Nontoxic goiter, unspecified: Secondary | ICD-10-CM | POA: Insufficient documentation

## 2023-08-30 DIAGNOSIS — K219 Gastro-esophageal reflux disease without esophagitis: Secondary | ICD-10-CM

## 2023-08-30 DIAGNOSIS — Z23 Encounter for immunization: Secondary | ICD-10-CM

## 2023-08-30 NOTE — Progress Notes (Signed)
Established Patient Office Visit  Subjective:  Patient ID: Elaine Norris, female    DOB: 1946/04/04  Age: 77 y.o. MRN: [id number]  Chief Complaint  Patient presents with   Follow-up    1 week follow up    Patient comes in for follow-up of her blood pressure which is actually higher today.  He denies headaches or dizziness, no chest pain and no palpitations.  She brought in all her medications to be checked. Will increase her hydralazine to 100 mg twice a day. Patient has history of goiter and recently she is having swallowing difficulties to both liquids and solids.  Her most recent TSH was within normal limits.  However we will schedule a thyroid ultrasound. Patient will also get flu vaccine today.    No other concerns at this time.   Past Medical History:  Diagnosis Date   Anxiety    Related to surgery   Arthritis    hands   Cancer (HCC) 1970's   cervical   Depression    Controlled   Esophagitis    GERD (gastroesophageal reflux disease)    Gout    Hyperlipidemia    Hypertension    Kidney carcinoma (HCC) 06/2020   Myocardial infarction (HCC)    Osteoporosis    feet   Pseudoseizure    Sleep apnea    CPAP    Past Surgical History:  Procedure Laterality Date   CARDIAC CATHETERIZATION  05/2011   [facility]   CARDIAC [facility]IZATION  09/2011   [facility]   CARDIAC CATHETERIZATION  2012   [facility]   CATARACT EXTRACTION W/PHACO Left 02/17/2018   Procedure: CATARACT EXTRACTION PHACO AND INTRAOCULAR LENS PLACEMENT (IOC) LEFT;  Surgeon: Lockie Mola, MD;  Location: [facility];  Service: Ophthalmology;  Laterality: Left;   CATARACT EXTRACTION W/PHACO Right 03/10/2018   Procedure: CATARACT EXTRACTION PHACO AND INTRAOCULAR LENS PLACEMENT (IOC) RIGHT  COMPLICATED;  Surgeon: Lockie Mola, MD;  Location: [facility];  Service: Ophthalmology;  Laterality: Right;  NEEDS EARLY AM 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 malyuhin   CHOLECYSTECTOMY N/A 06/26/2018    Procedure: LAPAROSCOPIC CHOLECYSTECTOMY;  Surgeon: Sheliah Hatch De Blanch, MD;  Location: [facility];  Service: General;  Laterality: N/A;   COLONOSCOPY     COLONOSCOPY WITH PROPOFOL N/A 01/21/2020   Procedure: COLONOSCOPY WITH PROPOFOL;  Surgeon: Toledo, Boykin Nearing, MD;  Location: [facility];  Service: Gastroenterology;  Laterality: N/A;   ESOPHAGOGASTRODUODENOSCOPY (EGD) WITH PROPOFOL N/A 01/21/2020   Procedure: ESOPHAGOGASTRODUODENOSCOPY (EGD) WITH PROPOFOL;  Surgeon: Toledo, Boykin Nearing, MD;  Location: [facility];  Service: Gastroenterology;  Laterality: N/A;   MULTIPLE TOOTH EXTRACTIONS     TONSILLECTOMY     WISDOM TOOTH EXTRACTION      Social History   Socioeconomic History   Marital status: Widowed    Spouse name: Not on file   Number of children: Not on file   Years of education: Not on file   Highest education level: Not on file  Occupational History   Occupation: retied  Tobacco Use   Smoking status: Never    Passive exposure: Yes   Smokeless tobacco: Never  Vaping Use   Vaping status: Never Used  Substance and Sexual Activity   Alcohol use: No   Drug use: No   Sexual activity: Not Currently  Other Topics Concern   Not on file  Social History Narrative   Not on file   Social Determinants of Health   Financial Resource Strain: Low Risk  (06/19/2018)  Overall Financial Resource Strain (CARDIA)    Difficulty of Paying Living Expenses: Not hard at all  Food Insecurity: No Food Insecurity (07/20/2022)   Hunger Vital Sign    Worried About Running Out of Food in the Last Year: Never true    Ran Out of Food in the Last Year: Never true  Transportation Needs: No Transportation Needs (07/20/2022)   PRAPARE - Administrator, Civil Service (Medical): No    Lack of Transportation (Non-Medical): No  Physical Activity: Unknown (06/19/2018)   Exercise Vital Sign    Days of Exercise per Week: 2 days    Minutes of Exercise per Session: Not on file  Stress: No Stress  Concern Present (06/19/2018)   [employer] of Occupational Health - Occupational Stress Questionnaire    Feeling of Stress : Only a little  Social Connections: Moderately Isolated (06/19/2018)   Social Connection and Isolation Panel [NHANES]    Frequency of Communication with Friends and Family: Not on file    Frequency of Social Gatherings with Friends and Family: More than three times a week    Attends Religious Services: Never    Database administrator or Organizations: No    Attends Banker Meetings: Never    Marital Status: Divorced  Catering manager Violence: Not At Risk (06/19/2018)   Humiliation, Afraid, Rape, and Kick questionnaire    Fear of Current or Ex-Partner: No    Emotionally Abused: No    Physically Abused: No    Sexually Abused: No    Family History  Problem Relation Age of Onset   Heart disease Mother    Heart failure Father    Breast cancer Neg Hx     Allergies  Allergen Reactions   Fire Ant Anaphylaxis   Gabapentin     Muscles jerks, weakness and difficulty swallowing - Noted in hospital stay   Albuterol Other (See Comments)    Pt reports seizures    Codeine Other (See Comments)    Altered mental status     Review of Systems  Constitutional: Negative.  Negative for chills, diaphoresis, fever, malaise/fatigue and weight loss.  HENT: Negative.  Negative for congestion and sore throat.   Eyes: Negative.   Respiratory: Negative.  Negative for cough and shortness of breath.   Cardiovascular: Negative.  Negative for chest pain, palpitations and leg swelling.  Gastrointestinal: Negative.  Negative for abdominal pain, constipation, diarrhea, heartburn, nausea and vomiting.  Genitourinary: Negative.  Negative for dysuria and flank pain.  Musculoskeletal: Negative.  Negative for joint pain and myalgias.  Skin: Negative.   Neurological: Negative.  Negative for dizziness and headaches.  Endo/Heme/Allergies: Negative.   Psychiatric/Behavioral:  Negative.  Negative for depression and suicidal ideas. The patient is not nervous/anxious.        Objective:   BP (!) 176/90   Pulse 69   Ht 5\' 5"  (1.651 m)   Wt 164 lb 3.2 oz (74.5 kg)   SpO2 98%   BMI 27.32 kg/m   Vitals:   08/30/23 1432  BP: (!) 176/90  Pulse: 69  Height: 5\' 5"  (1.651 m)  Weight: 164 lb 3.2 oz (74.5 kg)  SpO2: 98%  BMI (Calculated): 27.32    Physical Exam Vitals and nursing note reviewed.  Constitutional:      Appearance: Normal appearance.  HENT:     Head: Normocephalic and atraumatic.     Nose: Nose normal.     Mouth/Throat:     Mouth: Mucous  membranes are moist.     Pharynx: Oropharynx is clear.  Eyes:     Conjunctiva/sclera: Conjunctivae normal.     Pupils: Pupils are equal, round, and reactive to light.  Cardiovascular:     Rate and Rhythm: Normal rate and regular rhythm.     Pulses: Normal pulses.     Heart sounds: Normal heart sounds. No murmur heard. Pulmonary:     Effort: Pulmonary effort is normal.     Breath sounds: Normal breath sounds. No wheezing.  Abdominal:     General: Bowel sounds are normal.     Palpations: Abdomen is soft.     Tenderness: There is no abdominal tenderness. There is no right CVA tenderness or left CVA tenderness.  Musculoskeletal:        General: Normal range of motion.     Cervical back: Normal range of motion.     Right lower leg: No edema.     Left lower leg: No edema.  Skin:    General: Skin is warm and dry.  Neurological:     General: No focal deficit present.     Mental Status: She is alert and oriented to person, place, and time.  Psychiatric:        Mood and Affect: Mood normal.        Behavior: Behavior normal.      No results found for any visits on 08/30/23.  Recent Results (from the past 2160 hour(s))  POCT CBG (Fasting - Glucose)     Status: Abnormal   Collection Time: 06/25/23 11:30 AM  Result Value Ref Range   Glucose Fasting, POC 119 (A) 70 - 99 mg/dL  CBC with Diff      Status: None   Collection Time: 06/25/23 11:54 AM  Result Value Ref Range   WBC 7.2 3.4 - 10.8 x10E3/uL   RBC 3.77 3.77 - 5.28 x10E6/uL   Hemoglobin 11.9 11.1 - 15.9 g/dL   Hematocrit 40.3 47.4 - 46.6 %   MCV 94 79 - 97 fL   MCH 31.6 26.6 - 33.0 pg   MCHC 33.7 31.5 - 35.7 g/dL   RDW 25.9 56.3 - 87.5 %   Platelets 172 150 - 450 x10E3/uL   Neutrophils 73 Not Estab. %   Lymphs 18 Not Estab. %   Monocytes 6 Not Estab. %   Eos 2 Not Estab. %   Basos 1 Not Estab. %   Neutrophils Absolute 5.3 1.4 - 7.0 x10E3/uL   Lymphocytes Absolute 1.3 0.7 - 3.1 x10E3/uL   Monocytes Absolute 0.4 0.1 - 0.9 x10E3/uL   EOS (ABSOLUTE) 0.1 0.0 - 0.4 x10E3/uL   Basophils Absolute 0.0 0.0 - 0.2 x10E3/uL   Immature Granulocytes 0 Not Estab. %   Immature Grans (Abs) 0.0 0.0 - 0.1 x10E3/uL  CMP14+EGFR     Status: Abnormal   Collection Time: 06/25/23 11:54 AM  Result Value Ref Range   Glucose 111 (H) 70 - 99 mg/dL   BUN 17 8 - 27 mg/dL   Creatinine, Ser 6.43 (H) 0.57 - 1.00 mg/dL   eGFR 31 (L) >32 RJ/JOA/4.16   BUN/Creatinine Ratio 10 (L) 12 - 28   Sodium 140 134 - 144 mmol/L   Potassium 3.6 3.5 - 5.2 mmol/L   Chloride 103 96 - 106 mmol/L   CO2 23 20 - 29 mmol/L   Calcium 9.2 8.7 - 10.3 mg/dL   Total Protein 6.2 6.0 - 8.5 g/dL   Albumin 4.3 3.8 - 4.8 g/dL  Globulin, Total 1.9 1.5 - 4.5 g/dL   Bilirubin Total 0.5 0.0 - 1.2 mg/dL   Alkaline Phosphatase 77 44 - 121 IU/L   AST 20 0 - 40 IU/L   ALT 16 0 - 32 IU/L  Lipid Panel w/o Chol/HDL Ratio     Status: Abnormal   Collection Time: 06/25/23 11:54 AM  Result Value Ref Range   Cholesterol, Total 156 100 - 199 mg/dL   Triglycerides 829 (H) 0 - 149 mg/dL   HDL 37 (L) >56 mg/dL   VLDL Cholesterol Cal 27 5 - 40 mg/dL   LDL Chol Calc (NIH) 92 0 - 99 mg/dL  Vitamin D (25 hydroxy)     Status: None   Collection Time: 06/25/23 11:54 AM  Result Value Ref Range   Vit D, 25-Hydroxy 44.9 30.0 - 100.0 ng/mL    Comment: Vitamin D deficiency has been defined by  the Institute of Medicine and an Endocrine Society practice guideline as a level of serum 25-OH vitamin D less than 20 ng/mL (1,2). The Endocrine Society went on to further define vitamin D insufficiency as a level between 21 and 29 ng/mL (2). 1. IOM (Institute of Medicine). 2010. Dietary reference    intakes for calcium and D. [place]: The    [employer]. 2. Holick MF, Binkley [place], Bischoff-Ferrari HA, et al.    Evaluation, treatment, and prevention of vitamin D    deficiency: an Endocrine Society clinical practice    guideline. JCEM. 2011 Jul; 96(7):1911-30.   Uric acid     Status: None   Collection Time: 06/25/23 11:54 AM  Result Value Ref Range   Uric Acid 5.1 3.1 - 7.9 mg/dL    Comment:            Therapeutic target for gout patients: <6.0  Vitamin B12     Status: None   Collection Time: 06/25/23 11:54 AM  Result Value Ref Range   Vitamin B-12 614 232 - 1,245 pg/mL  Resp panel by RT-PCR (RSV, Flu A&B, Covid) Anterior Nasal Swab     Status: None   Collection Time: 08/19/23  6:57 AM   Specimen: Anterior Nasal Swab  Result Value Ref Range   SARS Coronavirus 2 by RT PCR NEGATIVE NEGATIVE    Comment: (NOTE) SARS-CoV-2 target nucleic acids are NOT DETECTED.  The SARS-CoV-2 RNA is generally detectable in upper respiratory specimens during the acute phase of infection. The lowest concentration of SARS-CoV-2 viral copies this assay can detect is 138 copies/mL. A negative result does not preclude SARS-Cov-2 infection and should not be used as the sole basis for treatment or other patient management decisions. A negative result may occur with  improper specimen collection/handling, submission of specimen other than nasopharyngeal swab, presence of viral mutation(s) within the areas targeted by this assay, and inadequate number of viral copies(<138 copies/mL). A negative result must be combined with clinical observations, patient history, and  epidemiological information. The expected result is Negative.  Fact Sheet for Patients:  BloggerCourse.com  Fact Sheet for Healthcare Providers:  SeriousBroker.it  This test is no t yet approved or cleared by the [place] FDA and  has been authorized for detection and/or diagnosis of SARS-CoV-2 by FDA under an Emergency Use Authorization (EUA). This EUA will remain  in effect (meaning this test can be used) for the duration of the COVID-19 declaration under Section 564(b)(1) of the Act, 21 U.S.C.section 360bbb-3(b)(1), unless the authorization is terminated  or revoked sooner.  Influenza A by PCR NEGATIVE NEGATIVE   Influenza B by PCR NEGATIVE NEGATIVE    Comment: (NOTE) The Xpert Xpress SARS-CoV-2/FLU/RSV plus assay is intended as an aid in the diagnosis of influenza from Nasopharyngeal swab specimens and should not be used as a sole basis for treatment. Nasal washings and aspirates are unacceptable for Xpert Xpress SARS-CoV-2/FLU/RSV testing.  Fact Sheet for Patients: BloggerCourse.com  Fact Sheet for Healthcare Providers: SeriousBroker.it  This test is not yet approved or cleared by the [place] FDA and has been authorized for detection and/or diagnosis of SARS-CoV-2 by FDA under an Emergency Use Authorization (EUA). This EUA will remain in effect (meaning this test can be used) for the duration of the COVID-19 declaration under Section 564(b)(1) of the Act, 21 U.S.C. section 360bbb-3(b)(1), unless the authorization is terminated or revoked.     Resp Syncytial Virus by PCR NEGATIVE NEGATIVE    Comment: (NOTE) Fact Sheet for Patients: BloggerCourse.com  Fact Sheet for Healthcare Providers: SeriousBroker.it  This test is not yet approved or cleared by the [place] FDA and has been authorized for  detection and/or diagnosis of SARS-CoV-2 by FDA under an Emergency Use Authorization (EUA). This EUA will remain in effect (meaning this test can be used) for the duration of the COVID-19 declaration under Section 564(b)(1) of the Act, 21 U.S.C. section 360bbb-3(b)(1), unless the authorization is terminated or revoked.  Performed at [facility], [address] [place], [place] [address]   CBC with Differential     Status: Abnormal   Collection Time: 08/19/23  8:22 AM  Result Value Ref Range   WBC 8.2 4.0 - 10.5 K/uL   RBC 3.75 (L) 3.87 - 5.11 MIL/uL   Hemoglobin 11.7 (L) 12.0 - 15.0 g/dL   HCT 60.4 (L) 54.0 - 98.1 %   MCV 91.2 80.0 - 100.0 fL   MCH 31.2 26.0 - 34.0 pg   MCHC 34.2 30.0 - 36.0 g/dL   RDW 19.1 47.8 - 29.5 %   Platelets 173 150 - 400 K/uL   nRBC 0.0 0.0 - 0.2 %   Neutrophils Relative % 62 %   Neutro Abs 5.1 1.7 - 7.7 K/uL   Lymphocytes Relative 27 %   Lymphs Abs 2.2 0.7 - 4.0 K/uL   Monocytes Relative 8 %   Monocytes Absolute 0.7 0.1 - 1.0 K/uL   Eosinophils Relative 2 %   Eosinophils Absolute 0.1 0.0 - 0.5 K/uL   Basophils Relative 1 %   Basophils Absolute 0.0 0.0 - 0.1 K/uL   Immature Granulocytes 0 %   Abs Immature Granulocytes 0.01 0.00 - 0.07 K/uL    Comment: Performed at [facility], [address] [place], [place] [address]  Basic metabolic panel     Status: Abnormal   Collection Time: 08/19/23  8:22 AM  Result Value Ref Range   Sodium 138 135 - 145 mmol/L   Potassium 2.6 (LL) 3.5 - 5.1 mmol/L    Comment: CRITICAL RESULT CALLED TO, READ BACK BY AND VERIFIED WITH DEE MCCLAIN  08/19/23 @ 0904 BY SH    Chloride 105 98 - 111 mmol/L   CO2 20 (L) 22 - 32 mmol/L   Glucose, Bld 92 70 - 99 mg/dL    Comment: Glucose reference range applies only to samples taken after fasting for at least 8 hours.   BUN 29 (H) 8 - 23 mg/dL   Creatinine, Ser 8.65 (H) 0.44 - 1.00 mg/dL   Calcium 9.3 8.9 -  10.3 mg/dL   GFR, Estimated 27 (L) >60 mL/min     Comment: (NOTE) Calculated using the CKD-EPI Creatinine Equation (2021)    Anion gap 13 5 - 15    Comment: Performed at [facility], [address] [place], [place] [address]  Troponin I (High Sensitivity)     Status: Abnormal   Collection Time: 08/19/23  8:22 AM  Result Value Ref Range   Troponin I (High Sensitivity) 19 (H) <18 ng/L    Comment: (NOTE) Elevated high sensitivity troponin I (hsTnI) values and significant  changes across serial measurements may suggest ACS but many other  chronic and acute conditions are known to elevate hsTnI results.  Refer to the "Links" section for chest pain algorithms and additional  guidance. Performed at [facility], [address] [place], [place] [address]   Magnesium     Status: None   Collection Time: 08/19/23  8:22 AM  Result Value Ref Range   Magnesium 2.0 1.7 - 2.4 mg/dL    Comment: Performed at [facility], [address] [place], [place] [address]  Troponin I (High Sensitivity)     Status: Abnormal   Collection Time: 08/19/23  9:27 AM  Result Value Ref Range   Troponin I (High Sensitivity) 19 (H) <18 ng/L    Comment: (NOTE) Elevated high sensitivity troponin I (hsTnI) values and significant  changes across serial measurements may suggest ACS but many other  chronic and acute conditions are known to elevate hsTnI results.  Refer to the "Links" section for chest pain algorithms and additional  guidance. Performed at [facility], [address] [place], [place] [address]   [id number]     Status: Abnormal   Collection Time: 08/23/23  2:34 PM  Result Value Ref Range   Glucose 82 70 - 99 mg/dL   BUN 20 8 - 27 mg/dL   Creatinine, Ser 7.42 (H) 0.57 - 1.00 mg/dL   eGFR 24 (L) >59 DG/LOV/5.64   BUN/Creatinine Ratio 10 (L) 12 - 28   Sodium 140 134 - 144 mmol/L   Potassium 4.6 3.5 - 5.2 mmol/L   Chloride 102 96 - 106 mmol/L   CO2 23 20 - 29 mmol/L   Calcium 9.5 8.7 - 10.3 mg/dL    Total Protein 6.5 6.0 - 8.5 g/dL   Albumin 4.2 3.8 - 4.8 g/dL   Globulin, Total 2.3 1.5 - 4.5 g/dL   Bilirubin Total 0.4 0.0 - 1.2 mg/dL   Alkaline Phosphatase 76 44 - 121 IU/L   AST 17 0 - 40 IU/L   ALT 14 0 - 32 IU/L      Assessment & Plan:  Increase hydralazine to 100 mg twice a day, she can take 2 of her 50 mg tablets until finished. Schedule thyroid ultrasound for goiter. Flu vaccine today. Problem List Items Addressed This Visit     Essential hypertension, benign - Primary   Hyperlipidemia   GERD (gastroesophageal reflux disease)   Stage 3b chronic kidney disease (HCC)   Goiter   Relevant Orders   US THYROID   Other Visit Diagnoses     Need for immunization against influenza       Relevant Orders   Flu Vaccine Trivalent High Dose (Fluad) (Completed)       Return in about 2 weeks (around 09/13/2023).   Total time spent: 30 minutes  Margaretann Loveless, MD  08/30/2023   This document may have been prepared by [facility] Voice Recognition software  and as such may include unintentional dictation errors.

## 2023-09-05 ENCOUNTER — Encounter: Payer: Self-pay | Admitting: Endocrinology

## 2023-09-05 ENCOUNTER — Ambulatory Visit: Payer: Medicare Other | Admitting: Endocrinology

## 2023-09-05 VITALS — BP 186/90 | HR 64 | Resp 20 | Ht 65.0 in | Wt 166.2 lb

## 2023-09-05 DIAGNOSIS — M810 Age-related osteoporosis without current pathological fracture: Secondary | ICD-10-CM

## 2023-09-05 NOTE — Patient Instructions (Signed)
Will continue on Porlia every 6 months.  Continue calcium and vit D.  Ask to fax DEXA scan report to our clinic.

## 2023-09-07 ENCOUNTER — Encounter: Payer: Self-pay | Admitting: Endocrinology

## 2023-09-11 ENCOUNTER — Ambulatory Visit (INDEPENDENT_AMBULATORY_CARE_PROVIDER_SITE_OTHER): Payer: Medicare Other

## 2023-09-11 ENCOUNTER — Telehealth: Payer: Self-pay

## 2023-09-11 DIAGNOSIS — E049 Nontoxic goiter, unspecified: Secondary | ICD-10-CM | POA: Diagnosis not present

## 2023-09-11 MED ORDER — DENOSUMAB 60 MG/ML ~~LOC~~ SOSY
60.0000 mg | PREFILLED_SYRINGE | Freq: Once | SUBCUTANEOUS | Status: DC
Start: 2023-09-25 — End: 2024-04-20

## 2023-09-11 NOTE — Addendum Note (Signed)
Addended by: Tera Partridge on: 09/11/2023 09:57 AM   Modules accepted: Orders

## 2023-09-11 NOTE — Telephone Encounter (Signed)
Order has been placed for Prolia new start

## 2023-09-12 NOTE — Telephone Encounter (Signed)
Prolia VOB initiated via [email or web address]  Next Prolia inj DUE: new start

## 2023-09-17 ENCOUNTER — Encounter: Payer: Self-pay | Admitting: Internal Medicine

## 2023-09-17 ENCOUNTER — Ambulatory Visit (INDEPENDENT_AMBULATORY_CARE_PROVIDER_SITE_OTHER): Payer: Medicare Other | Admitting: Internal Medicine

## 2023-09-17 VITALS — BP 146/86 | HR 69 | Ht 65.0 in | Wt 167.0 lb

## 2023-09-17 DIAGNOSIS — I10 Essential (primary) hypertension: Secondary | ICD-10-CM | POA: Diagnosis not present

## 2023-09-17 DIAGNOSIS — K219 Gastro-esophageal reflux disease without esophagitis: Secondary | ICD-10-CM

## 2023-09-17 DIAGNOSIS — N1832 Chronic kidney disease, stage 3b: Secondary | ICD-10-CM | POA: Diagnosis not present

## 2023-09-17 DIAGNOSIS — E782 Mixed hyperlipidemia: Secondary | ICD-10-CM

## 2023-09-17 MED ORDER — HYDRALAZINE HCL 100 MG PO TABS: 100.0000 mg | ORAL_TABLET | Freq: Three times a day (TID) | ORAL | 3 refills | Status: DC

## 2023-09-17 NOTE — Progress Notes (Signed)
Established Patient Office Visit  Subjective:  Patient ID: Elaine Norris, female    DOB: 06/06/1946  Age: 77 y.o. MRN: [id number]  Chief Complaint  Patient presents with   Follow-up    3 month follow up    Patient comes in for follow-up of her blood pressure today.  Although it is better than before but it is still high, since the addition of hydralazine.  Patient admits to eating extra salt these days and promises to stop doing it.  Meanwhile we will increase her hydralazine to 100 mg 3 times a day.  Will continue to monitor blood pressure.    No other concerns at this time.   Past Medical History:  Diagnosis Date   Anxiety    Related to surgery   Arthritis    hands   Cancer (HCC) 1970's   cervical   Depression    Controlled   Esophagitis    GERD (gastroesophageal reflux disease)    Gout    Hyperlipidemia    Hypertension    Kidney carcinoma (HCC) 06/2020   Myocardial infarction (HCC)    Osteoporosis    feet   Pseudoseizure    Sleep apnea    CPAP    Past Surgical History:  Procedure Laterality Date   CARDIAC CATHETERIZATION  05/2011   [facility]   [facility]  09/2011   [facility]   CARD[facility]AC [facility]IZATION  2012   [facility]   CATARACT EXTRACTION W/PHACO Left 02/17/2018   Procedure: CATARACT EXTRACTION PHACO AND INTRAOCULAR LENS PLACEMENT (IOC) LEFT;  Surgeon: Lockie Mola, MD;  Location: [facility];  Service: Ophthalmology;  Laterality: Left;   CATARACT EXTRACTION W/PHACO Right 03/10/2018   Procedure: CATARACT EXTRACTION PHACO AND INTRAOCULAR LENS PLACEMENT (IOC) RIGHT  COMPLICATED;  Surgeon: Lockie Mola, MD;  Location: [facility];  Service: Ophthalmology;  Laterality: Right;  NEEDS EARLY AM 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 malyuhin   CHOLECYSTECTOMY N/A 06/26/2018   Procedure: LAPAROSCOPIC CHOLECYSTECTOMY;  Surgeon: Sheliah Hatch De Blanch, MD;  Location: [facility];  Service: General;  Laterality: N/A;   COLONOSCOPY     COLONOSCOPY  WITH PROPOFOL N/A 01/21/2020   Procedure: COLONOSCOPY WITH PROPOFOL;  Surgeon: Toledo, Boykin Nearing, MD;  Location: [facility];  Service: Gastroenterology;  Laterality: N/A;   ESOPHAGOGASTRODUODENOSCOPY (EGD) WITH PROPOFOL N/A 01/21/2020   Procedure: ESOPHAGOGASTRODUODENOSCOPY (EGD) WITH PROPOFOL;  Surgeon: Toledo, Boykin Nearing, MD;  Location: [facility];  Service: Gastroenterology;  Laterality: N/A;   MULTIPLE TOOTH EXTRACTIONS     TONSILLECTOMY     WISDOM TOOTH EXTRACTION      Social History   Socioeconomic History   Marital status: Widowed    Spouse name: Not on file   Number of children: Not on file   Years of education: Not on file   Highest education level: Not on file  Occupational History   Occupation: retied  Tobacco Use   Smoking status: Never    Passive exposure: Yes   Smokeless tobacco: Never  Vaping Use   Vaping status: Never Used  Substance and Sexual Activity   Alcohol use: No   Drug use: No   Sexual activity: Not Currently  Other Topics Concern   Not on file  Social History Narrative   Not on file   Social Determinants of Health   Financial Resource Strain: Low Risk  (06/19/2018)   Overall Financial Resource Strain (CARDIA)    Difficulty of Paying Living Expenses: Not hard at all  Food Insecurity: No Food Insecurity (  07/20/2022)   Hunger Vital Sign    Worried About Running Out of Food in the Last Year: Never true    Ran Out of Food in the Last Year: Never true  Transportation Needs: No Transportation Needs (07/20/2022)   PRAPARE - Administrator, Civil Service (Medical): No    Lack of Transportation (Non-Medical): No  Physical Activity: Unknown (06/19/2018)   Exercise Vital Sign    Days of Exercise per Week: 2 days    Minutes of Exercise per Session: Not on file  Stress: No Stress Concern Present (06/19/2018)   [employer] of Occupational Health - Occupational Stress Questionnaire    Feeling of Stress : Only a little  Social Connections:  Moderately Isolated (06/19/2018)   Social Connection and Isolation Panel [NHANES]    Frequency of Communication with Friends and Family: Not on file    Frequency of Social Gatherings with Friends and Family: More than three times a week    Attends Religious Services: Never    Database administrator or Organizations: No    Attends Banker Meetings: Never    Marital Status: Divorced  Catering manager Violence: Not At Risk (06/19/2018)   Humiliation, Afraid, Rape, and Kick questionnaire    Fear of Current or Ex-Partner: No    Emotionally Abused: No    Physically Abused: No    Sexually Abused: No    Family History  Problem Relation Age of Onset   Heart disease Mother    Heart failure Father    Breast cancer Neg Hx     Allergies  Allergen Reactions   Fire Ant Anaphylaxis   Gabapentin     Muscles jerks, weakness and difficulty swallowing - Noted in hospital stay   Albuterol Other (See Comments)    Pt reports seizures    Codeine Other (See Comments)    Altered mental status     Review of Systems  Constitutional: Negative.  Negative for chills, diaphoresis, fever, malaise/fatigue and weight loss.  HENT: Negative.  Negative for hearing loss.   Eyes: Negative.   Respiratory: Negative.  Negative for cough and shortness of breath.   Cardiovascular: Negative.  Negative for chest pain, palpitations and leg swelling.  Gastrointestinal: Negative.  Negative for abdominal pain, constipation, diarrhea, heartburn, nausea and vomiting.  Genitourinary: Negative.  Negative for dysuria and flank pain.  Musculoskeletal: Negative.  Negative for joint pain and myalgias.  Skin: Negative.   Neurological: Negative.  Negative for dizziness and headaches.  Endo/Heme/Allergies: Negative.   Psychiatric/Behavioral: Negative.  Negative for depression and suicidal ideas. The patient is not nervous/anxious.      Objective:   BP (!) 146/86   Pulse 69   Ht 5\' 5"  (1.651 m)   Wt 167 lb (75.8  kg)   SpO2 98%   BMI 27.79 kg/m   Vitals:   09/17/23 1112  BP: (!) 146/86  Pulse: 69  Height: 5\' 5"  (1.651 m)  Weight: 167 lb (75.8 kg)  SpO2: 98%  BMI (Calculated): 27.79    Physical Exam Vitals and nursing note reviewed.  Constitutional:      Appearance: Normal appearance.  HENT:     Head: Normocephalic and atraumatic.     Nose: Nose normal.     Mouth/Throat:     Mouth: Mucous membranes are moist.     Pharynx: Oropharynx is clear.  Eyes:     Conjunctiva/sclera: Conjunctivae normal.     Pupils: Pupils are equal, round, and reactive  to light.  Cardiovascular:     Rate and Rhythm: Normal rate and regular rhythm.     Pulses: Normal pulses.     Heart sounds: Normal heart sounds. No murmur heard. Pulmonary:     Effort: Pulmonary effort is normal.     Breath sounds: Normal breath sounds. No wheezing.  Abdominal:     General: Bowel sounds are normal.     Palpations: Abdomen is soft.     Tenderness: There is no abdominal tenderness. There is no right CVA tenderness or left CVA tenderness.  Musculoskeletal:        General: Normal range of motion.     Cervical back: Normal range of motion.     Right lower leg: No edema.     Left lower leg: No edema.  Skin:    General: Skin is warm and dry.  Neurological:     General: No focal deficit present.     Mental Status: She is alert and oriented to person, place, and time.  Psychiatric:        Mood and Affect: Mood normal.        Behavior: Behavior normal.      No results found for any visits on 09/17/23.      Assessment & Plan:  Increase hydralazine to 100 mg 3 times a day.  To continue all the rest of her medications as such. Strict diet control emphasized. Problem List Items Addressed This Visit     Essential hypertension, benign - Primary   Relevant Medications   hydrALAZINE (APRESOLINE) 100 MG tablet   Hyperlipidemia   Relevant Medications   hydrALAZINE (APRESOLINE) 100 MG tablet   GERD (gastroesophageal  reflux disease)   Stage 3b chronic kidney disease (HCC)    Return in about 1 month (around 10/17/2023).   Total time spent: 30 minutes  Margaretann Loveless, MD  09/17/2023   This document may have been prepared by [facility] Voice Recognition software and as such may include unintentional dictation errors.

## 2023-09-17 NOTE — Progress Notes (Signed)
Patient notified

## 2023-09-20 ENCOUNTER — Encounter: Payer: Self-pay | Admitting: Cardiovascular Disease

## 2023-09-20 ENCOUNTER — Ambulatory Visit: Payer: Medicare Other | Admitting: Cardiovascular Disease

## 2023-09-20 VITALS — BP 142/82 | HR 65 | Ht 65.0 in | Wt 166.0 lb

## 2023-09-20 DIAGNOSIS — I340 Nonrheumatic mitral (valve) insufficiency: Secondary | ICD-10-CM

## 2023-09-20 DIAGNOSIS — R42 Dizziness and giddiness: Secondary | ICD-10-CM | POA: Diagnosis not present

## 2023-09-20 DIAGNOSIS — I10 Essential (primary) hypertension: Secondary | ICD-10-CM

## 2023-09-20 DIAGNOSIS — N1832 Chronic kidney disease, stage 3b: Secondary | ICD-10-CM

## 2023-09-20 DIAGNOSIS — E782 Mixed hyperlipidemia: Secondary | ICD-10-CM | POA: Diagnosis not present

## 2023-09-20 NOTE — Progress Notes (Signed)
Cardiology Office Note   Date:  09/20/2023   ID:  Elaine Norris, Elaine Norris 1946/11/12, MRN [id number]  PCP:  Margaretann Loveless, MD  Cardiologist:  Adrian Blackwater, MD      History of Present Illness: Elaine Norris is a 77 y.o. female who presents for  Chief Complaint  Patient presents with   Follow-up    Doing well      Past Medical History:  Diagnosis Date   Anxiety    Related to surgery   Arthritis    hands   Cancer [facility]) 1970's   cervical   Depression    Controlled   Esophagitis    GERD (gastroesophageal reflux disease)    Gout    Hyperlipidemia    Hypertension    Kidney carcinoma (HCC) 06/2020   Myocardial infarction (HCC)    Osteoporosis    feet   Pseudoseizure    Sleep apnea    CPAP     Past Surgical History:  Procedure Laterality Date   CARDIAC CATHETERIZATION  05/2011   [facility]   CARD[facility]AC [facility]  09/2011   [facility]   CARDIAC [facility]IZATION  2012   [facility]   CATARACT EXTRACTION W/PHACO Left 02/17/2018   Procedure: CATARACT EXTRACTION PHACO AND INTRAOCULAR LENS PLACEMENT (IOC) LEFT;  Surgeon: Lockie Mola, MD;  Location: [facility];  Service: Ophthalmology;  Laterality: Left;   CATARACT EXTRACTION W/PHACO Right 03/10/2018   Procedure: CATARACT EXTRACTION PHACO AND INTRAOCULAR LENS PLACEMENT (IOC) RIGHT  COMPLICATED;  Surgeon: Lockie Mola, MD;  Location: [facility];  Service: Ophthalmology;  Laterality: Right;  NEEDS EARLY AM 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 malyuhin   CHOLECYSTECTOMY N/A 06/26/2018   Procedure: LAPAROSCOPIC CHOLECYSTECTOMY;  Surgeon: Sheliah Hatch De Blanch, MD;  Location: [facility];  Service: General;  Laterality: N/A;   COLONOSCOPY     COLONOSCOPY WITH PROPOFOL N/A 01/21/2020   Procedure: COLONOSCOPY WITH PROPOFOL;  Surgeon: Toledo, Boykin Nearing, MD;  Location: [facility];  Service: Gastroenterology;  Laterality: N/A;   ESOPHAGOGASTRODUODENOSCOPY (EGD) WITH PROPOFOL N/A 01/21/2020   Procedure:  ESOPHAGOGASTRODUODENOSCOPY (EGD) WITH PROPOFOL;  Surgeon: Toledo, Boykin Nearing, MD;  Location: [facility];  Service: Gastroenterology;  Laterality: N/A;   MULTIPLE TOOTH EXTRACTIONS     TONSILLECTOMY     WISDOM TOOTH EXTRACTION       Current Outpatient Medications  Medication Sig Dispense Refill   acetaminophen (TYLENOL) 500 MG tablet Take 500-1,000 mg by mouth every 6 (six) hours as needed for mild pain or fever.      alendronate (FOSAMAX) 70 MG tablet Take 70 mg by mouth every Saturday.     allopurinol (ZYLOPRIM) 100 MG tablet Take 100 mg by mouth at bedtime.      aspirin 81 MG tablet Take 81 mg by mouth at bedtime.      atorvastatin (LIPITOR) 40 MG tablet TAKE 1 TABLET BY MOUTH EVERY DAY. MAKE. FOLLOW UP APPOINTMENT WITH DOCTOR Nathon Stefanski FOR REFILLS** 90 tablet 1   baclofen (LIORESAL) 10 MG tablet TAKE 1 TABLET BY MOUTH TWICE DAILY AS NEEDED 60 tablet 1   calcium-vitamin D 250-100 MG-UNIT tablet Take 1 tablet by mouth 2 (two) times daily.     citalopram (CELEXA) 20 MG tablet Take 1 tablet (20 mg total) by mouth daily. 90 tablet 0   denosumab (PROLIA) 60 MG/ML SOSY injection Inject 60 mg into the skin every 6 (six) months.     EPINEPHrine 0.3 mg/0.3 mL IJ SOAJ injection Inject 0.3 mg into the muscle  as needed for anaphylaxis.     FARXIGA 10 MG TABS tablet Take 1 tablet (10 mg total) by mouth every morning. 90 tablet 1   furosemide (LASIX) 40 MG tablet Take 1 tablet (40 mg total) by mouth daily. 90 tablet 1   hydrALAZINE (APRESOLINE) 100 MG tablet Take 1 tablet (100 mg total) by mouth 3 (three) times daily. 90 tablet 3   labetalol (NORMODYNE) 100 MG tablet TAKE 1 TABLET(100 MG) BY MOUTH TWICE DAILY 180 tablet 1   losartan (COZAAR) 100 MG tablet TAKE 1 TABLET(100 MG) BY MOUTH DAILY 90 tablet 0   Multiple Vitamin (MULTIVITAMIN WITH MINERALS) TABS tablet Take 1 tablet by mouth daily.     nitroGLYCERIN (NITROSTAT) 0.4 MG SL tablet Place 0.4 mg under the tongue every 5 (five) minutes as needed for  chest pain.     ondansetron (ZOFRAN ODT) 4 MG disintegrating tablet Take 1 tablet (4 mg total) by mouth every 8 (eight) hours as needed for nausea or vomiting. 15 tablet 0   pantoprazole (PROTONIX) 40 MG tablet TAKE 1 TABLET(40 MG) BY MOUTH DAILY 90 tablet 1   Current Facility-Administered Medications  Medication Dose Route Frequency Provider Last Rate Last Admin   [START ON 09/25/2023] denosumab (PROLIA) injection 60 mg  60 mg Subcutaneous Once Th[place], [place], MD        Allergies:   Fire ant, Gabapentin, Albuterol, and Codeine    Social History:   reports that she has never smoked. She has been exposed to tobacco smoke. She has never used smokeless tobacco. She reports that she does not drink alcohol and does not use drugs.   Family History:  family history includes Heart disease in her mother; Heart failure in her father.    ROS:     Review of Systems  Constitutional: Negative.   HENT: Negative.    Eyes: Negative.   Respiratory: Negative.    Gastrointestinal: Negative.   Genitourinary: Negative.   Musculoskeletal: Negative.   Skin: Negative.   Neurological: Negative.   Endo/Heme/Allergies: Negative.   Psychiatric/Behavioral: Negative.    All other systems reviewed and are negative.     All other systems are reviewed and negative.    PHYSICAL EXAM: VS:  BP (!) 142/82   Pulse 65   Ht 5\' 5"  (1.651 m)   Wt 166 lb (75.3 kg)   SpO2 99%   BMI 27.62 kg/m  , BMI Body mass index is 27.62 kg/m. Last weight:  Wt Readings from Last 3 Encounters:  09/20/23 166 lb (75.3 kg)  09/17/23 167 lb (75.8 kg)  09/05/23 166 lb 3.2 oz (75.4 kg)     Physical Exam Constitutional:      Appearance: Normal appearance.  Cardiovascular:     Rate and Rhythm: Normal rate and regular rhythm.     Heart sounds: Normal heart sounds.  Pulmonary:     Effort: Pulmonary effort is normal.     Breath sounds: Normal breath sounds.  Musculoskeletal:     Right lower leg: No edema.     Left lower  leg: No edema.  Neurological:     Mental Status: She is alert.       EKG:   Recent Labs: 08/19/2023: Hemoglobin 11.7; Magnesium 2.0; Platelets 173 08/23/2023: ALT 14; BUN 20; Creatinine, Ser 2.10; Potassium 4.6; Sodium 140    Lipid Panel    Component Value Date/Time   CHOL 156 06/25/2023 1154   TRIG 152 (H) 06/25/2023 1154   HDL 37 (L) 06/25/2023  1154   LDLCALC 92 06/25/2023 1154      Other studies Reviewed: Additional studies/ records that were reviewed today include:  Review of the above records demonstrates:       No data to display            ASSESSMENT AND PLAN:    ICD-10-CM   1. Essential hypertension, benign  I10    repeat BP 145/60, still has orthostasis thus cannot go up on meds as diastolic low    2. Mixed hyperlipidemia  E78.2     3. Stage 3b chronic kidney disease (HCC)  N18.32     4. Nonrheumatic mitral valve regurgitation  I34.0     5. Dizziness  R42    Its better related to orthostasis       Problem List Items Addressed This Visit       Cardiovascular and Mediastinum   Essential hypertension, benign - Primary   Nonrheumatic mitral valve regurgitation     Genitourinary   Stage 3b chronic kidney disease (HCC)     Other   Hyperlipidemia   Dizziness       Disposition:   Return in about 3 months (around 12/21/2023).    Total time spent: 30 minutes  Signed,  Adrian Blackwater, MD  09/20/2023 9:24 AM    [facility]

## 2023-09-21 NOTE — Telephone Encounter (Signed)
 Prior Auth REQUIRED for PROLIA PA PROCESS DETAILS: Please complete the prior authorization form located at [email or web address] or call [phone].

## 2023-09-26 ENCOUNTER — Ambulatory Visit: Payer: Medicare Other | Admitting: Internal Medicine

## 2023-10-01 ENCOUNTER — Other Ambulatory Visit: Payer: Self-pay | Admitting: Internal Medicine

## 2023-10-04 ENCOUNTER — Telehealth: Payer: Self-pay

## 2023-10-04 ENCOUNTER — Other Ambulatory Visit: Payer: Self-pay

## 2023-10-04 DIAGNOSIS — M810 Age-related osteoporosis without current pathological fracture: Secondary | ICD-10-CM

## 2023-10-04 MED ORDER — DENOSUMAB 60 MG/ML ~~LOC~~ SOSY
60.0000 mg | PREFILLED_SYRINGE | Freq: Once | SUBCUTANEOUS | Status: AC
Start: 2024-03-24 — End: 2023-11-12
  Administered 2023-11-12: 60 mg via SUBCUTANEOUS

## 2023-10-04 NOTE — Telephone Encounter (Signed)
Patient had a question about where to continue getting her Prolia. Discussed with MD. MD wants patient to continue here, however permitted to make office visits as the same time as Prolia injections. VM left clarifying for patient

## 2023-10-05 NOTE — Telephone Encounter (Signed)
Prior Auth for [employer] APPROVED via [employer] PA# [id number] Valid: 10/05/23-10/04/24

## 2023-10-07 NOTE — Telephone Encounter (Signed)
Pt ready for scheduling on or after 10/04/24  Out-of-pocket cost due at time of visit: $341  Primary: [employer] A[employer] Medicare Adv HMO-POS Prolia co-insurance: 20% (approximately $320.99) Admin fee co-insurance: $20  Deductible: does not apply  Prior Auth APPROVED PA# [id number] Valid: 10/05/23-10/04/24  Secondary: N/A Prolia co-insurance:  Admin fee co-insurance:  Deductible:  Prior Auth:  PA# Valid:   ** This summary of benefits is an estimation of the patient's out-of-pocket cost. Exact cost may vary based on individual plan coverage.

## 2023-10-15 ENCOUNTER — Ambulatory Visit (INDEPENDENT_AMBULATORY_CARE_PROVIDER_SITE_OTHER): Payer: Medicare Other | Admitting: Cardiology

## 2023-10-15 ENCOUNTER — Encounter: Payer: Self-pay | Admitting: Cardiology

## 2023-10-15 VITALS — BP 118/70 | HR 80 | Ht 65.0 in | Wt 169.8 lb

## 2023-10-15 DIAGNOSIS — Z0000 Encounter for general adult medical examination without abnormal findings: Secondary | ICD-10-CM | POA: Insufficient documentation

## 2023-10-15 NOTE — Progress Notes (Signed)
Established Patient Office Visit  Subjective:  Patient ID: Elaine Norris, female    DOB: 04-10-1946  Age: 77 y.o. MRN: [id number]  Chief Complaint  Patient presents with   Annual Exam    AWV    Patient in office for annual medicare wellness exam. No complaints today.     No other concerns at this time.   Past Medical History:  Diagnosis Date   Anxiety    Related to surgery   Arthritis    hands   Cancer (HCC) 1970's   cervical   Depression    Controlled   Esophagitis    GERD (gastroesophageal reflux disease)    Gout    Hyperlipidemia    Hypertension    Kidney carcinoma (HCC) 06/2020   Myocardial infarction (HCC)    Osteoporosis    feet   Pseudoseizure    Sleep apnea    CPAP    Past Surgical History:  Procedure Laterality Date   CARDIAC CATHETERIZATION  05/2011   [facility]   CARDIAC [facility]ATION  09/2011   [facility]   CARDIAC CAT[facility]TERIZATION  2012   [facility]   CATARACT EXTRACTION W/PHACO Left 02/17/2018   Procedure: CATARACT EXTRACTION PHACO AND INTRAOCULAR LENS PLACEMENT (IOC) LEFT;  Surgeon: Lockie Mola, MD;  Location: [facility];  Service: Ophthalmology;  Laterality: Left;   CATARACT EXTRACTION W/PHACO Right 03/10/2018   Procedure: CATARACT EXTRACTION PHACO AND INTRAOCULAR LENS PLACEMENT (IOC) RIGHT  COMPLICATED;  Surgeon: Lockie Mola, MD;  Location: [facility];  Service: Ophthalmology;  Laterality: Right;  NEEDS EARLY AM 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 malyuhin   CHOLECYSTECTOMY N/A 06/26/2018   Procedure: LAPAROSCOPIC CHOLECYSTECTOMY;  Surgeon: Sheliah Hatch De Blanch, MD;  Location: [facility];  Service: General;  Laterality: N/A;   COLONOSCOPY     COLONOSCOPY WITH PROPOFOL N/A 01/21/2020   Procedure: COLONOSCOPY WITH PROPOFOL;  Surgeon: Toledo, Boykin Nearing, MD;  Location: [facility];  Service: Gastroenterology;  Laterality: N/A;   ESOPHAGOGASTRODUODENOSCOPY (EGD) WITH PROPOFOL N/A 01/21/2020   Procedure:  ESOPHAGOGASTRODUODENOSCOPY (EGD) WITH PROPOFOL;  Surgeon: Toledo, Boykin Nearing, MD;  Location: [facility];  Service: Gastroenterology;  Laterality: N/A;   MULTIPLE TOOTH EXTRACTIONS     TONSILLECTOMY     WISDOM TOOTH EXTRACTION      Social History   Socioeconomic History   Marital status: Widowed    Spouse name: Not on file   Number of children: Not on file   Years of education: Not on file   Highest education level: Not on file  Occupational History   Occupation: retied  Tobacco Use   Smoking status: Never    Passive exposure: Yes   Smokeless tobacco: Never  Vaping Use   Vaping status: Never Used  Substance and Sexual Activity   Alcohol use: No   Drug use: No   Sexual activity: Not Currently  Other Topics Concern   Not on file  Social History Narrative   Not on file   Social Determinants of Health   Financial Resource Strain: Low Risk  (06/19/2018)   Overall Financial Resource Strain (CARDIA)    Difficulty of Paying Living Expenses: Not hard at all  Food Insecurity: No Food Insecurity (07/20/2022)   Hunger Vital Sign    Worried About Running Out of Food in the Last Year: Never true    Ran Out of Food in the Last Year: Never true  Transportation Needs: No Transportation Needs (07/20/2022)   PRAPARE - Administrator, Civil Service (Medical):  No    Lack of Transportation (Non-Medical): No  Physical Activity: Unknown (06/19/2018)   Exercise Vital Sign    Days of Exercise per Week: 2 days    Minutes of Exercise per Session: Not on file  Stress: No Stress Concern Present (06/19/2018)   [employer] of Occupational Health - Occupational Stress Questionnaire    Feeling of Stress : Only a little  Social Connections: Moderately Isolated (06/19/2018)   Social Connection and Isolation Panel [NHANES]    Frequency of Communication with Friends and Family: Not on file    Frequency of Social Gatherings with Friends and Family: More than three times a week    Attends  Religious Services: Never    Database administrator or Organizations: No    Attends Banker Meetings: Never    Marital Status: Divorced  Catering manager Violence: Not At Risk (06/19/2018)   Humiliation, Afraid, Rape, and Kick questionnaire    Fear of Current or Ex-Partner: No    Emotionally Abused: No    Physically Abused: No    Sexually Abused: No    Family History  Problem Relation Age of Onset   Heart disease Mother    Heart failure Father    Breast cancer Neg Hx     Allergies  Allergen Reactions   Fire Ant Anaphylaxis   Gabapentin     Muscles jerks, weakness and difficulty swallowing - Noted in hospital stay   Albuterol Other (See Comments)    Pt reports seizures    Codeine Other (See Comments)    Altered mental status     Outpatient Medications Prior to Visit  Medication Sig   acetaminophen (TYLENOL) 500 MG tablet Take 500-1,000 mg by mouth every 6 (six) hours as needed for mild pain or fever.    alendronate (FOSAMAX) 70 MG tablet Take 70 mg by mouth every Saturday.   allopurinol (ZYLOPRIM) 100 MG tablet Take 100 mg by mouth at bedtime.    aspirin 81 MG tablet Take 81 mg by mouth at bedtime.    atorvastatin (LIPITOR) 40 MG tablet TAKE 1 TABLET BY MOUTH EVERY DAY. MAKE. FOLLOW UP APPOINTMENT WITH DOCTOR KHAN FOR REFILLS**   baclofen (LIORESAL) 10 MG tablet TAKE 1 TABLET BY MOUTH TWICE DAILY AS NEEDED   calcium-vitamin D 250-100 MG-UNIT tablet Take 1 tablet by mouth 2 (two) times daily.   citalopram (CELEXA) 20 MG tablet Take 1 tablet (20 mg total) by mouth daily.   denosumab (PROLIA) 60 MG/ML SOSY injection Inject 60 mg into the skin every 6 (six) months.   EPINEPHrine 0.3 mg/0.3 mL IJ SOAJ injection Inject 0.3 mg into the muscle as needed for anaphylaxis.   FARXIGA 10 MG TABS tablet Take 1 tablet (10 mg total) by mouth every morning.   furosemide (LASIX) 40 MG tablet Take 1 tablet (40 mg total) by mouth daily.   hydrALAZINE (APRESOLINE) 100 MG tablet  Take 1 tablet (100 mg total) by mouth 3 (three) times daily.   labetalol (NORMODYNE) 100 MG tablet TAKE 1 TABLET BY MOUTH TWICE DAILY   losartan (COZAAR) 100 MG tablet TAKE 1 TABLET(100 MG) BY MOUTH DAILY   Multiple Vitamin (MULTIVITAMIN WITH MINERALS) TABS tablet Take 1 tablet by mouth daily.   nitroGLYCERIN (NITROSTAT) 0.4 MG SL tablet Place 0.4 mg under the tongue every 5 (five) minutes as needed for chest pain.   ondansetron (ZOFRAN ODT) 4 MG disintegrating tablet Take 1 tablet (4 mg total) by mouth every 8 (eight) hours  as needed for nausea or vomiting.   pantoprazole (PROTONIX) 40 MG tablet TAKE 1 TABLET(40 MG) BY MOUTH DAILY   Facility-Administered Medications Prior to Visit  Medication Dose Route Frequency Provider   denosumab (PROLIA) injection 60 mg  60 mg Subcutaneous Once Thapa, [place], MD   [START ON 03/24/2024] denosumab (PROLIA) injection 60 mg  60 mg Subcutaneous Once Thapa, [place], MD    Review of Systems  Constitutional: Negative.   HENT: Negative.    Eyes: Negative.   Respiratory: Negative.  Negative for shortness of breath.   Cardiovascular: Negative.  Negative for chest pain.  Gastrointestinal: Negative.  Negative for abdominal pain, constipation and diarrhea.  Genitourinary: Negative.   Musculoskeletal:  Negative for joint pain and myalgias.  Skin: Negative.   Neurological: Negative.  Negative for dizziness and headaches.  Endo/Heme/Allergies: Negative.   All other systems reviewed and are negative.      Objective:   BP 118/70 (BP Location: Left Arm, Patient Position: Sitting, Cuff Size: Large)   Pulse 80   Ht 5\' 5"  (1.651 m)   Wt 169 lb 12.8 oz (77 kg)   SpO2 94%   BMI 28.26 kg/m   Vitals:   10/15/23 1119 10/15/23 1149  BP: (!) 146/70 118/70  Pulse: 80   Height: 5\' 5"  (1.651 m)   Weight: 169 lb 12.8 oz (77 kg)   SpO2: 94%   BMI (Calculated): 28.26     Physical Exam Vitals and nursing note reviewed.  Constitutional:      Appearance: Normal  appearance. She is normal weight.  HENT:     Head: Normocephalic and atraumatic.     Nose: Nose normal.     Mouth/Throat:     Mouth: Mucous membranes are moist.  Eyes:     Extraocular Movements: Extraocular movements intact.     Conjunctiva/sclera: Conjunctivae normal.     Pupils: Pupils are equal, round, and reactive to light.  Cardiovascular:     Rate and Rhythm: Normal rate and regular rhythm.     Pulses: Normal pulses.     Heart sounds: Normal heart sounds.  Pulmonary:     Effort: Pulmonary effort is normal.     Breath sounds: Normal breath sounds.  Abdominal:     General: Abdomen is flat. Bowel sounds are normal.     Palpations: Abdomen is soft.  Musculoskeletal:        General: Normal range of motion.     Cervical back: Normal range of motion.  Skin:    General: Skin is warm and dry.  Neurological:     General: No focal deficit present.     Mental Status: She is alert and oriented to person, place, and time.  Psychiatric:        Mood and Affect: Mood normal.        Behavior: Behavior normal.        Thought Content: Thought content normal.        Judgment: Judgment normal.      No results found for any visits on 10/15/23.  Recent Results (from the past 2160 hour(s))  Resp panel by RT-PCR (RSV, Flu A&B, Covid) Anterior Nasal Swab     Status: None   Collection Time: 08/19/23  6:57 AM   Specimen: Anterior Nasal Swab  Result Value Ref Range   SARS Coronavirus 2 by RT PCR NEGATIVE NEGATIVE    Comment: (NOTE) SARS-CoV-2 target nucleic acids are NOT DETECTED.  The SARS-CoV-2 RNA is generally detectable in upper  respiratory specimens during the acute phase of infection. The lowest concentration of SARS-CoV-2 viral copies this assay can detect is 138 copies/mL. A negative result does not preclude SARS-Cov-2 infection and should not be used as the sole basis for treatment or other patient management decisions. A negative result may occur with  improper specimen  collection/handling, submission of specimen other than nasopharyngeal swab, presence of viral mutation(s) within the areas targeted by this assay, and inadequate number of viral copies(<138 copies/mL). A negative result must be combined with clinical observations, patient history, and epidemiological information. The expected result is Negative.  Fact Sheet for Patients:  BloggerCourse.com  Fact Sheet for Healthcare Providers:  SeriousBroker.it  This test is no t yet approved or cleared by the [place] FDA and  has been authorized for detection and/or diagnosis of SARS-CoV-2 by FDA under an Emergency Use Authorization (EUA). This EUA will remain  in effect (meaning this test can be used) for the duration of the COVID-19 declaration under Section 564(b)(1) of the Act, 21 U.S.C.section 360bbb-3(b)(1), unless the authorization is terminated  or revoked sooner.       Influenza A by PCR NEGATIVE NEGATIVE   Influenza B by PCR NEGATIVE NEGATIVE    Comment: (NOTE) The Xpert Xpress SARS-CoV-2/FLU/RSV plus assay is intended as an aid in the diagnosis of influenza from Nasopharyngeal swab specimens and should not be used as a sole basis for treatment. Nasal washings and aspirates are unacceptable for Xpert Xpress SARS-CoV-2/FLU/RSV testing.  Fact Sheet for Patients: BloggerCourse.com  Fact Sheet for Healthcare Providers: SeriousBroker.it  This test is not yet approved or cleared by the [place] FDA and has been authorized for detection and/or diagnosis of SARS-CoV-2 by FDA under an Emergency Use Authorization (EUA). This EUA will remain in effect (meaning this test can be used) for the duration of the COVID-19 declaration under Section 564(b)(1) of the Act, 21 U.S.C. section 360bbb-3(b)(1), unless the authorization is terminated or revoked.     Resp Syncytial Virus by PCR  NEGATIVE NEGATIVE    Comment: (NOTE) Fact Sheet for Patients: BloggerCourse.com  Fact Sheet for Healthcare Providers: SeriousBroker.it  This test is not yet approved or cleared by the [place] FDA and has been authorized for detection and/or diagnosis of SARS-CoV-2 by FDA under an Emergency Use Authorization (EUA). This EUA will remain in effect (meaning this test can be used) for the duration of the COVID-19 declaration under Section 564(b)(1) of the Act, 21 U.S.C. section 360bbb-3(b)(1), unless the authorization is terminated or revoked.  Performed at [facility], [address] [place], [place] [address]   CBC with Differential     Status: Abnormal   Collection Time: 08/19/23  8:22 AM  Result Value Ref Range   WBC 8.2 4.0 - 10.5 K/uL   RBC 3.75 (L) 3.87 - 5.11 MIL/uL   Hemoglobin 11.7 (L) 12.0 - 15.0 g/dL   HCT 47.8 (L) 29.5 - 62.1 %   MCV 91.2 80.0 - 100.0 fL   MCH 31.2 26.0 - 34.0 pg   MCHC 34.2 30.0 - 36.0 g/dL   RDW 30.8 65.7 - 84.6 %   Platelets 173 150 - 400 K/uL   nRBC 0.0 0.0 - 0.2 %   Neutrophils Relative % 62 %   Neutro Abs 5.1 1.7 - 7.7 K/uL   Lymphocytes Relative 27 %   Lymphs Abs 2.2 0.7 - 4.0 K/uL   Monocytes Relative 8 %   Monocytes Absolute 0.7 0.1 - 1.0 K/uL  Eosinophils Relative 2 %   Eosinophils Absolute 0.1 0.0 - 0.5 K/uL   Basophils Relative 1 %   Basophils Absolute 0.0 0.0 - 0.1 K/uL   Immature Granulocytes 0 %   Abs Immature Granulocytes 0.01 0.00 - 0.07 K/uL    Comment: Performed at [facility], [address], [place], [place] [address]  Basic metabolic panel     Status: Abnormal   Collection Time: 08/19/23  8:22 AM  Result Value Ref Range   Sodium 138 135 - 145 mmol/L   Potassium 2.6 (LL) 3.5 - 5.1 mmol/L    Comment: CRITICAL RESULT CALLED TO, READ BACK BY AND VERIFIED WITH DEE MCCLAIN  08/19/23 @ 0904 BY SH    Chloride 105 98 - 111 mmol/L   CO2 20 (L) 22 -  32 mmol/L   Glucose, Bld 92 70 - 99 mg/dL    Comment: Glucose reference range applies only to samples taken after fasting for at least 8 hours.   BUN 29 (H) 8 - 23 mg/dL   Creatinine, Ser 6.04 (H) 0.44 - 1.00 mg/dL   Calcium 9.3 8.9 - 54.0 mg/dL   GFR, Estimated 27 (L) >60 mL/min    Comment: (NOTE) Calculated using the CKD-EPI Creatinine Equation (2021)    Anion gap 13 5 - 15    Comment: Performed at [facility], [address] [place], [place] [address]  Troponin I (High Sensitivity)     Status: Abnormal   Collection Time: 08/19/23  8:22 AM  Result Value Ref Range   Troponin I (High Sensitivity) 19 (H) <18 ng/L    Comment: (NOTE) Elevated high sensitivity troponin I (hsTnI) values and significant  changes across serial measurements may suggest ACS but many other  chronic and acute conditions are known to elevate hsTnI results.  Refer to the "Links" section for chest pain algorithms and additional  guidance. Performed at [facility], [address] [place], [place] [address]   Magnesium     Status: None   Collection Time: 08/19/23  8:22 AM  Result Value Ref Range   Magnesium 2.0 1.7 - 2.4 mg/dL    Comment: Performed at [facility], [address] [place], [place] [address]  Troponin I (High Sensitivity)     Status: Abnormal   Collection Time: 08/19/23  9:27 AM  Result Value Ref Range   Troponin I (High Sensitivity) 19 (H) <18 ng/L    Comment: (NOTE) Elevated high sensitivity troponin I (hsTnI) values and significant  changes across serial measurements may suggest ACS but many other  chronic and acute conditions are known to elevate hsTnI results.  Refer to the "Links" section for chest pain algorithms and additional  guidance. Performed at [facility], [address] [place], [place] [address]   [id number]     Status: Abnormal   Collection Time: 08/23/23  2:34 PM  Result Value Ref Range   Glucose 82 70 - 99 mg/dL   BUN 20  8 - 27 mg/dL   Creatinine, Ser 2.84 (H) 0.57 - 1.00 mg/dL   eGFR 24 (L) >13 KG/MWN/0.27   BUN/Creatinine Ratio 10 (L) 12 - 28   Sodium 140 134 - 144 mmol/L   Potassium 4.6 3.5 - 5.2 mmol/L   Chloride 102 96 - 106 mmol/L   CO2 23 20 - 29 mmol/L   Calcium 9.5 8.7 - 10.3 mg/dL   Total Protein 6.5 6.0 - 8.5 g/dL   Albumin 4.2 3.8 - 4.8  g/dL   Globulin, Total 2.3 1.5 - 4.5 g/dL   Bilirubin Total 0.4 0.0 - 1.2 mg/dL   Alkaline Phosphatase 76 44 - 121 IU/L   AST 17 0 - 40 IU/L   ALT 14 0 - 32 IU/L      Assessment & Plan:   Discussed PHQ9 score. Patient on Celexa, declines increasing dose at this time.    Problem List Items Addressed This Visit       Other   Encounter for annual health examination - Primary    Return in about 3 months (around 01/13/2024).   Total time spent: 25 minutes  [employer], NP  10/15/2023   This document may have been prepared by Dragon Voice Recognition software and as such may include unintentional dictation errors.

## 2023-10-16 ENCOUNTER — Other Ambulatory Visit: Payer: Self-pay | Admitting: Family

## 2023-10-25 NOTE — Progress Notes (Signed)
DEXA scan done at [facility] on October 30, 2022 T-score AP spine L1-L4 -0.9, left femoral neck -1.2, total hip left -2.7.  Overall consistent with osteoporosis.  Report is scanned into media.

## 2023-11-09 ENCOUNTER — Other Ambulatory Visit: Payer: Self-pay | Admitting: Cardiovascular Disease

## 2023-11-10 ENCOUNTER — Other Ambulatory Visit: Payer: Self-pay | Admitting: Cardiovascular Disease

## 2023-11-12 ENCOUNTER — Ambulatory Visit: Payer: Medicare Other

## 2023-11-12 DIAGNOSIS — M810 Age-related osteoporosis without current pathological fracture: Secondary | ICD-10-CM

## 2023-11-12 MED ORDER — DENOSUMAB 60 MG/ML ~~LOC~~ SOSY
60.0000 mg | PREFILLED_SYRINGE | Freq: Once | SUBCUTANEOUS | Status: AC
Start: 2024-05-11 — End: ?

## 2023-11-12 NOTE — Progress Notes (Signed)
 After obtaining consent, and per orders of Dr. Erroll Luna, injection of Prolia 60 mg given by Pollie Meyer. Patient instructed to remain in clinic for 20 minutes afterwards, and to report any adverse reaction to me immediately.

## 2023-11-13 NOTE — Telephone Encounter (Signed)
 Last Prolia inj 11/12/23 Next Prolia inj due 05/12/24

## 2023-11-26 DIAGNOSIS — N1832 Chronic kidney disease, stage 3b: Secondary | ICD-10-CM | POA: Diagnosis not present

## 2023-11-26 DIAGNOSIS — N2581 Secondary hyperparathyroidism of renal origin: Secondary | ICD-10-CM | POA: Diagnosis not present

## 2023-11-26 DIAGNOSIS — E876 Hypokalemia: Secondary | ICD-10-CM | POA: Diagnosis not present

## 2023-11-26 DIAGNOSIS — D631 Anemia in chronic kidney disease: Secondary | ICD-10-CM | POA: Diagnosis not present

## 2023-11-26 DIAGNOSIS — I129 Hypertensive chronic kidney disease with stage 1 through stage 4 chronic kidney disease, or unspecified chronic kidney disease: Secondary | ICD-10-CM | POA: Diagnosis not present

## 2023-12-01 ENCOUNTER — Other Ambulatory Visit: Payer: Self-pay | Admitting: Internal Medicine

## 2023-12-23 ENCOUNTER — Encounter: Payer: Self-pay | Admitting: Cardiovascular Disease

## 2023-12-23 ENCOUNTER — Ambulatory Visit: Payer: Medicare Other | Admitting: Cardiovascular Disease

## 2023-12-23 VITALS — BP 148/76 | HR 60 | Ht 65.0 in | Wt 162.0 lb

## 2023-12-23 DIAGNOSIS — G4733 Obstructive sleep apnea (adult) (pediatric): Secondary | ICD-10-CM

## 2023-12-23 DIAGNOSIS — I340 Nonrheumatic mitral (valve) insufficiency: Secondary | ICD-10-CM

## 2023-12-23 DIAGNOSIS — I10 Essential (primary) hypertension: Secondary | ICD-10-CM | POA: Diagnosis not present

## 2023-12-23 DIAGNOSIS — R0789 Other chest pain: Secondary | ICD-10-CM | POA: Diagnosis not present

## 2023-12-23 NOTE — Progress Notes (Signed)
 Cardiology Office Note   Date:  12/23/2023   ID:  Elaine, Norris Oct 07, 1946, MRN [id number]  PCP:  Aisha Hove, MD  Cardiologist:  Debborah Fairly, MD      History of Present Illness: Elaine Norris is a 78 y.o. female who presents for  Chief Complaint  Patient presents with   Follow-up    3 month follow up    Chest Pain  This is a new problem. The onset quality is gradual. The problem occurs 2 to 4 times per day. The problem has been waxing and waning. The pain is at a severity of 4/10. The quality of the pain is described as sharp.      Past Medical History:  Diagnosis Date   Anxiety    Related to surgery   Arthritis    hands   Cancer (HCC) 1970's   cervical   Depression    Controlled   Esophagitis    GERD (gastroesophageal reflux disease)    Gout    Hyperlipidemia    Hypertension    Kidney carcinoma (HCC) 06/2020   Myocardial infarction (HCC)    Osteoporosis    feet   Pseudoseizure    Sleep apnea    CPAP     Past Surgical History:  Procedure Laterality Date   CARDIAC CATHETERIZATION  05/2011   [facility]   CARD[facility]  09/2011   [facility]   CARD[facility]AC [facility]IZATION  2012   [facility]   CATARACT EXTRACTION W/PHACO Left 02/17/2018   Procedure: CATARACT EXTRACTION PHACO AND INTRAOCULAR LENS PLACEMENT (IOC) LEFT;  Surgeon: Annell Kidney, MD;  Location: [facility];  Service: Ophthalmology;  Laterality: Left;   CATARACT EXTRACTION W/PHACO Right 03/10/2018   Procedure: CATARACT EXTRACTION PHACO AND INTRAOCULAR LENS PLACEMENT (IOC) RIGHT  COMPLICATED;  Surgeon: Annell Kidney, MD;  Location: [facility];  Service: Ophthalmology;  Laterality: Right;  NEEDS EARLY AM 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 malyuhin   CHOLECYSTECTOMY N/A 06/26/2018   Procedure: LAPAROSCOPIC CHOLECYSTECTOMY;  Surgeon: Dorrie Gaudier Alphonso Aschoff, MD;  Location: [facility];  Service: General;  Laterality: N/A;   COLONOSCOPY     COLONOSCOPY WITH PROPOFOL  N/A  01/21/2020   Procedure: COLONOSCOPY WITH PROPOFOL ;  Surgeon: Toledo, Alphonsus Jeans, MD;  Location: [facility];  Service: Gastroenterology;  Laterality: N/A;   ESOPHAGOGASTRODUODENOSCOPY (EGD) WITH PROPOFOL  N/A 01/21/2020   Procedure: ESOPHAGOGASTRODUODENOSCOPY (EGD) WITH PROPOFOL ;  Surgeon: Toledo, Alphonsus Jeans, MD;  Location: [facility];  Service: Gastroenterology;  Laterality: N/A;   MULTIPLE TOOTH EXTRACTIONS     TONSILLECTOMY     WISDOM TOOTH EXTRACTION       Current Outpatient Medications  Medication Sig Dispense Refill   labetalol  (NORMODYNE ) 100 MG tablet TAKE 1 TABLET BY MOUTH TWICE DAILY 180 tablet 1   acetaminophen  (TYLENOL ) 500 MG tablet Take 500-1,000 mg by mouth every 6 (six) hours as needed for mild pain or fever.      alendronate (FOSAMAX) 70 MG tablet Take 70 mg by mouth every Saturday.     allopurinol  (ZYLOPRIM ) 100 MG tablet TAKE 1 TABLET BY MOUTH EVERY DAY 90 tablet 3   aspirin  81 MG tablet Take 81 mg by mouth at bedtime.      atorvastatin  (LIPITOR) 40 MG tablet TAKE 1 TABLET BY MOUTH EVERY DAY. MAKE. FOLLOW UP APPOINTMENT WITH DOCTOR Adynn Caseres FOR REFILLS** 90 tablet 1   baclofen  (LIORESAL ) 10 MG tablet TAKE 1 TABLET BY MOUTH TWICE DAILY AS NEEDED 60 tablet 1   calcium -vitamin D   250-100 MG-UNIT tablet Take 1 tablet by mouth 2 (two) times daily.     citalopram  (CELEXA ) 20 MG tablet Take 1 tablet (20 mg total) by mouth daily. 90 tablet 0   denosumab  (PROLIA ) 60 MG/ML SOSY injection Inject 60 mg into the skin every 6 (six) months.     EPINEPHrine  0.3 mg/0.3 mL IJ SOAJ injection Inject 0.3 mg into the muscle as needed for anaphylaxis.     FARXIGA  10 MG TABS tablet Take 1 tablet (10 mg total) by mouth every morning. 90 tablet 1   furosemide  (LASIX ) 40 MG tablet Take 1 tablet (40 mg total) by mouth daily. 90 tablet 1   hydrALAZINE  (APRESOLINE ) 100 MG tablet Take 1 tablet (100 mg total) by mouth 3 (three) times daily. 90 tablet 3   hydrALAZINE  (APRESOLINE ) 50 MG tablet TAKE 1 TABLET  BY MOUTH TWICE DAILY. MAKE. FOLLOW UP APPOINTMENT WITH DOCTOR Patrica Mendell FOR REFILLS** (Patient not taking: Reported on 12/23/2023) 180 tablet 0   losartan  (COZAAR ) 100 MG tablet TAKE 1 TABLET(100 MG) BY MOUTH DAILY 90 tablet 0   Multiple Vitamin (MULTIVITAMIN WITH MINERALS) TABS tablet Take 1 tablet by mouth daily.     nitroGLYCERIN  (NITROSTAT ) 0.4 MG SL tablet Place 0.4 mg under the tongue every 5 (five) minutes as needed for chest pain.     ondansetron  (ZOFRAN  ODT) 4 MG disintegrating tablet Take 1 tablet (4 mg total) by mouth every 8 (eight) hours as needed for nausea or vomiting. 15 tablet 0   pantoprazole  (PROTONIX ) 40 MG tablet TAKE 1 TABLET BY MOUTH EVERY DAY 90 tablet 1   Current Facility-Administered Medications  Medication Dose Route Frequency Provider Last Rate Last Admin   denosumab  (PROLIA ) injection 60 mg  60 mg Subcutaneous Once Thapa, [place], MD       Cecily Cohen ON 05/11/2024] denosumab  (PROLIA ) injection 60 mg  60 mg Subcutaneous Once Thapa, [place], MD        Allergies:   Fire ant, Gabapentin, Albuterol, and Codeine    Social History:   reports that she has never smoked. She has been exposed to tobacco smoke. She has never used smokeless tobacco. She reports that she does not drink alcohol and does not use drugs.   Family History:  family history includes Heart disease in her mother; Heart failure in her father.    ROS:     Review of Systems  Constitutional: Negative.   HENT: Negative.    Eyes: Negative.   Respiratory: Negative.    Cardiovascular:  Positive for chest pain.  Gastrointestinal: Negative.   Genitourinary: Negative.   Musculoskeletal: Negative.   Skin: Negative.   Neurological: Negative.   Endo/Heme/Allergies: Negative.   Psychiatric/Behavioral: Negative.    All other systems reviewed and are negative.     All other systems are reviewed and negative.    PHYSICAL EXAM: VS:  BP (!) 148/76   Pulse 60   Ht 5\' 5"  (1.651 m)   Wt 162 lb (73.5 kg)   SpO2 98%    BMI 26.96 kg/m  , BMI Body mass index is 26.96 kg/m. Last weight:  Wt Readings from Last 3 Encounters:  12/23/23 162 lb (73.5 kg)  10/15/23 169 lb 12.8 oz (77 kg)  09/20/23 166 lb (75.3 kg)     Physical Exam Constitutional:      Appearance: Normal appearance.  Cardiovascular:     Rate and Rhythm: Normal rate and regular rhythm.     Heart sounds: Normal heart sounds.  Pulmonary:  Effort: Pulmonary effort is normal.     Breath sounds: Normal breath sounds.  Musculoskeletal:     Right lower leg: No edema.     Left lower leg: No edema.  Neurological:     Mental Status: She is alert.       EKG:   Recent Labs: 08/19/2023: Hemoglobin 11.7; Magnesium 2.0; Platelets 173 08/23/2023: ALT 14; BUN 20; Creatinine, Ser 2.10; Potassium 4.6; Sodium 140    Lipid Panel    Component Value Date/Time   CHOL 156 06/25/2023 1154   TRIG 152 (H) 06/25/2023 1154   HDL 37 (L) 06/25/2023 1154   LDLCALC 92 06/25/2023 1154      Other studies Reviewed: Additional studies/ records that were reviewed today include:  Review of the above records demonstrates:       No data to display            ASSESSMENT AND PLAN:    ICD-10-CM   1. Essential hypertension, benign  I10 MYOCARDIAL PERFUSION IMAGING    2. Nonrheumatic mitral valve regurgitation  I34.0 MYOCARDIAL PERFUSION IMAGING    3. Obstructive sleep apnea syndrome  G47.33 MYOCARDIAL PERFUSION IMAGING    4. Other chest pain  R07.89 MYOCARDIAL PERFUSION IMAGING   had chest pain, advise stress test. last evaluation was 2012, when had minor irregularities on cath       Problem List Items Addressed This Visit       Cardiovascular and Mediastinum   Essential hypertension, benign - Primary   Relevant Orders   MYOCARDIAL PERFUSION IMAGING   Nonrheumatic mitral valve regurgitation   Relevant Orders   MYOCARDIAL PERFUSION IMAGING     Respiratory   Sleep apnea   Relevant Orders   MYOCARDIAL PERFUSION IMAGING     Other    Chest pain   Relevant Orders   MYOCARDIAL PERFUSION IMAGING       Disposition:   Return in about 5 weeks (around 01/27/2024) for stress test and f/u.    Total time spent: 30 minutes  Signed,  Debborah Fairly, MD  12/23/2023 10:38 AM    [facility]

## 2023-12-29 ENCOUNTER — Other Ambulatory Visit: Payer: Self-pay | Admitting: Internal Medicine

## 2024-01-13 ENCOUNTER — Ambulatory Visit: Payer: Medicare Other | Admitting: Cardiology

## 2024-01-16 ENCOUNTER — Encounter: Payer: Self-pay | Admitting: Internal Medicine

## 2024-01-16 ENCOUNTER — Ambulatory Visit (INDEPENDENT_AMBULATORY_CARE_PROVIDER_SITE_OTHER): Payer: Medicare Other | Admitting: Internal Medicine

## 2024-01-16 VITALS — BP 134/72 | HR 66 | Ht 65.0 in | Wt 168.0 lb

## 2024-01-16 DIAGNOSIS — N1832 Chronic kidney disease, stage 3b: Secondary | ICD-10-CM | POA: Diagnosis not present

## 2024-01-16 DIAGNOSIS — I10 Essential (primary) hypertension: Secondary | ICD-10-CM

## 2024-01-16 DIAGNOSIS — E782 Mixed hyperlipidemia: Secondary | ICD-10-CM

## 2024-01-16 DIAGNOSIS — W5501XA Bitten by cat, initial encounter: Secondary | ICD-10-CM | POA: Diagnosis not present

## 2024-01-16 DIAGNOSIS — R0789 Other chest pain: Secondary | ICD-10-CM

## 2024-01-16 DIAGNOSIS — R7309 Other abnormal glucose: Secondary | ICD-10-CM | POA: Diagnosis not present

## 2024-01-16 MED ORDER — EPINEPHRINE 0.3 MG/0.3ML IJ SOAJ: 0.3000 mg | INTRAMUSCULAR | 2 refills | Status: AC | PRN

## 2024-01-16 MED ORDER — AMOXICILLIN-POT CLAVULANATE 875-125 MG PO TABS
1.0000 | ORAL_TABLET | Freq: Two times a day (BID) | ORAL | 0 refills | Status: DC
Start: 2024-01-16 — End: 2024-01-24

## 2024-01-16 MED ORDER — NITROGLYCERIN 0.4 MG SL SUBL: 0.4000 mg | SUBLINGUAL_TABLET | SUBLINGUAL | 6 refills | Status: AC | PRN

## 2024-01-16 MED ORDER — LOSARTAN POTASSIUM 100 MG PO TABS: 100.0000 mg | ORAL_TABLET | Freq: Every day | ORAL | 3 refills | Status: AC

## 2024-01-16 NOTE — Progress Notes (Signed)
 Established Patient Office Visit  Subjective:  Patient ID: Elaine Norris, female    DOB: Jun 12, 1946  Age: 78 y.o. MRN: [id number]  Chief Complaint  Patient presents with   Follow-up    3 month follow up    Patient comes in for her follow-up today.  She generally feels well however mentions that a  cat bit her on her right index finger 2 days ago.  It was a stray cat, that would visit her home frequently, but with unsure vaccination status, and the cat was found dead that the next day.  Her finger initially got swollen and tender but notes that the swelling has gone down definitely and is not painful.  Denies any fevers or chills, no sore throat no bodyaches.  Will start on p.o. Augmentin.  Patient is also due for lab work.    No other concerns at this time.   Past Medical History:  Diagnosis Date   Anxiety    Related to surgery   Arthritis    hands   Cancer (HCC) 1970's   cervical   Depression    Controlled   Esophagitis    GERD (gastroesophageal reflux disease)    Gout    Hyperlipidemia    Hypertension    Kidney carcinoma (HCC) 06/2020   Myocardial infarction (HCC)    Osteoporosis    feet   Pseudoseizure    Sleep apnea    CPAP    Past Surgical History:  Procedure Laterality Date   CARDIAC CATHETERIZATION  05/2011   [facility]   CARD[facility]AC [facility]  [facility]/2011   [facility]   CARDIAC CATHETERIZATION  2012   [facility]   CATARACT EXTRACTION W/PHACO Left 02/17/2018   Procedure: CATARACT EXTRACTION PHACO AND INTRAOCULAR LENS PLACEMENT (IOC) LEFT;  Surgeon: Lockie Mola, MD;  Location: [facility];  Service: Ophthalmology;  Laterality: Left;   CATARACT EXTRACTION W/PHACO Right 03/10/2018   Procedure: CATARACT EXTRACTION PHACO AND INTRAOCULAR LENS PLACEMENT (IOC) RIGHT  COMPLICATED;  Surgeon: Lockie Mola, MD;  Location: [facility];  Service: Ophthalmology;  Laterality: Right;  NEEDS EARLY AM 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 malyuhin   CHOLECYSTECTOMY  N/A 06/26/2018   Procedure: LAPAROSCOPIC CHOLECYSTECTOMY;  Surgeon: Sheliah Hatch De Blanch, MD;  Location: [facility];  Service: General;  Laterality: N/A;   COLONOSCOPY     COLONOSCOPY WITH PROPOFOL N/A 01/21/2020   Procedure: COLONOSCOPY WITH PROPOFOL;  Surgeon: Toledo, Boykin Nearing, MD;  Location: [facility];  Service: Gastroenterology;  Laterality: N/A;   ESOPHAGOGASTRODUODENOSCOPY (EGD) WITH PROPOFOL N/A 01/21/2020   Procedure: ESOPHAGOGASTRODUODENOSCOPY (EGD) WITH PROPOFOL;  Surgeon: Toledo, Boykin Nearing, MD;  Location: [facility];  Service: Gastroenterology;  Laterality: N/A;   MULTIPLE TOOTH EXTRACTIONS     TONSILLECTOMY     WISDOM TOOTH EXTRACTION      Social History   Socioeconomic History   Marital status: Widowed    Spouse name: Not on file   Number of children: Not on file   Years of education: Not on file   Highest education level: Not on file  Occupational History   Occupation: retied  Tobacco Use   Smoking status: Never    Passive exposure: Yes   Smokeless tobacco: Never  Vaping Use   Vaping status: Never Used  Substance and Sexual Activity   Alcohol use: No   Drug use: No   Sexual activity: Not Currently  Other Topics Concern   Not on file  Social History Narrative   Not on file  Social Drivers of Corporate investment banker Strain: Low Risk  (06/19/2018)   Overall Financial Resource Strain (CARDIA)    Difficulty of Paying Living Expenses: Not hard at all  Food Insecurity: No Food Insecurity (07/20/2022)   Hunger Vital Sign    Worried About Running Out of Food in the Last Year: Never true    Ran Out of Food in the Last Year: Never true  Transportation Needs: No Transportation Needs (07/20/2022)   PRAPARE - Administrator, Civil Service (Medical): No    Lack of Transportation (Non-Medical): No  Physical Activity: Unknown (06/19/2018)   Exercise Vital Sign    Days of Exercise per Week: 2 days    Minutes of Exercise per Session: Not on file  Stress:  No Stress Concern Present (06/19/2018)   [employer] of Occupational Health - Occupational Stress Questionnaire    Feeling of Stress : Only a little  Social Connections: Moderately Isolated (06/19/2018)   Social Connection and Isolation Panel [NHANES]    Frequency of Communication with Friends and Family: Not on file    Frequency of Social Gatherings with Friends and Family: More than three times a week    Attends Religious Services: Never    Database administrator or Organizations: No    Attends Banker Meetings: Never    Marital Status: Divorced  Catering manager Violence: Not At Risk (06/19/2018)   Humiliation, Afraid, Rape, and Kick questionnaire    Fear of Current or Ex-Partner: No    Emotionally Abused: No    Physically Abused: No    Sexually Abused: No    Family History  Problem Relation Age of Onset   Heart disease Mother    Heart failure Father    Breast cancer Neg Hx     Allergies  Allergen Reactions   Fire Ant Anaphylaxis   Gabapentin     Muscles jerks, weakness and difficulty swallowing - Noted in hospital stay   Albuterol Other (See Comments)    Pt reports seizures    Codeine Other (See Comments)    Altered mental status     Outpatient Medications Prior to Visit  Medication Sig   allopurinol (ZYLOPRIM) 100 MG tablet TAKE 1 TABLET BY MOUTH EVERY DAY   aspirin 81 MG tablet Take 81 mg by mouth at bedtime.    atorvastatin (LIPITOR) 40 MG tablet TAKE 1 TABLET BY MOUTH EVERY DAY. MAKE. FOLLOW UP APPOINTMENT WITH DOCTOR Bryndle Corredor FOR REFILLS**   baclofen (LIORESAL) 10 MG tablet TAKE 1 TABLET BY MOUTH TWICE DAILY AS NEEDED   citalopram (CELEXA) 20 MG tablet Take 1 tablet (20 mg total) by mouth daily.   FARXIGA 10 MG TABS tablet Take 1 tablet (10 mg total) by mouth every morning.   furosemide (LASIX) 40 MG tablet Take 1 tablet (40 mg total) by mouth daily.   hydrALAZINE (APRESOLINE) 50 MG tablet TAKE 1 TABLET BY MOUTH TWICE DAILY. MAKE. FOLLOW UP  APPOINTMENT WITH DOCTOR Makahla Kiser FOR REFILLS**   labetalol (NORMODYNE) 100 MG tablet TAKE 1 TABLET BY MOUTH TWICE DAILY   Multiple Vitamin (MULTIVITAMIN WITH MINERALS) TABS tablet Take 1 tablet by mouth daily.   [DISCONTINUED] EPINEPHrine 0.3 mg/0.3 mL IJ SOAJ injection Inject 0.3 mg into the muscle as needed for anaphylaxis.   [DISCONTINUED] losartan (COZAAR) 100 MG tablet TAKE 1 TABLET(100 MG) BY MOUTH DAILY   [DISCONTINUED] nitroGLYCERIN (NITROSTAT) 0.4 MG SL tablet Place 0.4 mg under the tongue every 5 (five) minutes as  needed for chest pain.   acetaminophen (TYLENOL) 500 MG tablet Take 500-1,000 mg by mouth every 6 (six) hours as needed for mild pain or fever.    alendronate (FOSAMAX) 70 MG tablet Take 70 mg by mouth every Saturday.   calcium-vitamin D 250-100 MG-UNIT tablet Take 1 tablet by mouth 2 (two) times daily.   denosumab (PROLIA) 60 MG/ML SOSY injection Inject 60 mg into the skin every 6 (six) months.   hydrALAZINE (APRESOLINE) 100 MG tablet Take 1 tablet (100 mg total) by mouth 3 (three) times daily.   pantoprazole (PROTONIX) 40 MG tablet TAKE 1 TABLET BY MOUTH EVERY DAY   [DISCONTINUED] ondansetron (ZOFRAN ODT) 4 MG disintegrating tablet Take 1 tablet (4 mg total) by mouth every 8 (eight) hours as needed for nausea or vomiting.   Facility-Administered Medications Prior to Visit  Medication Dose Route Frequency Provider   denosumab (PROLIA) injection 60 mg  60 mg Subcutaneous Once Thapa, [place], MD   Melene Muller ON 05/11/2024] denosumab (PROLIA) injection 60 mg  60 mg Subcutaneous Once Thapa, [place], MD    Review of Systems  Constitutional: Negative.  Negative for chills, fever, malaise/fatigue and weight Norris.  HENT: Negative.  Negative for sinus pain and sore throat.   Eyes: Negative.   Respiratory: Negative.  Negative for cough, shortness of breath and stridor.   Cardiovascular: Negative.  Negative for chest pain, palpitations and leg swelling.  Gastrointestinal: Negative.  Negative  for abdominal pain, constipation, diarrhea, heartburn, nausea and vomiting.  Genitourinary: Negative.  Negative for dysuria and flank pain.  Musculoskeletal: Negative.  Negative for joint pain and myalgias.  Skin: Negative.   Neurological: Negative.  Negative for dizziness and headaches.  Endo/Heme/Allergies: Negative.   Psychiatric/Behavioral: Negative.  Negative for depression and suicidal ideas. The patient is not nervous/anxious.        Objective:   BP 134/72   Pulse 66   Ht 5\' 5"  (1.651 m)   Wt 168 lb (76.2 kg)   SpO2 96%   BMI 27.96 kg/m   Vitals:   01/16/24 1018  BP: 134/72  Pulse: 66  Height: 5\' 5"  (1.651 m)  Weight: 168 lb (76.2 kg)  SpO2: 96%  BMI (Calculated): 27.96    Physical Exam Vitals and nursing note reviewed.  Constitutional:      Appearance: Normal appearance.  HENT:     Head: Normocephalic and atraumatic.     Nose: Nose normal.     Mouth/Throat:     Mouth: Mucous membranes are moist.     Pharynx: Oropharynx is clear.  Eyes:     Conjunctiva/sclera: Conjunctivae normal.     Pupils: Pupils are equal, round, and reactive to light.  Cardiovascular:     Rate and Rhythm: Normal rate and regular rhythm.     Pulses: Normal pulses.     Heart sounds: Normal heart sounds. No murmur heard. Pulmonary:     Effort: Pulmonary effort is normal.     Breath sounds: Normal breath sounds. No wheezing.  Abdominal:     General: Bowel sounds are normal.     Palpations: Abdomen is soft.     Tenderness: There is no abdominal tenderness. There is no right CVA tenderness or left CVA tenderness.  Musculoskeletal:        General: Normal range of motion.     Cervical back: Normal range of motion.     Right lower leg: No edema.     Left lower leg: No edema.  Skin:  General: Skin is warm and dry.  Neurological:     General: No focal deficit present.     Mental Status: She is alert and oriented to person, place, and time.  Psychiatric:        Mood and Affect: Mood  normal.        Behavior: Behavior normal.      No results found for any visits on 01/16/24.  No results found for this or any previous visit (from the past 2160 hours).    Assessment & Plan:  Check labs today.  Augmentin prescription sent to the pharmacy.  Patient will monitor her right index finger wound. Check labs today. Problem List Items Addressed This Visit     Chest pain   Relevant Medications   nitroGLYCERIN (NITROSTAT) 0.4 MG SL tablet   losartan (COZAAR) 100 MG tablet   Essential hypertension, benign - Primary   Relevant Medications   nitroGLYCERIN (NITROSTAT) 0.4 MG SL tablet   EPINEPHrine 0.3 mg/0.3 mL IJ SOAJ injection   losartan (COZAAR) 100 MG tablet   Other Relevant Orders   CMP14+EGFR   Hyperlipidemia   Relevant Medications   nitroGLYCERIN (NITROSTAT) 0.4 MG SL tablet   EPINEPHrine 0.3 mg/0.3 mL IJ SOAJ injection   losartan (COZAAR) 100 MG tablet   Other Relevant Orders   Lipid Panel w/o Chol/HDL Ratio   Stage 3b chronic kidney disease (HCC)   Relevant Orders   CMP14+EGFR   Other Visit Diagnoses       Cat bite, initial encounter       Relevant Medications   amoxicillin-clavulanate (AUGMENTIN) 875-125 MG tablet   Other Relevant Orders   CBC with Diff     Elevated glucose       Relevant Orders   Hemoglobin A1c       Return in about 3 months (around 04/17/2024).   Total time spent: 30 minutes  Margaretann Loveless, MD  01/16/2024   This document may have been prepared by [facility] Voice Recognition software and as such may include unintentional dictation errors.

## 2024-01-17 LAB — CBC WITH DIFFERENTIAL/PLATELET
Basophils Absolute: 0 10*3/uL (ref 0.0–0.2)
Basos: 1 %
EOS (ABSOLUTE): 0.2 10*3/uL (ref 0.0–0.4)
Eos: 3 %
Hematocrit: 34.7 % (ref 34.0–46.6)
Hemoglobin: 11.4 g/dL (ref 11.1–15.9)
Immature Grans (Abs): 0 10*3/uL (ref 0.0–0.1)
Immature Granulocytes: 0 %
Lymphocytes Absolute: 1.9 10*3/uL (ref 0.7–3.1)
Lymphs: 28 %
MCH: 30.6 pg (ref 26.6–33.0)
MCHC: 32.9 g/dL (ref 31.5–35.7)
MCV: 93 fL (ref 79–97)
Monocytes Absolute: 0.5 10*3/uL (ref 0.1–0.9)
Monocytes: 8 %
Neutrophils Absolute: 4.1 10*3/uL (ref 1.4–7.0)
Neutrophils: 60 %
Platelets: 159 10*3/uL (ref 150–450)
RBC: 3.73 x10E6/uL — ABNORMAL LOW (ref 3.77–5.28)
RDW: 12.8 % (ref 11.7–15.4)
WBC: 6.7 10*3/uL (ref 3.4–10.8)

## 2024-01-17 LAB — CMP14+EGFR
ALT: 19 IU/L (ref 0–32)
AST: 19 IU/L (ref 0–40)
Albumin: 4.2 g/dL (ref 3.8–4.8)
Alkaline Phosphatase: 85 IU/L (ref 44–121)
BUN/Creatinine Ratio: 13 (ref 12–28)
BUN: 20 mg/dL (ref 8–27)
Bilirubin Total: 0.3 mg/dL (ref 0.0–1.2)
CO2: 24 mmol/L (ref 20–29)
Calcium: 9 mg/dL (ref 8.7–10.3)
Chloride: 105 mmol/L (ref 96–106)
Creatinine, Ser: 1.49 mg/dL — ABNORMAL HIGH (ref 0.57–1.00)
Globulin, Total: 2.1 g/dL (ref 1.5–4.5)
Glucose: 92 mg/dL (ref 70–99)
Potassium: 3.9 mmol/L (ref 3.5–5.2)
Sodium: 143 mmol/L (ref 134–144)
Total Protein: 6.3 g/dL (ref 6.0–8.5)
eGFR: 36 mL/min/{1.73_m2} — ABNORMAL LOW (ref >59)

## 2024-01-17 LAB — LIPID PANEL W/O CHOL/HDL RATIO
Cholesterol, Total: 127 mg/dL (ref 100–199)
HDL: 34 mg/dL — ABNORMAL LOW (ref >39)
LDL Chol Calc (NIH): 66 mg/dL (ref 0–99)
Triglycerides: 158 mg/dL — ABNORMAL HIGH (ref 0–149)
VLDL Cholesterol Cal: 27 mg/dL (ref 5–40)

## 2024-01-17 LAB — HEMOGLOBIN A1C
Est. average glucose Bld gHb Est-mCnc: 111 mg/dL
Hgb A1c MFr Bld: 5.5 % (ref 4.8–5.6)

## 2024-01-20 ENCOUNTER — Ambulatory Visit: Payer: Medicare Other | Admitting: Cardiovascular Disease

## 2024-01-20 ENCOUNTER — Ambulatory Visit (INDEPENDENT_AMBULATORY_CARE_PROVIDER_SITE_OTHER): Payer: Medicare Other

## 2024-01-20 DIAGNOSIS — I10 Essential (primary) hypertension: Secondary | ICD-10-CM

## 2024-01-20 DIAGNOSIS — I340 Nonrheumatic mitral (valve) insufficiency: Secondary | ICD-10-CM | POA: Diagnosis not present

## 2024-01-20 DIAGNOSIS — G4733 Obstructive sleep apnea (adult) (pediatric): Secondary | ICD-10-CM

## 2024-01-20 DIAGNOSIS — R0789 Other chest pain: Secondary | ICD-10-CM

## 2024-01-20 MED ORDER — TECHNETIUM TC 99M SESTAMIBI GENERIC - CARDIOLITE
30.7000 | Freq: Once | INTRAVENOUS | Status: AC | PRN
  Administered 2024-01-20: 30.7 via INTRAVENOUS

## 2024-01-20 MED ORDER — TECHNETIUM TC 99M SESTAMIBI GENERIC - CARDIOLITE
10.0400 | Freq: Once | INTRAVENOUS | Status: AC | PRN
  Administered 2024-01-20: 10.04 via INTRAVENOUS

## 2024-01-23 NOTE — Progress Notes (Signed)
 Patient notified

## 2024-01-24 ENCOUNTER — Ambulatory Visit: Payer: Medicare Other | Admitting: Cardiovascular Disease

## 2024-01-24 ENCOUNTER — Encounter: Payer: Self-pay | Admitting: Cardiovascular Disease

## 2024-01-24 VITALS — BP 169/92 | HR 60 | Ht 65.0 in | Wt 166.0 lb

## 2024-01-24 DIAGNOSIS — I10 Essential (primary) hypertension: Secondary | ICD-10-CM

## 2024-01-24 DIAGNOSIS — G4733 Obstructive sleep apnea (adult) (pediatric): Secondary | ICD-10-CM

## 2024-01-24 DIAGNOSIS — E782 Mixed hyperlipidemia: Secondary | ICD-10-CM | POA: Diagnosis not present

## 2024-01-24 DIAGNOSIS — I340 Nonrheumatic mitral (valve) insufficiency: Secondary | ICD-10-CM | POA: Diagnosis not present

## 2024-01-24 DIAGNOSIS — W5501XA Bitten by cat, initial encounter: Secondary | ICD-10-CM

## 2024-01-24 DIAGNOSIS — R0789 Other chest pain: Secondary | ICD-10-CM

## 2024-01-24 MED ORDER — AMOXICILLIN-POT CLAVULANATE 875-125 MG PO TABS: 1.0000 | ORAL_TABLET | Freq: Two times a day (BID) | ORAL | 0 refills | Status: DC

## 2024-01-24 MED ORDER — HYDRALAZINE HCL 100 MG PO TABS: 100.0000 mg | ORAL_TABLET | Freq: Three times a day (TID) | ORAL | 3 refills | Status: DC

## 2024-01-24 NOTE — Progress Notes (Signed)
 Cardiology Office Note   Date:  01/24/2024   ID:  Elaine, Norris July 14, 1946, MRN [id number]  PCP:  Margaretann Loveless, MD  Cardiologist:  Adrian Blackwater, MD      History of Present Illness: Elaine Norris is a 78 y.o. female who presents for  Chief Complaint  Patient presents with   Follow-up    5 week follow up  NST results    Has sore throat      Past Medical History:  Diagnosis Date   Anxiety    Related to surgery   Arthritis    hands   Cancer [facility]) 1970's   cervical   Depression    Controlled   Esophagitis    GERD (gastroesophageal reflux disease)    Gout    Hyperlipidemia    Hypertension    Kidney carcinoma (HCC) 06/2020   Myocardial infarction (HCC)    Osteoporosis    feet   Pseudoseizure    Sleep apnea    CPAP     Past Surgical History:  Procedure Laterality Date   CARDIAC CATHETERIZATION  05/2011   [facility]   C[facility]  09/2011   [facility]   [facility]ARD[facility]IZATION  2012   [facility]   CATARACT EXTRACTION W/PHACO Left 02/17/2018   Procedure: CATARACT EXTRACTION PHACO AND INTRAOCULAR LENS PLACEMENT (IOC) LEFT;  Surgeon: Lockie Mola, MD;  Location: [facility];  Service: Ophthalmology;  Laterality: Left;   CATARACT EXTRACTION W/PHACO Right 03/10/2018   Procedure: CATARACT EXTRACTION PHACO AND INTRAOCULAR LENS PLACEMENT (IOC) RIGHT  COMPLICATED;  Surgeon: Lockie Mola, MD;  Location: [facility];  Service: Ophthalmology;  Laterality: Right;  NEEDS EARLY AM 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 malyuhin   CHOLECYSTECTOMY N/A 06/26/2018   Procedure: LAPAROSCOPIC CHOLECYSTECTOMY;  Surgeon: Sheliah Hatch De Blanch, MD;  Location: [facility];  Service: General;  Laterality: N/A;   COLONOSCOPY     COLONOSCOPY WITH PROPOFOL N/A 01/21/2020   Procedure: COLONOSCOPY WITH PROPOFOL;  Surgeon: Toledo, Boykin Nearing, MD;  Location: [facility];  Service: Gastroenterology;  Laterality: N/A;   ESOPHAGOGASTRODUODENOSCOPY (EGD) WITH  PROPOFOL N/A 01/21/2020   Procedure: ESOPHAGOGASTRODUODENOSCOPY (EGD) WITH PROPOFOL;  Surgeon: Toledo, Boykin Nearing, MD;  Location: [facility];  Service: Gastroenterology;  Laterality: N/A;   MULTIPLE TOOTH EXTRACTIONS     TONSILLECTOMY     WISDOM TOOTH EXTRACTION       Current Outpatient Medications  Medication Sig Dispense Refill   acetaminophen (TYLENOL) 500 MG tablet Take 500-1,000 mg by mouth every 6 (six) hours as needed for mild pain or fever.      alendronate (FOSAMAX) 70 MG tablet Take 70 mg by mouth every Saturday.     allopurinol (ZYLOPRIM) 100 MG tablet TAKE 1 TABLET BY MOUTH EVERY DAY 90 tablet 3   amoxicillin-clavulanate (AUGMENTIN) 875-125 MG tablet Take 1 tablet by mouth 2 (two) times daily. 14 tablet 0   aspirin 81 MG tablet Take 81 mg by mouth at bedtime.      atorvastatin (LIPITOR) 40 MG tablet TAKE 1 TABLET BY MOUTH EVERY DAY. MAKE. FOLLOW UP APPOINTMENT WITH DOCTOR Israel Werts FOR REFILLS** 90 tablet 1   baclofen (LIORESAL) 10 MG tablet TAKE 1 TABLET BY MOUTH TWICE DAILY AS NEEDED 60 tablet 1   calcium-vitamin D 250-100 MG-UNIT tablet Take 1 tablet by mouth 2 (two) times daily.     citalopram (CELEXA) 20 MG tablet Take 1 tablet (20 mg total) by mouth daily. 90 tablet 0   denosumab (PROLIA) 60  MG/ML SOSY injection Inject 60 mg into the skin every 6 (six) months.     EPINEPHrine 0.3 mg/0.3 mL IJ SOAJ injection Inject 0.3 mg into the muscle as needed for anaphylaxis. 1 each 2   FARXIGA 10 MG TABS tablet Take 1 tablet (10 mg total) by mouth every morning. 90 tablet 1   furosemide (LASIX) 40 MG tablet Take 1 tablet (40 mg total) by mouth daily. 90 tablet 1   hydrALAZINE (APRESOLINE) 100 MG tablet Take 1 tablet (100 mg total) by mouth 3 (three) times daily. 90 tablet 3   labetalol (NORMODYNE) 100 MG tablet TAKE 1 TABLET BY MOUTH TWICE DAILY 180 tablet 1   losartan (COZAAR) 100 MG tablet Take 1 tablet (100 mg total) by mouth daily. 90 tablet 3   Multiple Vitamin (MULTIVITAMIN WITH  MINERALS) TABS tablet Take 1 tablet by mouth daily.     nitroGLYCERIN (NITROSTAT) 0.4 MG SL tablet Place 1 tablet (0.4 mg total) under the tongue every 5 (five) minutes as needed for chest pain. 25 tablet 6   pantoprazole (PROTONIX) 40 MG tablet TAKE 1 TABLET BY MOUTH EVERY DAY 90 tablet 1   Current Facility-Administered Medications  Medication Dose Route Frequency Provider Last Rate Last Admin   denosumab (PROLIA) injection 60 mg  60 mg Subcutaneous Once Thapa, [place], MD       Melene Muller ON 05/11/2024] denosumab (PROLIA) injection 60 mg  60 mg Subcutaneous Once Thapa, [place], MD        Allergies:   Fire ant, Gabapentin, Albuterol, and Codeine    Social History:   reports that she has never smoked. She has been exposed to tobacco smoke. She has never used smokeless tobacco. She reports that she does not drink alcohol and does not use drugs.   Family History:  family history includes Heart disease in her mother; Heart failure in her father.    ROS:     Review of Systems  Constitutional: Negative.   HENT: Negative.    Eyes: Negative.   Respiratory: Negative.    Gastrointestinal: Negative.   Genitourinary: Negative.   Musculoskeletal: Negative.   Skin: Negative.   Neurological: Negative.   Endo/Heme/Allergies: Negative.   Psychiatric/Behavioral: Negative.    All other systems reviewed and are negative.     All other systems are reviewed and negative.    PHYSICAL EXAM: VS:  BP (!) 169/92   Pulse 60   Ht 5\' 5"  (1.651 m)   Wt 166 lb (75.3 kg)   SpO2 99%   BMI 27.62 kg/m  , BMI Body mass index is 27.62 kg/m. Last weight:  Wt Readings from Last 3 Encounters:  01/24/24 166 lb (75.3 kg)  01/16/24 168 lb (76.2 kg)  12/23/23 162 lb (73.5 kg)     Physical Exam Constitutional:      Appearance: Normal appearance.  Cardiovascular:     Rate and Rhythm: Normal rate and regular rhythm.     Heart sounds: Normal heart sounds.  Pulmonary:     Effort: Pulmonary effort is normal.      Breath sounds: Normal breath sounds.  Musculoskeletal:     Right lower leg: No edema.     Left lower leg: No edema.  Neurological:     Mental Status: She is alert.       EKG:   Recent Labs: 08/19/2023: Magnesium 2.0 01/16/2024: ALT 19; BUN 20; Creatinine, Ser 1.49; Hemoglobin 11.4; Platelets 159; Potassium 3.9; Sodium 143    Lipid Panel  Component Value Date/Time   CHOL 127 01/16/2024 1056   TRIG 158 (H) 01/16/2024 1056   HDL 34 (L) 01/16/2024 1056   LDLCALC 66 01/16/2024 1056      Other studies Reviewed: Additional studies/ records that were reviewed today include:  Review of the above records demonstrates:       No data to display            ASSESSMENT AND PLAN:    ICD-10-CM   1. Essential hypertension, benign  I10 hydrALAZINE (APRESOLINE) 100 MG tablet    amoxicillin-clavulanate (AUGMENTIN) 875-125 MG tablet   Bp high, increase hydralazine 100 tid    2. Other chest pain  R07.89 hydrALAZINE (APRESOLINE) 100 MG tablet    amoxicillin-clavulanate (AUGMENTIN) 875-125 MG tablet   Stress test normal    3. Nonrheumatic mitral valve regurgitation  I34.0 hydrALAZINE (APRESOLINE) 100 MG tablet    amoxicillin-clavulanate (AUGMENTIN) 875-125 MG tablet    4. Obstructive sleep apnea syndrome  G47.33 hydrALAZINE (APRESOLINE) 100 MG tablet    amoxicillin-clavulanate (AUGMENTIN) 875-125 MG tablet    5. Mixed hyperlipidemia  E78.2 hydrALAZINE (APRESOLINE) 100 MG tablet    amoxicillin-clavulanate (AUGMENTIN) 875-125 MG tablet    6. Essential hypertension, benign  I10 hydrALAZINE (APRESOLINE) 100 MG tablet    amoxicillin-clavulanate (AUGMENTIN) 875-125 MG tablet    7. Cat bite, initial encounter  W55.01XA amoxicillin-clavulanate (AUGMENTIN) 875-125 MG tablet       Problem List Items Addressed This Visit       Cardiovascular and Mediastinum   Essential hypertension, benign - Primary   Relevant Medications   hydrALAZINE (APRESOLINE) 100 MG tablet    amoxicillin-clavulanate (AUGMENTIN) 875-125 MG tablet   Nonrheumatic mitral valve regurgitation   Relevant Medications   hydrALAZINE (APRESOLINE) 100 MG tablet   amoxicillin-clavulanate (AUGMENTIN) 875-125 MG tablet     Respiratory   Sleep apnea   Relevant Medications   hydrALAZINE (APRESOLINE) 100 MG tablet   amoxicillin-clavulanate (AUGMENTIN) 875-125 MG tablet     Other   Chest pain   Relevant Medications   hydrALAZINE (APRESOLINE) 100 MG tablet   amoxicillin-clavulanate (AUGMENTIN) 875-125 MG tablet   Hyperlipidemia   Relevant Medications   hydrALAZINE (APRESOLINE) 100 MG tablet   amoxicillin-clavulanate (AUGMENTIN) 875-125 MG tablet   Other Visit Diagnoses       Cat bite, initial encounter       Relevant Medications   amoxicillin-clavulanate (AUGMENTIN) 875-125 MG tablet          Disposition:   Return in about 4 weeks (around 02/21/2024).    Total time spent: 30 minutes  Signed,  Adrian Blackwater, MD  01/24/2024 10:11 AM    [facility]

## 2024-02-13 DIAGNOSIS — H04123 Dry eye syndrome of bilateral lacrimal glands: Secondary | ICD-10-CM | POA: Diagnosis not present

## 2024-02-13 DIAGNOSIS — H43813 Vitreous degeneration, bilateral: Secondary | ICD-10-CM | POA: Diagnosis not present

## 2024-02-13 DIAGNOSIS — Z961 Presence of intraocular lens: Secondary | ICD-10-CM | POA: Diagnosis not present

## 2024-02-21 ENCOUNTER — Ambulatory Visit: Admitting: Cardiovascular Disease

## 2024-02-21 ENCOUNTER — Encounter: Payer: Self-pay | Admitting: Cardiovascular Disease

## 2024-02-21 VITALS — BP 150/79 | HR 62 | Ht 65.0 in | Wt 168.0 lb

## 2024-02-21 DIAGNOSIS — N1832 Chronic kidney disease, stage 3b: Secondary | ICD-10-CM

## 2024-02-21 DIAGNOSIS — I340 Nonrheumatic mitral (valve) insufficiency: Secondary | ICD-10-CM

## 2024-02-21 DIAGNOSIS — G4733 Obstructive sleep apnea (adult) (pediatric): Secondary | ICD-10-CM

## 2024-02-21 DIAGNOSIS — I10 Essential (primary) hypertension: Secondary | ICD-10-CM | POA: Diagnosis not present

## 2024-02-21 DIAGNOSIS — E782 Mixed hyperlipidemia: Secondary | ICD-10-CM | POA: Diagnosis not present

## 2024-02-21 NOTE — Progress Notes (Signed)
 Cardiology Office Note   Date:  02/21/2024   ID:  Tinya, Cadogan 24-Feb-1946, MRN [id number]  PCP:  Margaretann Loveless, MD  Cardiologist:  Adrian Blackwater, MD      History of Present Illness: Elaine Norris is a 78 y.o. female who presents for  Chief Complaint  Patient presents with   Follow-up    4 week follow up    Was able to work in yard , feeling better, no chest pain or SOB.      Past Medical History:  Diagnosis Date   Anxiety    Related to surgery   Arthritis    hands   Cancer (HCC) 1970's   cervical   Depression    Controlled   Esophagitis    GERD (gastroesophageal reflux disease)    Gout    Hyperlipidemia    Hypertension    Kidney carcinoma (HCC) 06/2020   Myocardial infarction (HCC)    Osteoporosis    feet   Pseudoseizure    Sleep apnea    CPAP     Past Surgical History:  Procedure Laterality Date   CARDIAC CATHETERIZATION  05/2011   [facility]   [facility]   [facility]   CARD[facility]AC [facility]IZATION  2012   [facility]   CATARACT EXTRACTION W/PHACO Left 02/17/2018   Procedure: CATARACT EXTRACTION PHACO AND INTRAOCULAR LENS PLACEMENT (IOC) LEFT;  Surgeon: Lockie Mola, MD;  Location: [facility];  Service: Ophthalmology;  Laterality: Left;   CATARACT EXTRACTION W/PHACO Right 03/10/2018   Procedure: CATARACT EXTRACTION PHACO AND INTRAOCULAR LENS PLACEMENT (IOC) RIGHT  COMPLICATED;  Surgeon: Lockie Mola, MD;  Location: [facility];  Service: Ophthalmology;  Laterality: Right;  NEEDS EARLY AM 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 [place]in   CHOLECYSTECTOMY N/A 06/26/2018   Procedure: LAPAROSCOPIC CHOLECYSTECTOMY;  Surgeon: Sheliah Hatch De Blanch, MD;  Location: [facility];  Service: General;  Laterality: N/A;   COLONOSCOPY     COLONOSCOPY WITH PROPOFOL N/A 01/21/2020   Procedure: COLONOSCOPY WITH PROPOFOL;  Surgeon: Toledo, Boykin Nearing, MD;  Location: [facility];  Service: Gastroenterology;  Laterality: N/A;    ESOPHAGOGASTRODUODENOSCOPY (EGD) WITH PROPOFOL N/A 01/21/2020   Procedure: ESOPHAGOGASTRODUODENOSCOPY (EGD) WITH PROPOFOL;  Surgeon: Toledo, Boykin Nearing, MD;  Location: [facility];  Service: Gastroenterology;  Laterality: N/A;   MULTIPLE TOOTH EXTRACTIONS     TONSILLECTOMY     WISDOM TOOTH EXTRACTION       Current Outpatient Medications  Medication Sig Dispense Refill   acetaminophen (TYLENOL) 500 MG tablet Take 500-1,000 mg by mouth every 6 (six) hours as needed for mild pain or fever.      alendronate (FOSAMAX) 70 MG tablet Take 70 mg by mouth every Saturday.     allopurinol (ZYLOPRIM) 100 MG tablet TAKE 1 TABLET BY MOUTH EVERY DAY 90 tablet 3   amoxicillin-clavulanate (AUGMENTIN) 875-125 MG tablet Take 1 tablet by mouth 2 (two) times daily. 14 tablet 0   aspirin 81 MG tablet Take 81 mg by mouth at bedtime.      atorvastatin (LIPITOR) 40 MG tablet TAKE 1 TABLET BY MOUTH EVERY DAY. MAKE. FOLLOW UP APPOINTMENT WITH DOCTOR Karolyne Timmons FOR REFILLS** 90 tablet 1   baclofen (LIORESAL) 10 MG tablet TAKE 1 TABLET BY MOUTH TWICE DAILY AS NEEDED 60 tablet 1   calcium-vitamin D 250-100 MG-UNIT tablet Take 1 tablet by mouth 2 (two) times daily.     citalopram (CELEXA) 20 MG tablet Take 1 tablet (20 mg total) by mouth daily.  90 tablet 0   denosumab (PROLIA) 60 MG/ML SOSY injection Inject 60 mg into the skin every 6 (six) months.     EPINEPHrine 0.3 mg/0.3 mL IJ SOAJ injection Inject 0.3 mg into the muscle as needed for anaphylaxis. 1 each 2   FARXIGA 10 MG TABS tablet Take 1 tablet (10 mg total) by mouth every morning. 90 tablet 1   furosemide (LASIX) 40 MG tablet Take 1 tablet (40 mg total) by mouth daily. 90 tablet 1   hydrALAZINE (APRESOLINE) 100 MG tablet Take 1 tablet (100 mg total) by mouth 3 (three) times daily. 90 tablet 3   labetalol (NORMODYNE) 100 MG tablet TAKE 1 TABLET BY MOUTH TWICE DAILY 180 tablet 1   losartan (COZAAR) 100 MG tablet Take 1 tablet (100 mg total) by mouth daily. 90 tablet 3    Multiple Vitamin (MULTIVITAMIN WITH MINERALS) TABS tablet Take 1 tablet by mouth daily.     nitroGLYCERIN (NITROSTAT) 0.4 MG SL tablet Place 1 tablet (0.4 mg total) under the tongue every 5 (five) minutes as needed for chest pain. 25 tablet 6   pantoprazole (PROTONIX) 40 MG tablet TAKE 1 TABLET BY MOUTH EVERY DAY 90 tablet 1   Current Facility-Administered Medications  Medication Dose Route Frequency Provider Last Rate Last Admin   denosumab (PROLIA) injection 60 mg  60 mg Subcutaneous Once Thapa, [place], MD       Melene Muller ON 05/11/2024] denosumab (PROLIA) injection 60 mg  60 mg Subcutaneous Once Thapa, [place], MD        Allergies:   Fire ant, Gabapentin, Albuterol, and Codeine    Social History:   reports that she has never smoked. She has been exposed to tobacco smoke. She has never used smokeless tobacco. She reports that she does not drink alcohol and does not use drugs.   Family History:  family history includes Heart disease in her mother; Heart failure in her father.    ROS:     Review of Systems  Constitutional: Negative.   HENT: Negative.    Eyes: Negative.   Respiratory: Negative.    Gastrointestinal: Negative.   Genitourinary: Negative.   Musculoskeletal: Negative.   Skin: Negative.   Neurological: Negative.   Endo/Heme/Allergies: Negative.   Psychiatric/Behavioral: Negative.    All other systems reviewed and are negative.     All other systems are reviewed and negative.    PHYSICAL EXAM: VS:  BP (!) 150/79   Pulse 62   Ht 5\' 5"  (1.651 m)   Wt 168 lb (76.2 kg)   SpO2 98%   BMI 27.96 kg/m  , BMI Body mass index is 27.96 kg/m. Last weight:  Wt Readings from Last 3 Encounters:  02/21/24 168 lb (76.2 kg)  01/24/24 166 lb (75.3 kg)  01/16/24 168 lb (76.2 kg)     Physical Exam Constitutional:      Appearance: Normal appearance.  Cardiovascular:     Rate and Rhythm: Normal rate and regular rhythm.     Heart sounds: Normal heart sounds.  Pulmonary:      Effort: Pulmonary effort is normal.     Breath sounds: Normal breath sounds.  Musculoskeletal:     Right lower leg: No edema.     Left lower leg: No edema.  Neurological:     Mental Status: She is alert.       EKG:   Recent Labs: 08/19/2023: Magnesium 2.0 01/16/2024: ALT 19; BUN 20; Creatinine, Ser 1.49; Hemoglobin 11.4; Platelets 159; Potassium 3.9; Sodium  143    Lipid Panel    Component Value Date/Time   CHOL 127 01/16/2024 1056   TRIG 158 (H) 01/16/2024 1056   HDL 34 (L) 01/16/2024 1056   LDLCALC 66 01/16/2024 1056      Other studies Reviewed: Additional studies/ records that were reviewed today include:  Review of the above records demonstrates:       No data to display            ASSESSMENT AND PLAN:    ICD-10-CM   1. Nonrheumatic mitral valve regurgitation  I34.0     2. Obstructive sleep apnea syndrome  G47.33     3. Essential hypertension, benign  I10    AT home BPS 130    4. Mixed hyperlipidemia  E78.2     5. Stage 3b chronic kidney disease (HCC)  N18.32        Problem List Items Addressed This Visit       Cardiovascular and Mediastinum   Essential hypertension, benign   Nonrheumatic mitral valve regurgitation - Primary     Respiratory   Sleep apnea     Genitourinary   Stage 3b chronic kidney disease (HCC)     Other   Hyperlipidemia       Disposition:   Return in about 3 months (around 05/22/2024).    Total time spent: 30 minutes  Signed,  Adrian Blackwater, MD  02/21/2024 11:10 AM    [facility]

## 2024-03-05 ENCOUNTER — Ambulatory Visit: Payer: Medicare Other | Admitting: Endocrinology

## 2024-03-05 NOTE — Telephone Encounter (Signed)
 I can run benefits but it will not reflect the most accurate anymore or any policy changes that may take placed between now and July.   Last injection was 11/12/23, the soonest pt will be able to receive next Prolia  injection is 05/12/24.

## 2024-03-05 NOTE — Telephone Encounter (Signed)
 Patient requesting reverification of benefits for confirmation of cost.   She was scheduled for 05.13.25 - this is too soon correct?

## 2024-03-17 DIAGNOSIS — N1832 Chronic kidney disease, stage 3b: Secondary | ICD-10-CM | POA: Diagnosis not present

## 2024-03-17 DIAGNOSIS — D631 Anemia in chronic kidney disease: Secondary | ICD-10-CM | POA: Diagnosis not present

## 2024-03-17 DIAGNOSIS — I129 Hypertensive chronic kidney disease with stage 1 through stage 4 chronic kidney disease, or unspecified chronic kidney disease: Secondary | ICD-10-CM | POA: Diagnosis not present

## 2024-03-17 DIAGNOSIS — E876 Hypokalemia: Secondary | ICD-10-CM | POA: Diagnosis not present

## 2024-03-17 DIAGNOSIS — N2581 Secondary hyperparathyroidism of renal origin: Secondary | ICD-10-CM | POA: Diagnosis not present

## 2024-03-19 NOTE — Telephone Encounter (Signed)
 Prolia  VOB initiated via [email or web address]  Next Prolia  inj DUE: 05/12/24

## 2024-03-24 ENCOUNTER — Ambulatory Visit: Payer: Medicare Other | Admitting: Endocrinology

## 2024-03-30 ENCOUNTER — Other Ambulatory Visit: Payer: Self-pay | Admitting: Internal Medicine

## 2024-03-30 ENCOUNTER — Other Ambulatory Visit

## 2024-03-30 NOTE — Telephone Encounter (Signed)
 [employer] - Prior Authorization REQUIRED for [employer]  PA PROCESS DETAILS: Please ensure your patient meets medical necessity by reviewing Medical Policy  [phone] at [email or web address], and obtaining a pre-determination by contacting the PA dept at [phone].

## 2024-03-31 ENCOUNTER — Ambulatory Visit: Admitting: Cardiology

## 2024-04-04 NOTE — Telephone Encounter (Signed)
 [employer] [facility] [employer]  Patient is ready for scheduling on or after 05/12/24  Out-of-pocket cost due at time of visit: $351.87   Primary: [employer] Advantage HMO-POS Prolia  co-insurance: 20% (approximately $331.87) Admin fee co-insurance: 20% (approximately $25)  Deductible: does not apply  Prior Auth: APPROVED Valid: 04/04/24-04/04/25   Secondary: N/A Prolia  co-insurance:  Admin fee co-insurance:  Deductible:  Prior Auth:  PA# Valid:   ** This summary of benefits is an estimation of the patient's out-of-pocket cost. Exact cost may vary based on individual plan coverage.

## 2024-04-04 NOTE — Telephone Encounter (Signed)
 [employer] [employer]  Prior Authorization for PROLIA  APPROVED PA# [id number] Valid: 04/04/24-04/04/25

## 2024-04-17 ENCOUNTER — Ambulatory Visit: Admitting: Internal Medicine

## 2024-04-20 ENCOUNTER — Telehealth: Payer: Self-pay

## 2024-04-20 ENCOUNTER — Encounter: Payer: Self-pay | Admitting: Internal Medicine

## 2024-04-20 ENCOUNTER — Ambulatory Visit (INDEPENDENT_AMBULATORY_CARE_PROVIDER_SITE_OTHER): Admitting: Internal Medicine

## 2024-04-20 ENCOUNTER — Ambulatory Visit: Payer: Self-pay | Admitting: Internal Medicine

## 2024-04-20 VITALS — BP 154/70 | HR 54 | Ht 65.0 in | Wt 171.8 lb

## 2024-04-20 DIAGNOSIS — E162 Hypoglycemia, unspecified: Secondary | ICD-10-CM

## 2024-04-20 DIAGNOSIS — M810 Age-related osteoporosis without current pathological fracture: Secondary | ICD-10-CM

## 2024-04-20 DIAGNOSIS — E782 Mixed hyperlipidemia: Secondary | ICD-10-CM | POA: Diagnosis not present

## 2024-04-20 DIAGNOSIS — Z1231 Encounter for screening mammogram for malignant neoplasm of breast: Secondary | ICD-10-CM | POA: Diagnosis not present

## 2024-04-20 DIAGNOSIS — K219 Gastro-esophageal reflux disease without esophagitis: Secondary | ICD-10-CM | POA: Diagnosis not present

## 2024-04-20 DIAGNOSIS — I10 Essential (primary) hypertension: Secondary | ICD-10-CM

## 2024-04-20 DIAGNOSIS — N1832 Chronic kidney disease, stage 3b: Secondary | ICD-10-CM | POA: Diagnosis not present

## 2024-04-20 LAB — POCT CBG (FASTING - GLUCOSE)-MANUAL ENTRY: Glucose Fasting, POC: 104 mg/dL — AB (ref 70–99)

## 2024-04-20 MED ORDER — DENOSUMAB 60 MG/ML ~~LOC~~ SOSY
60.0000 mg | PREFILLED_SYRINGE | SUBCUTANEOUS | 0 refills | Status: DC
Start: 2024-04-20 — End: 2024-04-21

## 2024-04-20 NOTE — Progress Notes (Signed)
 Established Patient Office Visit  Subjective:  Patient ID: Elaine Norris, female    DOB: 06-Aug-1946  Age: 78 y.o. MRN: [id number]  Chief Complaint  Patient presents with   Follow-up    3 month follow up    Patient comes in for her follow-up today, accompanied by her daughter.  She is generally feeling well and has no new complaints.  Her blood pressure is slightly elevated but it is also being monitored by her nephrologist.  She is taking all her medications regularly.  She is now on Prolia  injections for osteoporosis, Fosamax has to be stopped.  She would like to get her Prolia  injections here instead of visiting the endocrinologist.   Patient needs to be scheduled for mammogram and will get her labs today.    No other concerns at this time.   Past Medical History:  Diagnosis Date   Anxiety    Related to surgery   Arthritis    hands   Cancer (HCC) 1970's   cervical   Depression    Controlled   Esophagitis    GERD (gastroesophageal reflux disease)    Gout    Hyperlipidemia    Hypertension    Kidney carcinoma (HCC) 06/2020   Myocardial infarction (HCC)    Osteoporosis    feet   Pseudoseizure    Sleep apnea    CPAP    Past Surgical History:  Procedure Laterality Date   CARDIAC CATHETERIZATION  05/2011   [facility]   CARD[facility]  09/2011   [facility]   CARD[facility]IZATION  2012   [facility]   CATARACT EXTRACTION W/PHACO Left 02/17/2018   Procedure: CATARACT EXTRACTION PHACO AND INTRAOCULAR LENS PLACEMENT (IOC) LEFT;  Surgeon: Annell Kidney, MD;  Location: [facility];  Service: Ophthalmology;  Laterality: Left;   CATARACT EXTRACTION W/PHACO Right 03/10/2018   Procedure: CATARACT EXTRACTION PHACO AND INTRAOCULAR LENS PLACEMENT (IOC) RIGHT  COMPLICATED;  Surgeon: Annell Kidney, MD;  Location: [facility];  Service: Ophthalmology;  Laterality: Right;  NEEDS EARLY AM 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 malyuhin   CHOLECYSTECTOMY N/A 06/26/2018    Procedure: LAPAROSCOPIC CHOLECYSTECTOMY;  Surgeon: Dorrie Gaudier Alphonso Aschoff, MD;  Location: [facility];  Service: General;  Laterality: N/A;   COLONOSCOPY     COLONOSCOPY WITH PROPOFOL  N/A 01/21/2020   Procedure: COLONOSCOPY WITH PROPOFOL ;  Surgeon: Toledo, Alphonsus Jeans, MD;  Location: [facility];  Service: Gastroenterology;  Laterality: N/A;   ESOPHAGOGASTRODUODENOSCOPY (EGD) WITH PROPOFOL  N/A 01/21/2020   Procedure: ESOPHAGOGASTRODUODENOSCOPY (EGD) WITH PROPOFOL ;  Surgeon: Toledo, Alphonsus Jeans, MD;  Location: [facility];  Service: Gastroenterology;  Laterality: N/A;   MULTIPLE TOOTH EXTRACTIONS     TONSILLECTOMY     WISDOM TOOTH EXTRACTION      Social History   Socioeconomic History   Marital status: Widowed    Spouse name: Not on file   Number of children: Not on file   Years of education: Not on file   Highest education level: Not on file  Occupational History   Occupation: retied  Tobacco Use   Smoking status: Never    Passive exposure: Yes   Smokeless tobacco: Never  Vaping Use   Vaping status: Never Used  Substance and Sexual Activity   Alcohol use: No   Drug use: No   Sexual activity: Not Currently  Other Topics Concern   Not on file  Social History Narrative   Not on file   Social Drivers of Health   Financial Resource Strain: Low Risk  (  06/19/2018)   Overall Financial Resource Strain (CARDIA)    Difficulty of Paying Living Expenses: Not hard at all  Food Insecurity: No Food Insecurity (07/20/2022)   Hunger Vital Sign    Worried About Running Out of Food in the Last Year: Never true    Ran Out of Food in the Last Year: Never true  Transportation Needs: No Transportation Needs (07/20/2022)   PRAPARE - Administrator, Civil Service (Medical): No    Lack of Transportation (Non-Medical): No  Physical Activity: Unknown (06/19/2018)   Exercise Vital Sign    Days of Exercise per Week: 2 days    Minutes of Exercise per Session: Not on file  Stress: No Stress  Concern Present (06/19/2018)   [employer] of Occupational Health - Occupational Stress Questionnaire    Feeling of Stress : Only a little  Social Connections: Moderately Isolated (06/19/2018)   Social Connection and Isolation Panel [NHANES]    Frequency of Communication with Friends and Family: Not on file    Frequency of Social Gatherings with Friends and Family: More than three times a week    Attends Religious Services: Never    Database administrator or Organizations: No    Attends Banker Meetings: Never    Marital Status: Divorced  Catering manager Violence: Not At Risk (06/19/2018)   Humiliation, Afraid, Rape, and Kick questionnaire    Fear of Current or Ex-Partner: No    Emotionally Abused: No    Physically Abused: No    Sexually Abused: No    Family History  Problem Relation Age of Onset   Heart disease Mother    Heart failure Father    Breast cancer Neg Hx     Allergies  Allergen Reactions   Fire Ant Anaphylaxis   Gabapentin     Muscles jerks, weakness and difficulty swallowing - Noted in hospital stay   Albuterol Other (See Comments)    Pt reports seizures    Codeine Other (See Comments)    Altered mental status     Outpatient Medications Prior to Visit  Medication Sig   acetaminophen  (TYLENOL ) 500 MG tablet Take 500-1,000 mg by mouth every 6 (six) hours as needed for mild pain or fever.    allopurinol  (ZYLOPRIM ) 100 MG tablet TAKE 1 TABLET BY MOUTH EVERY DAY   aspirin  81 MG tablet Take 81 mg by mouth at bedtime.    atorvastatin  (LIPITOR) 40 MG tablet TAKE 1 TABLET BY MOUTH EVERY DAY. MAKE. FOLLOW UP APPOINTMENT WITH DOCTOR Bronwyn Belasco FOR REFILLS**   baclofen  (LIORESAL ) 10 MG tablet TAKE 1 TABLET BY MOUTH TWICE DAILY AS NEEDED   calcitRIOL (ROCALTROL) 0.25 MCG capsule Take 0.25 mcg by mouth daily.   calcium -vitamin D  250-100 MG-UNIT tablet Take 1 tablet by mouth 2 (two) times daily.   citalopram  (CELEXA ) 20 MG tablet Take 1 tablet (20 mg total) by  mouth daily.   EPINEPHrine  0.3 mg/0.3 mL IJ SOAJ injection Inject 0.3 mg into the muscle as needed for anaphylaxis.   FARXIGA  10 MG TABS tablet Take 1 tablet (10 mg total) by mouth every morning.   furosemide  (LASIX ) 40 MG tablet Take 1 tablet (40 mg total) by mouth daily.   hydrALAZINE  (APRESOLINE ) 100 MG tablet Take 1 tablet (100 mg total) by mouth 3 (three) times daily. (Patient taking differently: Take 100 mg by mouth 2 (two) times daily.)   labetalol  (NORMODYNE ) 100 MG tablet TAKE 1 TABLET BY MOUTH TWICE DAILY  losartan  (COZAAR ) 100 MG tablet Take 1 tablet (100 mg total) by mouth daily.   Multiple Vitamin (MULTIVITAMIN WITH MINERALS) TABS tablet Take 1 tablet by mouth daily.   nitroGLYCERIN  (NITROSTAT ) 0.4 MG SL tablet Place 1 tablet (0.4 mg total) under the tongue every 5 (five) minutes as needed for chest pain.   pantoprazole  (PROTONIX ) 40 MG tablet TAKE 1 TABLET BY MOUTH EVERY DAY   spironolactone (ALDACTONE) 25 MG tablet Take 25 mg by mouth daily.   [DISCONTINUED] alendronate (FOSAMAX) 70 MG tablet Take 70 mg by mouth every Saturday.   [DISCONTINUED] denosumab  (PROLIA ) 60 MG/ML SOSY injection Inject 60 mg into the skin every 6 (six) months.   [DISCONTINUED] amoxicillin -clavulanate (AUGMENTIN ) 875-125 MG tablet Take 1 tablet by mouth 2 (two) times daily. (Patient not taking: Reported on 04/20/2024)   Facility-Administered Medications Prior to Visit  Medication Dose Route Frequency Provider   [START ON 05/11/2024] denosumab  (PROLIA ) injection 60 mg  60 mg Subcutaneous Once Thapa, [place], MD   [DISCONTINUED] denosumab  (PROLIA ) injection 60 mg  60 mg Subcutaneous Once Thapa, [place], MD    Review of Systems  Constitutional: Negative.  Negative for chills, fever, malaise/fatigue and weight loss.  HENT: Negative.  Negative for sore throat.   Eyes: Negative.   Respiratory: Negative.  Negative for cough and shortness of breath.   Cardiovascular: Negative.  Negative for chest pain, palpitations  and leg swelling.  Gastrointestinal: Negative.  Negative for abdominal pain, constipation, diarrhea, heartburn, nausea and vomiting.  Genitourinary: Negative.  Negative for dysuria and flank pain.  Musculoskeletal: Negative.  Negative for joint pain and myalgias.  Skin: Negative.   Neurological: Negative.  Negative for dizziness, tingling, tremors, sensory change and headaches.  Endo/Heme/Allergies: Negative.   Psychiatric/Behavioral: Negative.  Negative for depression and suicidal ideas. The patient is not nervous/anxious.        Objective:   BP (!) 154/70   Pulse (!) 54   Ht 5\' 5"  (1.651 m)   Wt 171 lb 12.8 oz (77.9 kg)   SpO2 98%   BMI 28.59 kg/m   Vitals:   04/20/24 1051  BP: (!) 154/70  Pulse: (!) 54  Height: 5\' 5"  (1.651 m)  Weight: 171 lb 12.8 oz (77.9 kg)  SpO2: 98%  BMI (Calculated): 28.59    Physical Exam Vitals and nursing note reviewed.  Constitutional:      Appearance: Normal appearance.  HENT:     Head: Normocephalic and atraumatic.     Nose: Nose normal.     Mouth/Throat:     Mouth: Mucous membranes are moist.     Pharynx: Oropharynx is clear.  Eyes:     Conjunctiva/sclera: Conjunctivae normal.     Pupils: Pupils are equal, round, and reactive to light.  Cardiovascular:     Rate and Rhythm: Normal rate and regular rhythm.     Pulses: Normal pulses.     Heart sounds: Normal heart sounds. No murmur heard. Pulmonary:     Effort: Pulmonary effort is normal.     Breath sounds: Normal breath sounds. No wheezing.  Abdominal:     General: Bowel sounds are normal.     Palpations: Abdomen is soft.     Tenderness: There is no abdominal tenderness. There is no right CVA tenderness or left CVA tenderness.  Musculoskeletal:        General: Normal range of motion.     Cervical back: Normal range of motion.     Right lower leg: No edema.  Left lower leg: No edema.  Skin:    General: Skin is warm and dry.  Neurological:     General: No focal deficit  present.     Mental Status: She is alert and oriented to person, place, and time.  Psychiatric:        Mood and Affect: Mood normal.        Behavior: Behavior normal.      Results for orders placed or performed in visit on 04/20/24  POCT CBG (Fasting - Glucose)  Result Value Ref Range   Glucose Fasting, POC 104 (A) 70 - 99 mg/dL    Recent Results (from the past 2160 hours)  POCT CBG (Fasting - Glucose)     Status: Abnormal   Collection Time: 04/20/24 11:40 AM  Result Value Ref Range   Glucose Fasting, POC 104 (A) 70 - 99 mg/dL      Assessment & Plan:  Continue current medications.  Strict diet control emphasized.  Will order Prolia  injections to be brought over here for administration.  Schedule mammogram. Problem List Items Addressed This Visit     Essential hypertension, benign - Primary   Relevant Medications   spironolactone (ALDACTONE) 25 MG tablet   Other Relevant Orders   CMP14+EGFR   Hyperlipidemia   Relevant Medications   spironolactone (ALDACTONE) 25 MG tablet   Other Relevant Orders   Lipid Panel w/o Chol/HDL Ratio   GERD (gastroesophageal reflux disease)   Relevant Orders   CBC with Diff   Stage 3b chronic kidney disease (HCC)   Relevant Orders   CMP14+EGFR   Other Visit Diagnoses       Age-related osteoporosis without current pathological fracture       Relevant Medications   denosumab  (PROLIA ) 60 MG/ML SOSY injection     Breast cancer screening by mammogram       Relevant Orders   MM 3D SCREENING MAMMOGRAM BILATERAL BREAST     Low blood sugar       Relevant Orders   POCT CBG (Fasting - Glucose) (Completed)       Return in about 3 months (around 07/21/2024).   Total time spent: 30 minutes  Aisha Hove, MD  04/20/2024   This document may have been prepared by [facility] Voice Recognition software and as such may include unintentional dictation errors.

## 2024-04-20 NOTE — Telephone Encounter (Signed)
 Pharmacy called asking for clarification on the Prolia  rx that was sent in for the patient, she said it was sent for and she's not sure that this is correct, is it one syringe or are you sending in more than one with refills,

## 2024-04-21 ENCOUNTER — Other Ambulatory Visit: Payer: Self-pay

## 2024-04-21 LAB — CMP14+EGFR
ALT: 17 IU/L (ref 0–32)
AST: 18 IU/L (ref 0–40)
Albumin: 4.1 g/dL (ref 3.8–4.8)
Alkaline Phosphatase: 79 IU/L (ref 44–121)
BUN/Creatinine Ratio: 15 (ref 12–28)
BUN: 29 mg/dL — ABNORMAL HIGH (ref 8–27)
Bilirubin Total: 0.3 mg/dL (ref 0.0–1.2)
CO2: 20 mmol/L (ref 20–29)
Calcium: 9.5 mg/dL (ref 8.7–10.3)
Chloride: 105 mmol/L (ref 96–106)
Creatinine, Ser: 1.9 mg/dL — ABNORMAL HIGH (ref 0.57–1.00)
Globulin, Total: 2.2 g/dL (ref 1.5–4.5)
Glucose: 90 mg/dL (ref 70–99)
Potassium: 5 mmol/L (ref 3.5–5.2)
Sodium: 140 mmol/L (ref 134–144)
Total Protein: 6.3 g/dL (ref 6.0–8.5)
eGFR: 27 mL/min/{1.73_m2} — ABNORMAL LOW (ref >59)

## 2024-04-21 LAB — CBC WITH DIFFERENTIAL/PLATELET
Basophils Absolute: 0 10*3/uL (ref 0.0–0.2)
Basos: 0 %
EOS (ABSOLUTE): 0.2 10*3/uL (ref 0.0–0.4)
Eos: 3 %
Hematocrit: 35.4 % (ref 34.0–46.6)
Hemoglobin: 11.4 g/dL (ref 11.1–15.9)
Immature Grans (Abs): 0 10*3/uL (ref 0.0–0.1)
Immature Granulocytes: 0 %
Lymphocytes Absolute: 1.6 10*3/uL (ref 0.7–3.1)
Lymphs: 22 %
MCH: 31.3 pg (ref 26.6–33.0)
MCHC: 32.2 g/dL (ref 31.5–35.7)
MCV: 97 fL (ref 79–97)
Monocytes Absolute: 0.5 10*3/uL (ref 0.1–0.9)
Monocytes: 7 %
Neutrophils Absolute: 5 10*3/uL (ref 1.4–7.0)
Neutrophils: 68 %
Platelets: 150 10*3/uL (ref 150–450)
RBC: 3.64 x10E6/uL — ABNORMAL LOW (ref 3.77–5.28)
RDW: 13.1 % (ref 11.7–15.4)
WBC: 7.5 10*3/uL (ref 3.4–10.8)

## 2024-04-21 LAB — LIPID PANEL W/O CHOL/HDL RATIO
Cholesterol, Total: 124 mg/dL (ref 100–199)
HDL: 37 mg/dL — ABNORMAL LOW (ref >39)
LDL Chol Calc (NIH): 69 mg/dL (ref 0–99)
Triglycerides: 93 mg/dL (ref 0–149)
VLDL Cholesterol Cal: 18 mg/dL (ref 5–40)

## 2024-04-23 NOTE — Progress Notes (Signed)
 Patient notified

## 2024-04-28 DIAGNOSIS — E876 Hypokalemia: Secondary | ICD-10-CM | POA: Diagnosis not present

## 2024-04-28 DIAGNOSIS — N2581 Secondary hyperparathyroidism of renal origin: Secondary | ICD-10-CM | POA: Diagnosis not present

## 2024-04-28 DIAGNOSIS — D631 Anemia in chronic kidney disease: Secondary | ICD-10-CM | POA: Diagnosis not present

## 2024-04-28 DIAGNOSIS — N1832 Chronic kidney disease, stage 3b: Secondary | ICD-10-CM | POA: Diagnosis not present

## 2024-04-28 DIAGNOSIS — I129 Hypertensive chronic kidney disease with stage 1 through stage 4 chronic kidney disease, or unspecified chronic kidney disease: Secondary | ICD-10-CM | POA: Diagnosis not present

## 2024-04-30 ENCOUNTER — Ambulatory Visit: Admitting: Endocrinology

## 2024-05-12 ENCOUNTER — Ambulatory Visit

## 2024-05-19 NOTE — Telephone Encounter (Signed)
 Canceled appointment 05/12/24

## 2024-05-22 ENCOUNTER — Ambulatory Visit: Admitting: Internal Medicine

## 2024-05-31 ENCOUNTER — Other Ambulatory Visit: Payer: Self-pay | Admitting: Internal Medicine

## 2024-06-05 ENCOUNTER — Ambulatory Visit (INDEPENDENT_AMBULATORY_CARE_PROVIDER_SITE_OTHER): Admitting: Cardiovascular Disease

## 2024-06-05 ENCOUNTER — Encounter: Payer: Self-pay | Admitting: Cardiovascular Disease

## 2024-06-05 VITALS — BP 186/84 | HR 57 | Ht 65.0 in | Wt 170.0 lb

## 2024-06-05 DIAGNOSIS — R0789 Other chest pain: Secondary | ICD-10-CM

## 2024-06-05 DIAGNOSIS — R0602 Shortness of breath: Secondary | ICD-10-CM

## 2024-06-05 DIAGNOSIS — I340 Nonrheumatic mitral (valve) insufficiency: Secondary | ICD-10-CM

## 2024-06-05 DIAGNOSIS — I10 Essential (primary) hypertension: Secondary | ICD-10-CM

## 2024-06-05 DIAGNOSIS — N1832 Chronic kidney disease, stage 3b: Secondary | ICD-10-CM | POA: Diagnosis not present

## 2024-06-05 DIAGNOSIS — K219 Gastro-esophageal reflux disease without esophagitis: Secondary | ICD-10-CM

## 2024-06-05 DIAGNOSIS — E782 Mixed hyperlipidemia: Secondary | ICD-10-CM | POA: Diagnosis not present

## 2024-06-05 MED ORDER — ATORVASTATIN CALCIUM 40 MG PO TABS: ORAL_TABLET | ORAL | 1 refills | Status: DC

## 2024-06-05 NOTE — Addendum Note (Signed)
 Addended by: FERNAND ALTER A on: 06/05/2024 10:52 AM   Modules accepted: Orders

## 2024-06-05 NOTE — Progress Notes (Addendum)
 Cardiology Office Note   Date:  06/05/2024   ID:  Elaine Norris, Elaine Norris [birth year], MRN [id number]  PCP:  Fernand Fredy RAMAN, MD  Cardiologist:  Denyse Fernand, MD      History of Present Illness: Elaine Norris is a 78 y.o. female who presents for  Chief Complaint  Patient presents with   Follow-up    3 Months Follow Up    This is a 78 year old white female with a history of GI reflux anxiety and myocardial infarction presented to the office with tightness in the chest shortness of breath and jaw pain.  She states she got worse while she was being put into the room S when she had an episode of tightness in the chest.      Past Medical History:  Diagnosis Date   Anxiety    Related to surgery   Arthritis    hands   Cancer (HCC) 1970's   cervical   Depression    Controlled   Esophagitis    GERD (gastroesophageal reflux disease)    Gout    Hyperlipidemia    Hypertension    Kidney carcinoma (HCC) 06/2020   Myocardial infarction (HCC)    Osteoporosis    feet   Pseudoseizure    Sleep apnea    CPAP     Past Surgical History:  Procedure Laterality Date   CARDIAC CATHETERIZATION  05/2011   [facility]   CARD[facility]AC [facility]ATION  09/2011   [facility]   CARDIAC CAT[facility]IZATION  2012   [facility]   CATARACT EXTRACTION W/PHACO Left 02/17/2018   Procedure: CATARACT EXTRACTION PHACO AND INTRAOCULAR LENS PLACEMENT (IOC) LEFT;  Surgeon: Mittie Gaskin, MD;  Location: [facility];  Service: Ophthalmology;  Laterality: Left;   CATARACT EXTRACTION W/PHACO Right 03/10/2018   Procedure: CATARACT EXTRACTION PHACO AND INTRAOCULAR LENS PLACEMENT (IOC) RIGHT  COMPLICATED;  Surgeon: Mittie Gaskin, MD;  Location: [facility];  Service: Ophthalmology;  Laterality: Right;  NEEDS EARLY AM 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 malyuhin   CHOLECYSTECTOMY N/A 06/26/2018   Procedure: LAPAROSCOPIC CHOLECYSTECTOMY;  Surgeon: Stevie Herlene Righter, MD;  Location: [facility];  Service:  General;  Laterality: N/A;   COLONOSCOPY     COLONOSCOPY WITH PROPOFOL  N/A 01/21/2020   Procedure: COLONOSCOPY WITH PROPOFOL ;  Surgeon: Toledo, Ladell POUR, MD;  Location: [facility];  Service: Gastroenterology;  Laterality: N/A;   ESOPHAGOGASTRODUODENOSCOPY (EGD) WITH PROPOFOL  N/A 01/21/2020   Procedure: ESOPHAGOGASTRODUODENOSCOPY (EGD) WITH PROPOFOL ;  Surgeon: Toledo, Ladell POUR, MD;  Location: [facility];  Service: Gastroenterology;  Laterality: N/A;   MULTIPLE TOOTH EXTRACTIONS     TONSILLECTOMY     WISDOM TOOTH EXTRACTION       Current Outpatient Medications  Medication Sig Dispense Refill   acetaminophen  (TYLENOL ) 500 MG tablet Take 500-1,000 mg by mouth every 6 (six) hours as needed for mild pain or fever.      allopurinol  (ZYLOPRIM ) 100 MG tablet TAKE 1 TABLET BY MOUTH EVERY DAY 90 tablet 3   aspirin  81 MG tablet Take 81 mg by mouth at bedtime.      baclofen  (LIORESAL ) 10 MG tablet TAKE 1 TABLET BY MOUTH TWICE DAILY AS NEEDED 60 tablet 1   calcitRIOL (ROCALTROL) 0.25 MCG capsule Take 0.25 mcg by mouth daily.     calcium -vitamin D  250-100 MG-UNIT tablet Take 1 tablet by mouth 2 (two) times daily.     citalopram  (CELEXA ) 20 MG tablet Take 1 tablet (20 mg total) by mouth daily. 90 tablet 0  EPINEPHrine  0.3 mg/0.3 mL IJ SOAJ injection Inject 0.3 mg into the muscle as needed for anaphylaxis. 1 each 2   FARXIGA  10 MG TABS tablet Take 1 tablet (10 mg total) by mouth every morning. 90 tablet 1   furosemide  (LASIX ) 40 MG tablet Take 1 tablet (40 mg total) by mouth daily. 90 tablet 1   hydrALAZINE  (APRESOLINE ) 100 MG tablet Take 1 tablet (100 mg total) by mouth 3 (three) times daily. (Patient taking differently: Take 100 mg by mouth 2 (two) times daily.) 90 tablet 3   labetalol  (NORMODYNE ) 100 MG tablet TAKE 1 TABLET BY MOUTH TWICE DAILY 180 tablet 1   losartan  (COZAAR ) 100 MG tablet Take 1 tablet (100 mg total) by mouth daily. 90 tablet 3   Multiple Vitamin (MULTIVITAMIN WITH MINERALS)  TABS tablet Take 1 tablet by mouth daily.     nitroGLYCERIN  (NITROSTAT ) 0.4 MG SL tablet Place 1 tablet (0.4 mg total) under the tongue every 5 (five) minutes as needed for chest pain. 25 tablet 6   pantoprazole  (PROTONIX ) 40 MG tablet TAKE 1 TABLET BY MOUTH EVERY DAY 90 tablet 1   spironolactone (ALDACTONE) 25 MG tablet Take 25 mg by mouth daily.     atorvastatin  (LIPITOR) 40 MG tablet TAKE 1 TABLET BY MOUTH EVERY DAY. MAKE. FOLLOW UP APPOINTMENT WITH DOCTOR Shalia Bartko FOR REFILLS** 90 tablet 1   Current Facility-Administered Medications  Medication Dose Route Frequency Provider Last Rate Last Admin   denosumab  (PROLIA ) injection 60 mg  60 mg Subcutaneous Once Thapa, [place], MD        Allergies:   Fire ant, Gabapentin, Albuterol, and Codeine    Social History:   reports that she has never smoked. She has been exposed to tobacco smoke. She has never used smokeless tobacco. She reports that she does not drink alcohol and does not use drugs.   Family History:  family history includes Heart disease in her mother; Heart failure in her father.    ROS:     ROS    All other systems are reviewed and negative.    PHYSICAL EXAM: VS:  BP (!) 186/84   Pulse (!) 57   Ht 5' 5 (1.651 m)   Wt 170 lb (77.1 kg)   SpO2 99%   BMI 28.29 kg/m  , BMI Body mass index is 28.29 kg/m. Last weight:  Wt Readings from Last 3 Encounters:  06/05/24 170 lb (77.1 kg)  04/20/24 171 lb 12.8 oz (77.9 kg)  02/21/24 168 lb (76.2 kg)     Physical Exam    EKG: NSR 60/min no acute changes  Recent Labs: 08/19/2023: Magnesium 2.0 04/20/2024: ALT 17; BUN 29; Creatinine, Ser 1.90; Hemoglobin 11.4; Platelets 150; Potassium 5.0; Sodium 140    Lipid Panel    Component Value Date/Time   CHOL 124 04/20/2024 1141   TRIG 93 04/20/2024 1141   HDL 37 (L) 04/20/2024 1141   LDLCALC 69 04/20/2024 1141      Other studies Reviewed: Additional studies/ records that were reviewed today include:  Review of the above  records demonstrates:       No data to display            ASSESSMENT AND PLAN:    ICD-10-CM   1. Other chest pain  R07.89 PCV ECHOCARDIOGRAM COMPLETE    atorvastatin  (LIPITOR) 40 MG tablet   NSR 60/min no acute changes on EKG, tightness gone now.More neck pain. Stress test normal 3/25, CRI, not good idea to do  cath.    2. Nonrheumatic mitral valve regurgitation  I34.0 PCV ECHOCARDIOGRAM COMPLETE    atorvastatin  (LIPITOR) 40 MG tablet   repeat echo    3. Gastroesophageal reflux disease without esophagitis  K21.9 PCV ECHOCARDIOGRAM COMPLETE    atorvastatin  (LIPITOR) 40 MG tablet   protonix  bid    4. Mixed hyperlipidemia  E78.2 PCV ECHOCARDIOGRAM COMPLETE    atorvastatin  (LIPITOR) 40 MG tablet    5. Stage 3b chronic kidney disease (HCC)  N18.32 PCV ECHOCARDIOGRAM COMPLETE    atorvastatin  (LIPITOR) 40 MG tablet    6. Essential hypertension, benign  I10 PCV ECHOCARDIOGRAM COMPLETE    atorvastatin  (LIPITOR) 40 MG tablet   Repeat BP 140/80    7. SOB (shortness of breath)  R06.02 PCV ECHOCARDIOGRAM COMPLETE    atorvastatin  (LIPITOR) 40 MG tablet       Problem List Items Addressed This Visit       Cardiovascular and Mediastinum   Essential hypertension, benign   Relevant Medications   atorvastatin  (LIPITOR) 40 MG tablet   Other Relevant Orders   PCV ECHOCARDIOGRAM COMPLETE   Nonrheumatic mitral valve regurgitation   Relevant Medications   atorvastatin  (LIPITOR) 40 MG tablet   Other Relevant Orders   PCV ECHOCARDIOGRAM COMPLETE     Digestive   GERD (gastroesophageal reflux disease)   Relevant Medications   atorvastatin  (LIPITOR) 40 MG tablet   Other Relevant Orders   PCV ECHOCARDIOGRAM COMPLETE     Genitourinary   Stage 3b chronic kidney disease (HCC)   Relevant Medications   atorvastatin  (LIPITOR) 40 MG tablet   Other Relevant Orders   PCV ECHOCARDIOGRAM COMPLETE     Other   Chest pain - Primary   Relevant Medications   atorvastatin  (LIPITOR) 40 MG  tablet   Other Relevant Orders   PCV ECHOCARDIOGRAM COMPLETE   Hyperlipidemia   Relevant Medications   atorvastatin  (LIPITOR) 40 MG tablet   Other Relevant Orders   PCV ECHOCARDIOGRAM COMPLETE   Other Visit Diagnoses       SOB (shortness of breath)       Relevant Medications   atorvastatin  (LIPITOR) 40 MG tablet   Other Relevant Orders   PCV ECHOCARDIOGRAM COMPLETE          Disposition:   Return in about 4 weeks (around 07/03/2024) for echo and f/u.    Total time spent: 40 minutes  Signed,  Denyse Bathe, MD  06/05/2024 10:51 AM    [facility]

## 2024-06-08 ENCOUNTER — Encounter: Payer: Self-pay | Admitting: Cardiovascular Disease

## 2024-06-13 NOTE — Telephone Encounter (Signed)
 Pt > 30 days PAST due for PROLIA   If you would like for pt to continue with Prolia  therapy, please have clinical staff reach out to pt for scheduling and to explain to importance of receiving Prolia  injections every 6 months as abrupt cessation of Prolia  raises risk of osteoporotic fracture.    Discontinuation of Dmab is associated with a 3- to 5-fold higher risk for vertebral, major osteoporotic, and hip fractures [38,39].   [email or web address]

## 2024-06-15 ENCOUNTER — Telehealth: Payer: Self-pay

## 2024-06-15 NOTE — Telephone Encounter (Signed)
 Patient past due on Prolia  injection. Called patient who states her PCP is currently working on trying to get someone local to her to provide injection as she said she has trouble getting to [facility] is why she has not been back for next injection. Advised patient to call PCP office and make them aware she is now past due to determine the next steps for patient.

## 2024-06-17 ENCOUNTER — Telehealth: Payer: Self-pay | Admitting: Endocrinology

## 2024-06-17 NOTE — Telephone Encounter (Signed)
 NA

## 2024-06-17 NOTE — Telephone Encounter (Deleted)
 NA

## 2024-06-17 NOTE — Telephone Encounter (Signed)
 Patient called this morning stating she wants to continue with receiving her Prolia  injections,however she is having difficulties getting to [facility] as previously stated.She has contacted her PCP for help but the provider has been out of the office.She stated her pain level is an 8 out 10 and would like to have the injection sooner than later.

## 2024-06-18 ENCOUNTER — Telehealth: Payer: Self-pay | Admitting: Internal Medicine

## 2024-06-18 ENCOUNTER — Telehealth: Payer: Self-pay

## 2024-06-18 NOTE — Telephone Encounter (Signed)
Patient left VM requesting a call back. Did not state what she needs.

## 2024-06-18 NOTE — Telephone Encounter (Signed)
 Called patient back to schedule prolia  injection,stated she could not afford the out of pocket cost the injection.

## 2024-06-18 NOTE — Telephone Encounter (Signed)
 Patient called to schedule appointment to get Prolia  injection until she can hear from her PCP. Patient transferred to front desk.

## 2024-07-03 ENCOUNTER — Ambulatory Visit

## 2024-07-03 DIAGNOSIS — I371 Nonrheumatic pulmonary valve insufficiency: Secondary | ICD-10-CM | POA: Diagnosis not present

## 2024-07-03 DIAGNOSIS — I10 Essential (primary) hypertension: Secondary | ICD-10-CM

## 2024-07-03 DIAGNOSIS — E782 Mixed hyperlipidemia: Secondary | ICD-10-CM

## 2024-07-03 DIAGNOSIS — I361 Nonrheumatic tricuspid (valve) insufficiency: Secondary | ICD-10-CM

## 2024-07-03 DIAGNOSIS — I340 Nonrheumatic mitral (valve) insufficiency: Secondary | ICD-10-CM | POA: Diagnosis not present

## 2024-07-03 DIAGNOSIS — N1832 Chronic kidney disease, stage 3b: Secondary | ICD-10-CM

## 2024-07-03 DIAGNOSIS — K219 Gastro-esophageal reflux disease without esophagitis: Secondary | ICD-10-CM

## 2024-07-03 DIAGNOSIS — R0789 Other chest pain: Secondary | ICD-10-CM

## 2024-07-03 DIAGNOSIS — R0602 Shortness of breath: Secondary | ICD-10-CM

## 2024-07-10 ENCOUNTER — Encounter: Payer: Self-pay | Admitting: Cardiovascular Disease

## 2024-07-10 ENCOUNTER — Ambulatory Visit: Admitting: Cardiovascular Disease

## 2024-07-10 VITALS — BP 108/62 | HR 57 | Ht 65.0 in | Wt 174.4 lb

## 2024-07-10 DIAGNOSIS — R0602 Shortness of breath: Secondary | ICD-10-CM

## 2024-07-10 DIAGNOSIS — E782 Mixed hyperlipidemia: Secondary | ICD-10-CM

## 2024-07-10 DIAGNOSIS — I340 Nonrheumatic mitral (valve) insufficiency: Secondary | ICD-10-CM

## 2024-07-10 DIAGNOSIS — I10 Essential (primary) hypertension: Secondary | ICD-10-CM | POA: Diagnosis not present

## 2024-07-10 NOTE — Progress Notes (Signed)
 Cardiology Office Note   Date:  07/10/2024   ID:  Sabria, Florido Jul 15, 1946, MRN [id number]  PCP:  Fernand Fredy RAMAN, MD  Cardiologist:  Denyse Fernand, MD      History of Present Illness: Elaine Norris is a 78 y.o. female who presents for  Chief Complaint  Patient presents with   Follow-up    4 week follow up    Had back pains and SOB. Feels better today.      Past Medical History:  Diagnosis Date   Anxiety    Related to surgery   Arthritis    hands   Cancer (HCC) 1970's   cervical   Depression    Controlled   Esophagitis    GERD (gastroesophageal reflux disease)    Gout    Hyperlipidemia    Hypertension    Kidney carcinoma (HCC) 06/2020   Myocardial infarction (HCC)    Osteoporosis    feet   Pseudoseizure    Sleep apnea    CPAP     Past Surgical History:  Procedure Laterality Date   CARDIAC CATHETERIZATION  05/2011   [facility]   C[facility]  09/2011   [facility]   CARD[facility]AC [facility]IZATION  2012   [facility]   CATARACT EXTRACTION W/PHACO Left 02/17/2018   Procedure: CATARACT EXTRACTION PHACO AND INTRAOCULAR LENS PLACEMENT (IOC) LEFT;  Surgeon: Mittie Gaskin, MD;  Location: [facility];  Service: Ophthalmology;  Laterality: Left;   CATARACT EXTRACTION W/PHACO Right 03/10/2018   Procedure: CATARACT EXTRACTION PHACO AND INTRAOCULAR LENS PLACEMENT (IOC) RIGHT  COMPLICATED;  Surgeon: Mittie Gaskin, MD;  Location: [facility];  Service: Ophthalmology;  Laterality: Right;  NEEDS EARLY AM 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 malyuhin   CHOLECYSTECTOMY N/A 06/26/2018   Procedure: LAPAROSCOPIC CHOLECYSTECTOMY;  Surgeon: Stevie Herlene Righter, MD;  Location: [facility];  Service: General;  Laterality: N/A;   COLONOSCOPY     COLONOSCOPY WITH PROPOFOL  N/A 01/21/2020   Procedure: COLONOSCOPY WITH PROPOFOL ;  Surgeon: Toledo, Ladell POUR, MD;  Location: [facility];  Service: Gastroenterology;  Laterality: N/A;   ESOPHAGOGASTRODUODENOSCOPY  (EGD) WITH PROPOFOL  N/A 01/21/2020   Procedure: ESOPHAGOGASTRODUODENOSCOPY (EGD) WITH PROPOFOL ;  Surgeon: Toledo, Ladell POUR, MD;  Location: [facility];  Service: Gastroenterology;  Laterality: N/A;   MULTIPLE TOOTH EXTRACTIONS     TONSILLECTOMY     WISDOM TOOTH EXTRACTION       Current Outpatient Medications  Medication Sig Dispense Refill   acetaminophen  (TYLENOL ) 500 MG tablet Take 500-1,000 mg by mouth every 6 (six) hours as needed for mild pain or fever.      allopurinol  (ZYLOPRIM ) 100 MG tablet TAKE 1 TABLET BY MOUTH EVERY DAY 90 tablet 3   aspirin  81 MG tablet Take 81 mg by mouth at bedtime.      atorvastatin  (LIPITOR) 40 MG tablet TAKE 1 TABLET BY MOUTH EVERY DAY. MAKE. FOLLOW UP APPOINTMENT WITH DOCTOR Alcus Bradly FOR REFILLS** 90 tablet 1   baclofen  (LIORESAL ) 10 MG tablet TAKE 1 TABLET BY MOUTH TWICE DAILY AS NEEDED 60 tablet 1   calcitRIOL (ROCALTROL) 0.25 MCG capsule Take 0.25 mcg by mouth daily.     calcium -vitamin D  250-100 MG-UNIT tablet Take 1 tablet by mouth 2 (two) times daily.     citalopram  (CELEXA ) 20 MG tablet Take 1 tablet (20 mg total) by mouth daily. 90 tablet 0   EPINEPHrine  0.3 mg/0.3 mL IJ SOAJ injection Inject 0.3 mg into the muscle as needed for anaphylaxis. 1 each 2  FARXIGA  10 MG TABS tablet Take 1 tablet (10 mg total) by mouth every morning. 90 tablet 1   furosemide  (LASIX ) 40 MG tablet Take 1 tablet (40 mg total) by mouth daily. 90 tablet 1   hydrALAZINE  (APRESOLINE ) 100 MG tablet Take 1 tablet (100 mg total) by mouth 3 (three) times daily. (Patient taking differently: Take 100 mg by mouth 2 (two) times daily.) 90 tablet 3   labetalol  (NORMODYNE ) 100 MG tablet TAKE 1 TABLET BY MOUTH TWICE DAILY 180 tablet 1   losartan  (COZAAR ) 100 MG tablet Take 1 tablet (100 mg total) by mouth daily. 90 tablet 3   Multiple Vitamin (MULTIVITAMIN WITH MINERALS) TABS tablet Take 1 tablet by mouth daily.     nitroGLYCERIN  (NITROSTAT ) 0.4 MG SL tablet Place 1 tablet (0.4 mg total)  under the tongue every 5 (five) minutes as needed for chest pain. 25 tablet 6   pantoprazole  (PROTONIX ) 40 MG tablet TAKE 1 TABLET BY MOUTH EVERY DAY 90 tablet 1   spironolactone (ALDACTONE) 25 MG tablet Take 25 mg by mouth daily.     Current Facility-Administered Medications  Medication Dose Route Frequency Provider Last Rate Last Admin   denosumab  (PROLIA ) injection 60 mg  60 mg Subcutaneous Once Thapa, [place], MD        Allergies:   Fire ant, Gabapentin, Albuterol, and Codeine    Social History:   reports that she has never smoked. She has been exposed to tobacco smoke. She has never used smokeless tobacco. She reports that she does not drink alcohol and does not use drugs.   Family History:  family history includes Heart disease in her mother; Heart failure in her father.    ROS:     Review of Systems  Constitutional: Negative.   HENT: Negative.    Eyes: Negative.   Respiratory: Negative.    Gastrointestinal: Negative.   Genitourinary: Negative.   Musculoskeletal: Negative.   Skin: Negative.   Neurological: Negative.   Endo/Heme/Allergies: Negative.   Psychiatric/Behavioral: Negative.    All other systems reviewed and are negative.     All other systems are reviewed and negative.    PHYSICAL EXAM: VS:  BP 108/62   Pulse (!) 57   Ht 5' 5 (1.651 m)   Wt 174 lb 6.4 oz (79.1 kg)   SpO2 98%   BMI 29.02 kg/m  , BMI Body mass index is 29.02 kg/m. Last weight:  Wt Readings from Last 3 Encounters:  07/10/24 174 lb 6.4 oz (79.1 kg)  06/05/24 170 lb (77.1 kg)  04/20/24 171 lb 12.8 oz (77.9 kg)     Physical Exam Constitutional:      Appearance: Normal appearance.  Cardiovascular:     Rate and Rhythm: Normal rate and regular rhythm.     Heart sounds: Normal heart sounds.  Pulmonary:     Effort: Pulmonary effort is normal.     Breath sounds: Normal breath sounds.  Musculoskeletal:     Right lower leg: No edema.     Left lower leg: No edema.  Neurological:      Mental Status: She is alert.       EKG:   Recent Labs: 08/19/2023: Magnesium 2.0 04/20/2024: ALT 17; BUN 29; Creatinine, Ser 1.90; Hemoglobin 11.4; Platelets 150; Potassium 5.0; Sodium 140    Lipid Panel    Component Value Date/Time   CHOL 124 04/20/2024 1141   TRIG 93 04/20/2024 1141   HDL 37 (L) 04/20/2024 1141   LDLCALC 69 04/20/2024  1141      Other studies Reviewed: Additional studies/ records that were reviewed today include:  Review of the above records demonstrates:       No data to display            ASSESSMENT AND PLAN:    ICD-10-CM   1. SOB (shortness of breath)  R06.02    on exertion only. ECHo had diastolic dysfunction, on farxiga .    2. Essential hypertension, benign  I10     3. Nonrheumatic mitral valve regurgitation  I34.0     4. Mixed hyperlipidemia  E78.2        Problem List Items Addressed This Visit       Cardiovascular and Mediastinum   Essential hypertension, benign   Nonrheumatic mitral valve regurgitation     Other   Hyperlipidemia   Other Visit Diagnoses       SOB (shortness of breath)    -  Primary   on exertion only. ECHo had diastolic dysfunction, on farxiga .          Disposition:   Return in about 3 months (around 10/10/2024).    Total time spent: 30 minutes  Signed,  Denyse Bathe, MD  07/10/2024 9:49 AM    [facility]

## 2024-07-11 ENCOUNTER — Other Ambulatory Visit: Payer: Self-pay | Admitting: Cardiovascular Disease

## 2024-07-11 DIAGNOSIS — I340 Nonrheumatic mitral (valve) insufficiency: Secondary | ICD-10-CM

## 2024-07-11 DIAGNOSIS — R0789 Other chest pain: Secondary | ICD-10-CM

## 2024-07-11 DIAGNOSIS — E782 Mixed hyperlipidemia: Secondary | ICD-10-CM

## 2024-07-11 DIAGNOSIS — I10 Essential (primary) hypertension: Secondary | ICD-10-CM

## 2024-07-11 DIAGNOSIS — G4733 Obstructive sleep apnea (adult) (pediatric): Secondary | ICD-10-CM

## 2024-07-14 ENCOUNTER — Ambulatory Visit
Admission: RE | Admit: 2024-07-14 | Discharge: 2024-07-14 | Disposition: A | Discharge status: Home / Self Care | Source: Ambulatory Visit | Attending: Internal Medicine | Admitting: Internal Medicine

## 2024-07-14 DIAGNOSIS — Z1231 Encounter for screening mammogram for malignant neoplasm of breast: Secondary | ICD-10-CM | POA: Diagnosis not present

## 2024-07-21 ENCOUNTER — Ambulatory Visit: Admitting: Internal Medicine

## 2024-07-29 DIAGNOSIS — R8290 Unspecified abnormal findings in urine: Secondary | ICD-10-CM | POA: Diagnosis not present

## 2024-07-29 DIAGNOSIS — E876 Hypokalemia: Secondary | ICD-10-CM | POA: Diagnosis not present

## 2024-07-29 DIAGNOSIS — N2581 Secondary hyperparathyroidism of renal origin: Secondary | ICD-10-CM | POA: Diagnosis not present

## 2024-07-29 DIAGNOSIS — N1832 Chronic kidney disease, stage 3b: Secondary | ICD-10-CM | POA: Diagnosis not present

## 2024-07-29 DIAGNOSIS — D631 Anemia in chronic kidney disease: Secondary | ICD-10-CM | POA: Diagnosis not present

## 2024-07-29 DIAGNOSIS — M1A9XX1 Chronic gout, unspecified, with tophus (tophi): Secondary | ICD-10-CM | POA: Diagnosis not present

## 2024-07-29 DIAGNOSIS — I129 Hypertensive chronic kidney disease with stage 1 through stage 4 chronic kidney disease, or unspecified chronic kidney disease: Secondary | ICD-10-CM | POA: Diagnosis not present

## 2024-08-04 ENCOUNTER — Other Ambulatory Visit: Payer: Self-pay | Admitting: Internal Medicine

## 2024-08-12 DIAGNOSIS — R7309 Other abnormal glucose: Secondary | ICD-10-CM | POA: Diagnosis not present

## 2024-08-12 DIAGNOSIS — M810 Age-related osteoporosis without current pathological fracture: Secondary | ICD-10-CM | POA: Diagnosis not present

## 2024-08-13 NOTE — Telephone Encounter (Signed)
 Elaine Norris PARAS    Telephone Encounter Signed   Encounter Date: 06/18/2024   Signed      Called patient back to schedule prolia  injection,stated she could not afford the out of pocket cost the injection.

## 2024-08-13 NOTE — Telephone Encounter (Signed)
Pt archived in [email or web address].  Please advise if patient and/or provider wish to proceed with Prolia therpay.  

## 2024-08-20 ENCOUNTER — Ambulatory Visit: Admitting: Internal Medicine

## 2024-08-30 NOTE — Telephone Encounter (Signed)
 I am going to close this case. If pt decides to proceed with Prolia  through Endo, let me know and I will reverify.   Thanks for getting back to me!

## 2024-08-31 NOTE — Progress Notes (Signed)
 Elaine Norris                                          MRN: [id number]   08/31/2024   The [facility] Quality Team Specialist reviewed this patient medical record for the purposes of chart review for care gap closure. The following were reviewed: chart review for care gap closure-kidney health evaluation for diabetes:eGFR  and uACR.    [facility] Quality Team

## 2024-09-02 ENCOUNTER — Other Ambulatory Visit: Payer: Self-pay | Admitting: Internal Medicine

## 2024-09-03 ENCOUNTER — Ambulatory Visit: Payer: Self-pay | Admitting: Internal Medicine

## 2024-09-03 ENCOUNTER — Ambulatory Visit (INDEPENDENT_AMBULATORY_CARE_PROVIDER_SITE_OTHER): Admitting: Internal Medicine

## 2024-09-03 ENCOUNTER — Encounter: Payer: Self-pay | Admitting: Internal Medicine

## 2024-09-03 ENCOUNTER — Telehealth: Payer: Self-pay

## 2024-09-03 VITALS — BP 126/70 | HR 77 | Ht 65.0 in | Wt 172.4 lb

## 2024-09-03 DIAGNOSIS — N1832 Chronic kidney disease, stage 3b: Secondary | ICD-10-CM

## 2024-09-03 DIAGNOSIS — N184 Chronic kidney disease, stage 4 (severe): Secondary | ICD-10-CM | POA: Diagnosis not present

## 2024-09-03 DIAGNOSIS — R569 Unspecified convulsions: Secondary | ICD-10-CM

## 2024-09-03 DIAGNOSIS — K219 Gastro-esophageal reflux disease without esophagitis: Secondary | ICD-10-CM

## 2024-09-03 DIAGNOSIS — I5022 Chronic systolic (congestive) heart failure: Secondary | ICD-10-CM | POA: Insufficient documentation

## 2024-09-03 DIAGNOSIS — J069 Acute upper respiratory infection, unspecified: Secondary | ICD-10-CM

## 2024-09-03 DIAGNOSIS — E782 Mixed hyperlipidemia: Secondary | ICD-10-CM | POA: Diagnosis not present

## 2024-09-03 DIAGNOSIS — R5383 Other fatigue: Secondary | ICD-10-CM

## 2024-09-03 DIAGNOSIS — I10 Essential (primary) hypertension: Secondary | ICD-10-CM | POA: Diagnosis not present

## 2024-09-03 DIAGNOSIS — R7309 Other abnormal glucose: Secondary | ICD-10-CM | POA: Diagnosis not present

## 2024-09-03 LAB — POCT XPERT XPRESS SARS COVID-2/FLU/RSV
FLU A: NEGATIVE
FLU B: NEGATIVE
RSV RNA, PCR: NEGATIVE
SARS Coronavirus 2: NEGATIVE

## 2024-09-03 NOTE — Progress Notes (Signed)
 Established Patient Office Visit  Subjective:  Patient ID: Elaine Norris, female    DOB: [birth year]  Age: 78 y.o. MRN: [id number]  Chief Complaint  Patient presents with   Follow-up    3 month follow up, possible cold.    Patient comes in for follow up. Reports of 2 day history of sinus and nasal congestion, mild cough- but no fever and no body aches, no sore throat. No history of travel or exposure- Will check for covid/flu . Taking her medication regularly.Fasting for labs.    No other concerns at this time.   Past Medical History:  Diagnosis Date   Anxiety    Related to surgery   Arthritis    hands   Cancer (HCC) 1970's   cervical   Depression    Controlled   Esophagitis    GERD (gastroesophageal reflux disease)    Gout    Hyperlipidemia    Hypertension    Kidney carcinoma (HCC) 06/2020   Myocardial infarction (HCC)    Osteoporosis    feet   Pseudoseizure    Sleep apnea    CPAP    Past Surgical History:  Procedure Laterality Date   CARDIAC CATHETERIZATION  05/2011   [facility]   [facility]ARD[facility]  [facility]/2011   [facility]   CARDIAC [facility]IZATION  2012   [facility]   CATARACT EXTRACTION W/PHACO Left 02/17/2018   Procedure: CATARACT EXTRACTION PHACO AND INTRAOCULAR LENS PLACEMENT (IOC) LEFT;  Surgeon: Mittie Gaskin, MD;  Location: [facility];  Service: Ophthalmology;  Laterality: Left;   CATARACT EXTRACTION W/PHACO Right 03/10/2018   Procedure: CATARACT EXTRACTION PHACO AND INTRAOCULAR LENS PLACEMENT (IOC) RIGHT  COMPLICATED;  Surgeon: Mittie Gaskin, MD;  Location: [facility];  Service: Ophthalmology;  Laterality: Right;  NEEDS EARLY AM 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 malyuhin   CHOLECYSTECTOMY N/A 06/26/2018   Procedure: LAPAROSCOPIC CHOLECYSTECTOMY;  Surgeon: Stevie Herlene Righter, MD;  Location: [facility];  Service: General;  Laterality: N/A;   COLONOSCOPY     COLONOSCOPY WITH PROPOFOL  N/A 01/21/2020   Procedure: COLONOSCOPY WITH  PROPOFOL ;  Surgeon: Toledo, Ladell POUR, MD;  Location: [facility];  Service: Gastroenterology;  Laterality: N/A;   ESOPHAGOGASTRODUODENOSCOPY (EGD) WITH PROPOFOL  N/A 01/21/2020   Procedure: ESOPHAGOGASTRODUODENOSCOPY (EGD) WITH PROPOFOL ;  Surgeon: Toledo, Ladell POUR, MD;  Location: [facility];  Service: Gastroenterology;  Laterality: N/A;   MULTIPLE TOOTH EXTRACTIONS     TONSILLECTOMY     WISDOM TOOTH EXTRACTION      Social History   Socioeconomic History   Marital status: Widowed    Spouse name: Not on file   Number of children: Not on file   Years of education: Not on file   Highest education level: Not on file  Occupational History   Occupation: retied  Tobacco Use   Smoking status: Never    Passive exposure: Yes   Smokeless tobacco: Never  Vaping Use   Vaping status: Never Used  Substance and Sexual Activity   Alcohol use: No   Drug use: No   Sexual activity: Not Currently  Other Topics Concern   Not on file  Social History Narrative   Not on file   Social Drivers of Health   Financial Resource Strain: High Risk (08/12/2024)   Received from [facility]   Overall Financial Resource Strain (CARDIA)    Difficulty of Paying Living Expenses: Hard  Food Insecurity: No Food Insecurity (08/12/2024)   Received from [facility]   Hunger  Vital Sign    Within the past 12 months, you worried that your food would run out before you got the money to buy more.: Never true    Within the past 12 months, the food you bought just didn't last and you didn't have money to get more.: Never true  Transportation Needs: No Transportation Needs (08/12/2024)   Received from [facility] - Transportation    In the past 12 months, has lack of transportation kept you from medical appointments or from getting medications?: No    Lack of Transportation (Non-Medical): No  Physical Activity: Unknown (06/19/2018)   Exercise Vital Sign     Days of Exercise per Week: 2 days    Minutes of Exercise per Session: Not on file  Stress: No Stress Concern Present (06/19/2018)   [employer] of Occupational Health - Occupational Stress Questionnaire    Feeling of Stress : Only a little  Social Connections: Moderately Isolated (06/19/2018)   Social Connection and Isolation Panel    Frequency of Communication with Friends and Family: Not on file    Frequency of Social Gatherings with Friends and Family: More than three times a week    Attends Religious Services: Never    Database administrator or Organizations: No    Attends Banker Meetings: Never    Marital Status: Divorced  Catering manager Violence: Not At Risk (06/19/2018)   Humiliation, Afraid, Rape, and Kick questionnaire    Fear of Current or Ex-Partner: No    Emotionally Abused: No    Physically Abused: No    Sexually Abused: No    Family History  Problem Relation Age of Onset   Heart disease Mother    Heart failure Father    Breast cancer Neg Hx     Allergies  Allergen Reactions   Fire Ant Anaphylaxis   Gabapentin     Muscles jerks, weakness and difficulty swallowing - Noted in hospital stay   Albuterol Other (See Comments)    Pt reports seizures    Codeine Other (See Comments)    Altered mental status     Outpatient Medications Prior to Visit  Medication Sig   acetaminophen  (TYLENOL ) 500 MG tablet Take 500-1,000 mg by mouth every 6 (six) hours as needed for mild pain or fever.    allopurinol  (ZYLOPRIM ) 100 MG tablet TAKE 1 TABLET BY MOUTH EVERY DAY   aspirin  81 MG tablet Take 81 mg by mouth at bedtime.    atorvastatin  (LIPITOR) 40 MG tablet TAKE 1 TABLET BY MOUTH EVERY DAY. MAKE. FOLLOW UP APPOINTMENT WITH DOCTOR Lavarius Doughten FOR REFILLS**   baclofen  (LIORESAL ) 10 MG tablet TAKE 1 TABLET BY MOUTH TWICE DAILY AS NEEDED   calcitRIOL (ROCALTROL) 0.25 MCG capsule Take 0.25 mcg by mouth daily.   calcium -vitamin D  250-100 MG-UNIT tablet Take 1 tablet  by mouth 2 (two) times daily.   citalopram  (CELEXA ) 20 MG tablet Take 1 tablet (20 mg total) by mouth daily.   EPINEPHrine  0.3 mg/0.3 mL IJ SOAJ injection Inject 0.3 mg into the muscle as needed for anaphylaxis.   FARXIGA  10 MG TABS tablet Take 1 tablet (10 mg total) by mouth every morning.   furosemide  (LASIX ) 40 MG tablet Take 1 tablet (40 mg total) by mouth daily.   hydrALAZINE  (APRESOLINE ) 100 MG tablet TAKE 1 TABLET(100 MG) BY MOUTH THREE TIMES DAILY   labetalol  (NORMODYNE ) 100 MG tablet TAKE 1 TABLET BY MOUTH TWICE DAILY   losartan  (  COZAAR ) 100 MG tablet Take 1 tablet (100 mg total) by mouth daily.   Multiple Vitamin (MULTIVITAMIN WITH MINERALS) TABS tablet Take 1 tablet by mouth daily.   nitroGLYCERIN  (NITROSTAT ) 0.4 MG SL tablet Place 1 tablet (0.4 mg total) under the tongue every 5 (five) minutes as needed for chest pain.   pantoprazole  (PROTONIX ) 40 MG tablet TAKE 1 TABLET BY MOUTH EVERY DAY   spironolactone (ALDACTONE) 25 MG tablet Take 25 mg by mouth daily.   Facility-Administered Medications Prior to Visit  Medication Dose Route Frequency Provider   denosumab  (PROLIA ) injection 60 mg  60 mg Subcutaneous Once Thapa, [place], MD    Review of Systems  Constitutional: Negative.  Negative for chills, fever and malaise/fatigue.  HENT: Negative.  Negative for congestion and sore throat.   Eyes: Negative.  Negative for blurred vision and pain.  Respiratory: Negative.  Negative for cough and shortness of breath.   Cardiovascular: Negative.  Negative for chest pain, palpitations and leg swelling.  Gastrointestinal: Negative.  Negative for abdominal pain, blood in stool, constipation, diarrhea, heartburn, melena, nausea and vomiting.  Genitourinary: Negative.  Negative for dysuria, flank pain, frequency and urgency.  Musculoskeletal: Negative.  Negative for joint pain and myalgias.  Skin: Negative.   Neurological: Negative.  Negative for dizziness, tingling, sensory change, weakness and  headaches.  Endo/Heme/Allergies: Negative.   Psychiatric/Behavioral: Negative.  Negative for depression and suicidal ideas. The patient is not nervous/anxious.        Objective:   BP 126/70   Pulse 77   Ht 5' 5 (1.651 m)   Wt 172 lb 6.4 oz (78.2 kg)   SpO2 98%   BMI 28.69 kg/m   Vitals:   09/03/24 1342  BP: 126/70  Pulse: 77  Height: 5' 5 (1.651 m)  Weight: 172 lb 6.4 oz (78.2 kg)  SpO2: 98%  BMI (Calculated): 28.69    Physical Exam Vitals and nursing note reviewed.  Constitutional:      Appearance: Normal appearance.  HENT:     Head: Normocephalic and atraumatic.     Nose: Nose normal.     Mouth/Throat:     Mouth: Mucous membranes are moist.     Pharynx: Oropharynx is clear.  Eyes:     Conjunctiva/sclera: Conjunctivae normal.     Pupils: Pupils are equal, round, and reactive to light.  Cardiovascular:     Rate and Rhythm: Normal rate and regular rhythm.     Pulses: Normal pulses.     Heart sounds: Normal heart sounds. No murmur heard. Pulmonary:     Effort: Pulmonary effort is normal.     Breath sounds: Normal breath sounds. No wheezing.  Abdominal:     General: Bowel sounds are normal.     Palpations: Abdomen is soft.     Tenderness: There is no abdominal tenderness. There is no right CVA tenderness or left CVA tenderness.  Musculoskeletal:        General: Normal range of motion.     Cervical back: Normal range of motion.     Right lower leg: No edema.     Left lower leg: No edema.  Skin:    General: Skin is warm and dry.  Neurological:     General: No focal deficit present.     Mental Status: She is alert and oriented to person, place, and time.  Psychiatric:        Mood and Affect: Mood normal.        Behavior: Behavior normal.  No results found for any visits on 09/03/24.  No results found for this or any previous visit (from the past 2160 hours).    Assessment & Plan:  Check Covid- if negative then rest ,fluids and tylenol . Check  labs today. Problem List Items Addressed This Visit     Essential hypertension, benign   Relevant Orders   CMP14+EGFR   Hyperlipidemia   Relevant Orders   Lipid Panel w/o Chol/HDL Ratio   Seizure (HCC)   GERD (gastroesophageal reflux disease)   Relevant Orders   CBC with Diff   Stage 3b chronic kidney disease (HCC)   Relevant Orders   CMP14+EGFR   Chronic kidney disease, stage 4 (severe) (HCC) - Primary   Chronic systolic heart failure (HCC)   Elevated glucose   Relevant Orders   Hemoglobin A1c    Follow up 3 months.   Total time spent: 30 minutes. This time includes review of previous notes and results and patient face to face interaction during today's visit.    FERNAND FREDY RAMAN, MD  09/03/2024   This document may have been prepared by [facility] Voice Recognition software and as such may include unintentional dictation errors.

## 2024-09-03 NOTE — Addendum Note (Signed)
 Addended by: KRISTINE NORRIS on: 09/03/2024 02:54 PM   Modules accepted: Orders

## 2024-09-04 LAB — CMP14+EGFR
ALT: 19 IU/L (ref 0–32)
AST: 19 IU/L (ref 0–40)
Albumin: 4.2 g/dL (ref 3.8–4.8)
Alkaline Phosphatase: 83 IU/L (ref 49–135)
BUN/Creatinine Ratio: 19 (ref 12–28)
BUN: 36 mg/dL — ABNORMAL HIGH (ref 8–27)
Bilirubin Total: 0.3 mg/dL (ref 0.0–1.2)
CO2: 20 mmol/L (ref 20–29)
Calcium: 10.1 mg/dL (ref 8.7–10.3)
Chloride: 105 mmol/L (ref 96–106)
Creatinine, Ser: 1.92 mg/dL — ABNORMAL HIGH (ref 0.57–1.00)
Globulin, Total: 2.4 g/dL (ref 1.5–4.5)
Glucose: 100 mg/dL — ABNORMAL HIGH (ref 70–99)
Potassium: 4.8 mmol/L (ref 3.5–5.2)
Sodium: 139 mmol/L (ref 134–144)
Total Protein: 6.6 g/dL (ref 6.0–8.5)
eGFR: 26 mL/min/1.73 — ABNORMAL LOW (ref >59)

## 2024-09-04 LAB — CBC WITH DIFFERENTIAL/PLATELET
Basophils Absolute: 0.1 x10E3/uL (ref 0.0–0.2)
Basos: 1 %
EOS (ABSOLUTE): 0.2 x10E3/uL (ref 0.0–0.4)
Eos: 2 %
Hematocrit: 32.6 % — ABNORMAL LOW (ref 34.0–46.6)
Hemoglobin: 10.8 g/dL — ABNORMAL LOW (ref 11.1–15.9)
Immature Grans (Abs): 0 x10E3/uL (ref 0.0–0.1)
Immature Granulocytes: 0 %
Lymphocytes Absolute: 1.6 x10E3/uL (ref 0.7–3.1)
Lymphs: 17 %
MCH: 32.7 pg (ref 26.6–33.0)
MCHC: 33.1 g/dL (ref 31.5–35.7)
MCV: 99 fL — ABNORMAL HIGH (ref 79–97)
Monocytes Absolute: 0.6 x10E3/uL (ref 0.1–0.9)
Monocytes: 6 %
Neutrophils Absolute: 7.1 x10E3/uL — ABNORMAL HIGH (ref 1.4–7.0)
Neutrophils: 74 %
Platelets: 169 x10E3/uL (ref 150–450)
RBC: 3.3 x10E6/uL — ABNORMAL LOW (ref 3.77–5.28)
RDW: 12.7 % (ref 11.7–15.4)
WBC: 9.5 x10E3/uL (ref 3.4–10.8)

## 2024-09-04 LAB — LIPID PANEL W/O CHOL/HDL RATIO
Cholesterol, Total: 148 mg/dL (ref 100–199)
HDL: 36 mg/dL — ABNORMAL LOW (ref >39)
LDL Chol Calc (NIH): 89 mg/dL (ref 0–99)
Triglycerides: 127 mg/dL (ref 0–149)
VLDL Cholesterol Cal: 23 mg/dL (ref 5–40)

## 2024-09-04 LAB — HEMOGLOBIN A1C
Est. average glucose Bld gHb Est-mCnc: 103 mg/dL
Hgb A1c MFr Bld: 5.2 % (ref 4.8–5.6)

## 2024-10-06 ENCOUNTER — Ambulatory Visit: Admitting: Cardiovascular Disease

## 2024-10-06 ENCOUNTER — Encounter: Payer: Self-pay | Admitting: Cardiovascular Disease

## 2024-10-06 ENCOUNTER — Other Ambulatory Visit: Payer: Self-pay | Admitting: Internal Medicine

## 2024-10-06 VITALS — BP 125/72 | HR 68 | Ht 65.0 in | Wt 174.6 lb

## 2024-10-06 DIAGNOSIS — R0602 Shortness of breath: Secondary | ICD-10-CM

## 2024-10-06 DIAGNOSIS — I5033 Acute on chronic diastolic (congestive) heart failure: Secondary | ICD-10-CM

## 2024-10-06 DIAGNOSIS — N1832 Chronic kidney disease, stage 3b: Secondary | ICD-10-CM

## 2024-10-06 DIAGNOSIS — R0789 Other chest pain: Secondary | ICD-10-CM | POA: Diagnosis not present

## 2024-10-06 DIAGNOSIS — Z0130 Encounter for examination of blood pressure without abnormal findings: Secondary | ICD-10-CM

## 2024-10-06 DIAGNOSIS — I340 Nonrheumatic mitral (valve) insufficiency: Secondary | ICD-10-CM

## 2024-10-06 DIAGNOSIS — E782 Mixed hyperlipidemia: Secondary | ICD-10-CM | POA: Diagnosis not present

## 2024-10-06 MED ORDER — BACLOFEN 10 MG PO TABS: 10.0000 mg | ORAL_TABLET | Freq: Two times a day (BID) | ORAL | 1 refills | Status: AC | PRN

## 2024-10-06 MED ORDER — FUROSEMIDE 40 MG PO TABS: 40.0000 mg | ORAL_TABLET | Freq: Every day | ORAL | 1 refills | Status: AC

## 2024-10-06 NOTE — Progress Notes (Signed)
 Cardiology Office Note   Date:  10/06/2024   ID:  Elaine, Norris Nov 16, 1945, MRN [id number]  PCP:  Fernand Fredy RAMAN, MD  Cardiologist:  Denyse Fernand, MD      History of Present Illness: Elaine Norris is a 78 y.o. female who presents for  Chief Complaint  Patient presents with   Follow-up    3 month follow up    Has SOB on minimal exertion, with occasional chest pains.      Past Medical History:  Diagnosis Date   Anxiety    Related to surgery   Arthritis    hands   Cancer (HCC) 1970's   cervical   Depression    Controlled   Esophagitis    GERD (gastroesophageal reflux disease)    Gout    Hyperlipidemia    Hypertension    Kidney carcinoma (HCC) 06/2020   Myocardial infarction (HCC)    Osteoporosis    feet   Pseudoseizure    Sleep apnea    CPAP     Past Surgical History:  Procedure Laterality Date   CARDIAC CATHETERIZATION  05/2011   [facility]   CARD[facility]AC CAT[facility]IZATION  09/2011   [facility]   CARDIAC CAT[facility]TERIZATION  2012   [facility]   CATARACT EXTRACTION W/PHACO Left 02/17/2018   Procedure: CATARACT EXTRACTION PHACO AND INTRAOCULAR LENS PLACEMENT (IOC) LEFT;  Surgeon: Mittie Gaskin, MD;  Location: [facility];  Service: Ophthalmology;  Laterality: Left;   CATARACT EXTRACTION W/PHACO Right 03/10/2018   Procedure: CATARACT EXTRACTION PHACO AND INTRAOCULAR LENS PLACEMENT (IOC) RIGHT  COMPLICATED;  Surgeon: Mittie Gaskin, MD;  Location: [facility];  Service: Ophthalmology;  Laterality: Right;  NEEDS EARLY AM 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 malyuhin   CHOLECYSTECTOMY N/A 06/26/2018   Procedure: LAPAROSCOPIC CHOLECYSTECTOMY;  Surgeon: Stevie Herlene Righter, MD;  Location: [facility];  Service: General;  Laterality: N/A;   COLONOSCOPY     COLONOSCOPY WITH PROPOFOL  N/A 01/21/2020   Procedure: COLONOSCOPY WITH PROPOFOL ;  Surgeon: Toledo, Ladell POUR, MD;  Location: [facility];  Service: Gastroenterology;  Laterality: N/A;    ESOPHAGOGASTRODUODENOSCOPY (EGD) WITH PROPOFOL  N/A 01/21/2020   Procedure: ESOPHAGOGASTRODUODENOSCOPY (EGD) WITH PROPOFOL ;  Surgeon: Toledo, Ladell POUR, MD;  Location: [facility];  Service: Gastroenterology;  Laterality: N/A;   MULTIPLE TOOTH EXTRACTIONS     TONSILLECTOMY     WISDOM TOOTH EXTRACTION       Current Outpatient Medications  Medication Sig Dispense Refill   acetaminophen  (TYLENOL ) 500 MG tablet Take 500-1,000 mg by mouth every 6 (six) hours as needed for mild pain or fever.      allopurinol  (ZYLOPRIM ) 100 MG tablet TAKE 1 TABLET BY MOUTH EVERY DAY 90 tablet 3   aspirin  81 MG tablet Take 81 mg by mouth at bedtime.      atorvastatin  (LIPITOR) 40 MG tablet TAKE 1 TABLET BY MOUTH EVERY DAY. MAKE. FOLLOW UP APPOINTMENT WITH DOCTOR Braxxton Stoudt FOR REFILLS** 90 tablet 1   baclofen  (LIORESAL ) 10 MG tablet TAKE 1 TABLET BY MOUTH TWICE DAILY AS NEEDED 60 tablet 1   calcitRIOL (ROCALTROL) 0.25 MCG capsule Take 0.25 mcg by mouth daily.     calcium -vitamin D  250-100 MG-UNIT tablet Take 1 tablet by mouth 2 (two) times daily.     citalopram  (CELEXA ) 20 MG tablet Take 1 tablet (20 mg total) by mouth daily. 90 tablet 0   EPINEPHrine  0.3 mg/0.3 mL IJ SOAJ injection Inject 0.3 mg into the muscle as needed for anaphylaxis. 1 each 2  FARXIGA  10 MG TABS tablet Take 1 tablet (10 mg total) by mouth every morning. 90 tablet 1   furosemide  (LASIX ) 40 MG tablet Take 1 tablet (40 mg total) by mouth daily. 90 tablet 1   hydrALAZINE  (APRESOLINE ) 100 MG tablet TAKE 1 TABLET(100 MG) BY MOUTH THREE TIMES DAILY 90 tablet 3   labetalol  (NORMODYNE ) 100 MG tablet TAKE 1 TABLET BY MOUTH TWICE DAILY 180 tablet 1   losartan  (COZAAR ) 100 MG tablet Take 1 tablet (100 mg total) by mouth daily. 90 tablet 3   Multiple Vitamin (MULTIVITAMIN WITH MINERALS) TABS tablet Take 1 tablet by mouth daily.     nitroGLYCERIN  (NITROSTAT ) 0.4 MG SL tablet Place 1 tablet (0.4 mg total) under the tongue every 5 (five) minutes as needed for  chest pain. 25 tablet 6   pantoprazole  (PROTONIX ) 40 MG tablet TAKE 1 TABLET BY MOUTH EVERY DAY 90 tablet 1   spironolactone (ALDACTONE) 25 MG tablet Take 25 mg by mouth daily.     Current Facility-Administered Medications  Medication Dose Route Frequency Provider Last Rate Last Admin   denosumab  (PROLIA ) injection 60 mg  60 mg Subcutaneous Once Thapa, Sudan, MD        Allergies:   Fire ant, Gabapentin, Albuterol, and Codeine    Social History:   reports that she has never smoked. She has been exposed to tobacco smoke. She has never used smokeless tobacco. She reports that she does not drink alcohol and does not use drugs.   Family History:  family history includes Heart disease in her mother; Heart failure in her father.    ROS:     Review of Systems  Constitutional: Negative.   HENT: Negative.    Eyes: Negative.   Respiratory: Negative.    Gastrointestinal: Negative.   Genitourinary: Negative.   Musculoskeletal: Negative.   Skin: Negative.   Neurological: Negative.   Endo/Heme/Allergies: Negative.   Psychiatric/Behavioral: Negative.    All other systems reviewed and are negative.     All other systems are reviewed and negative.    PHYSICAL EXAM: VS:  BP 125/72   Pulse 68   Ht 5' 5 (1.651 m)   Wt 174 lb 9.6 oz (79.2 kg)   SpO2 98%   BMI 29.05 kg/m  , BMI Body mass index is 29.05 kg/m. Last weight:  Wt Readings from Last 3 Encounters:  10/06/24 174 lb 9.6 oz (79.2 kg)  09/03/24 172 lb 6.4 oz (78.2 kg)  07/10/24 174 lb 6.4 oz (79.1 kg)     Physical Exam Constitutional:      Appearance: Normal appearance.  Cardiovascular:     Rate and Rhythm: Normal rate and regular rhythm.     Heart sounds: Normal heart sounds.  Pulmonary:     Effort: Pulmonary effort is normal.     Breath sounds: Normal breath sounds.  Musculoskeletal:     Right lower leg: No edema.     Left lower leg: No edema.  Neurological:     Mental Status: She is alert.       EKG:    Recent Labs: 09/03/2024: ALT 19; BUN 36; Creatinine, Ser 1.92; Hemoglobin 10.8; Platelets 169; Potassium 4.8; Sodium 139    Lipid Panel    Component Value Date/Time   CHOL 148 09/03/2024 1428   TRIG 127 09/03/2024 1428   HDL 36 (L) 09/03/2024 1428   LDLCALC 89 09/03/2024 1428      Other studies Reviewed: Additional studies/ records that were reviewed today include:  Review of the above records demonstrates:       No data to display            ASSESSMENT AND PLAN:    ICD-10-CM   1. SOB (shortness of breath)  R06.02 MYOCARDIAL PERFUSION IMAGING    2. Nonrheumatic mitral valve regurgitation  I34.0 MYOCARDIAL PERFUSION IMAGING    3. Other chest pain  R07.89 MYOCARDIAL PERFUSION IMAGING    4. Mixed hyperlipidemia  E78.2 MYOCARDIAL PERFUSION IMAGING    5. Stage 3b chronic kidney disease (HCC)  N18.32 MYOCARDIAL PERFUSION IMAGING    6. CHF (congestive heart failure), NYHA class III, acute on chronic, diastolic (HCC)  I50.33 MYOCARDIAL PERFUSION IMAGING   Grade 2 diastolic dysfunction, but EF57%, decrease aldactone to 12.5 as creat 1.92, continue farxiga        Problem List Items Addressed This Visit       Cardiovascular and Mediastinum   Nonrheumatic mitral valve regurgitation   Relevant Orders   MYOCARDIAL PERFUSION IMAGING     Genitourinary   Stage 3b chronic kidney disease (HCC)   Relevant Orders   MYOCARDIAL PERFUSION IMAGING     Other   Chest pain   Relevant Orders   MYOCARDIAL PERFUSION IMAGING   Hyperlipidemia   Relevant Orders   MYOCARDIAL PERFUSION IMAGING   Other Visit Diagnoses       SOB (shortness of breath)    -  Primary   Relevant Orders   MYOCARDIAL PERFUSION IMAGING     CHF (congestive heart failure), NYHA class III, acute on chronic, diastolic (HCC)       Grade 2 diastolic dysfunction, but EF57%, decrease aldactone to 12.5 as creat 1.92, continue farxiga    Relevant Orders   MYOCARDIAL PERFUSION IMAGING          Disposition:    Return in about 5 weeks (around 11/10/2024) for stress test and f/u.    Total time spent: 30 minutes  Signed,  Denyse Bathe, MD  10/06/2024 9:33 AM    [facility]

## 2024-10-29 ENCOUNTER — Encounter

## 2024-10-29 DIAGNOSIS — I5033 Acute on chronic diastolic (congestive) heart failure: Secondary | ICD-10-CM | POA: Diagnosis not present

## 2024-10-29 DIAGNOSIS — I340 Nonrheumatic mitral (valve) insufficiency: Secondary | ICD-10-CM | POA: Diagnosis not present

## 2024-10-29 DIAGNOSIS — N1832 Chronic kidney disease, stage 3b: Secondary | ICD-10-CM

## 2024-10-29 DIAGNOSIS — R0602 Shortness of breath: Secondary | ICD-10-CM | POA: Diagnosis not present

## 2024-10-29 DIAGNOSIS — R0789 Other chest pain: Secondary | ICD-10-CM

## 2024-10-29 DIAGNOSIS — E782 Mixed hyperlipidemia: Secondary | ICD-10-CM

## 2024-10-29 MED ORDER — TECHNETIUM TC 99M SESTAMIBI GENERIC - CARDIOLITE
10.7000 | Freq: Once | INTRAVENOUS | Status: AC | PRN
  Administered 2024-10-29: 11:00:00 10.7 via INTRAVENOUS

## 2024-10-29 MED ORDER — TECHNETIUM TC 99M SESTAMIBI GENERIC - CARDIOLITE
31.0000 | Freq: Once | INTRAVENOUS | Status: AC | PRN
  Administered 2024-10-29: 13:00:00 31 via INTRAVENOUS

## 2024-11-02 ENCOUNTER — Other Ambulatory Visit: Payer: Self-pay | Admitting: Cardiovascular Disease

## 2024-11-02 DIAGNOSIS — I10 Essential (primary) hypertension: Secondary | ICD-10-CM

## 2024-11-02 DIAGNOSIS — I340 Nonrheumatic mitral (valve) insufficiency: Secondary | ICD-10-CM

## 2024-11-02 DIAGNOSIS — R0789 Other chest pain: Secondary | ICD-10-CM

## 2024-11-02 DIAGNOSIS — E782 Mixed hyperlipidemia: Secondary | ICD-10-CM

## 2024-11-02 DIAGNOSIS — G4733 Obstructive sleep apnea (adult) (pediatric): Secondary | ICD-10-CM

## 2024-11-09 ENCOUNTER — Encounter: Payer: Self-pay | Admitting: Cardiovascular Disease

## 2024-11-09 ENCOUNTER — Ambulatory Visit: Admitting: Cardiovascular Disease

## 2024-11-09 VITALS — BP 124/62 | HR 73 | Ht 65.0 in | Wt 180.4 lb

## 2024-11-09 DIAGNOSIS — R0602 Shortness of breath: Secondary | ICD-10-CM

## 2024-11-09 DIAGNOSIS — I340 Nonrheumatic mitral (valve) insufficiency: Secondary | ICD-10-CM | POA: Diagnosis not present

## 2024-11-09 DIAGNOSIS — N1832 Chronic kidney disease, stage 3b: Secondary | ICD-10-CM

## 2024-11-09 DIAGNOSIS — I10 Essential (primary) hypertension: Secondary | ICD-10-CM

## 2024-11-09 DIAGNOSIS — R0789 Other chest pain: Secondary | ICD-10-CM

## 2024-11-09 DIAGNOSIS — I5033 Acute on chronic diastolic (congestive) heart failure: Secondary | ICD-10-CM

## 2024-11-09 DIAGNOSIS — E782 Mixed hyperlipidemia: Secondary | ICD-10-CM | POA: Diagnosis not present

## 2024-11-09 NOTE — Progress Notes (Signed)
 "     Cardiology Office Note   Date:  11/09/2024   ID:  Elaine Norris, Elaine Norris 02/25/1946, MRN [id number]  PCP:  Fernand Fredy RAMAN, MD  Cardiologist:  Denyse Fernand, MD      History of Present Illness: Elaine Norris is a 78 y.o. female who presents for  Chief Complaint  Patient presents with   Follow-up    5 week nst results.    Had chest pain and palpitation and SOB, twice.      Past Medical History:  Diagnosis Date   Anxiety    Related to surgery   Arthritis    hands   Cancer (HCC) 1970's   cervical   Depression    Controlled   Esophagitis    GERD (gastroesophageal reflux disease)    Gout    Hyperlipidemia    Hypertension    Kidney carcinoma (HCC) 06/2020   Myocardial infarction (HCC)    Osteoporosis    feet   Pseudoseizure    Sleep apnea    CPAP     Past Surgical History:  Procedure Laterality Date   CARDIAC CATHETERIZATION  05/2011   [facility]   CARD[facility]AC [facility]IZATION  09/2011   [facility]   CARDIAC CATHETERIZATION  2012   [facility]   CATARACT EXTRACTION W/PHACO Left 02/17/2018   Procedure: CATARACT EXTRACTION PHACO AND INTRAOCULAR LENS PLACEMENT (IOC) LEFT;  Surgeon: Mittie Gaskin, MD;  Location: [facility];  Service: Ophthalmology;  Laterality: Left;   CATARACT EXTRACTION W/PHACO Right 03/10/2018   Procedure: CATARACT EXTRACTION PHACO AND INTRAOCULAR LENS PLACEMENT (IOC) RIGHT  COMPLICATED;  Surgeon: Mittie Gaskin, MD;  Location: [facility];  Service: Ophthalmology;  Laterality: Right;  NEEDS EARLY AM 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 malyuhin   CHOLECYSTECTOMY N/A 06/26/2018   Procedure: LAPAROSCOPIC CHOLECYSTECTOMY;  Surgeon: Stevie Herlene Righter, MD;  Location: [facility];  Service: General;  Laterality: N/A;   COLONOSCOPY     COLONOSCOPY WITH PROPOFOL  N/A 01/21/2020   Procedure: COLONOSCOPY WITH PROPOFOL ;  Surgeon: Toledo, Ladell POUR, MD;  Location: [facility];  Service: Gastroenterology;  Laterality: N/A;    ESOPHAGOGASTRODUODENOSCOPY (EGD) WITH PROPOFOL  N/A 01/21/2020   Procedure: ESOPHAGOGASTRODUODENOSCOPY (EGD) WITH PROPOFOL ;  Surgeon: Toledo, Ladell POUR, MD;  Location: [facility];  Service: Gastroenterology;  Laterality: N/A;   MULTIPLE TOOTH EXTRACTIONS     TONSILLECTOMY     WISDOM TOOTH EXTRACTION       Current Outpatient Medications  Medication Sig Dispense Refill   acetaminophen  (TYLENOL ) 500 MG tablet Take 500-1,000 mg by mouth every 6 (six) hours as needed for mild pain or fever.      allopurinol  (ZYLOPRIM ) 100 MG tablet TAKE 1 TABLET BY MOUTH EVERY DAY 90 tablet 3   aspirin  81 MG tablet Take 81 mg by mouth at bedtime.      atorvastatin  (LIPITOR) 40 MG tablet TAKE 1 TABLET BY MOUTH EVERY DAY. MAKE. FOLLOW UP APPOINTMENT WITH DOCTOR Lafawn Lenoir FOR REFILLS** 90 tablet 1   baclofen  (LIORESAL ) 10 MG tablet Take 1 tablet (10 mg total) by mouth 2 (two) times daily as needed. 60 tablet 1   calcitRIOL (ROCALTROL) 0.25 MCG capsule Take 0.25 mcg by mouth daily.     calcium -vitamin D  250-100 MG-UNIT tablet Take 1 tablet by mouth 2 (two) times daily.     citalopram  (CELEXA ) 20 MG tablet Take 1 tablet (20 mg total) by mouth daily. 90 tablet 0   EPINEPHrine  0.3 mg/0.3 mL IJ SOAJ injection Inject 0.3 mg into the muscle  as needed for anaphylaxis. 1 each 2   FARXIGA  10 MG TABS tablet Take 1 tablet (10 mg total) by mouth every morning. 90 tablet 1   furosemide  (LASIX ) 40 MG tablet Take 1 tablet (40 mg total) by mouth daily. 90 tablet 1   hydrALAZINE  (APRESOLINE ) 100 MG tablet TAKE 1 TABLET(100 MG) BY MOUTH THREE TIMES DAILY 90 tablet 3   labetalol  (NORMODYNE ) 100 MG tablet TAKE 1 TABLET BY MOUTH TWICE DAILY 180 tablet 1   losartan  (COZAAR ) 100 MG tablet Take 1 tablet (100 mg total) by mouth daily. 90 tablet 3   Multiple Vitamin (MULTIVITAMIN WITH MINERALS) TABS tablet Take 1 tablet by mouth daily.     nitroGLYCERIN  (NITROSTAT ) 0.4 MG SL tablet Place 1 tablet (0.4 mg total) under the tongue every 5 (five)  minutes as needed for chest pain. 25 tablet 6   pantoprazole  (PROTONIX ) 40 MG tablet TAKE 1 TABLET BY MOUTH EVERY DAY 90 tablet 1   spironolactone (ALDACTONE) 25 MG tablet Take 25 mg by mouth daily.     Current Facility-Administered Medications  Medication Dose Route Frequency Provider Last Rate Last Admin   denosumab  (PROLIA ) injection 60 mg  60 mg Subcutaneous Once Thapa, [place], MD        Allergies:   Fire ant, Gabapentin, Albuterol, and Codeine    Social History:   reports that she has never smoked. She has been exposed to tobacco smoke. She has never used smokeless tobacco. She reports that she does not drink alcohol and does not use drugs.   Family History:  family history includes Heart disease in her mother; Heart failure in her father.    ROS:     Review of Systems  Constitutional: Negative.   HENT: Negative.    Eyes: Negative.   Respiratory: Negative.    Gastrointestinal: Negative.   Genitourinary: Negative.   Musculoskeletal: Negative.   Skin: Negative.   Neurological: Negative.   Endo/Heme/Allergies: Negative.   Psychiatric/Behavioral: Negative.    All other systems reviewed and are negative.     All other systems are reviewed and negative.    PHYSICAL EXAM: VS:  BP 124/62   Pulse 73   Ht 5' 5 (1.651 m)   Wt 180 lb 6.4 oz (81.8 kg)   SpO2 98%   BMI 30.02 kg/m  , BMI Body mass index is 30.02 kg/m. Last weight:  Wt Readings from Last 3 Encounters:  11/09/24 180 lb 6.4 oz (81.8 kg)  10/06/24 174 lb 9.6 oz (79.2 kg)  09/03/24 172 lb 6.4 oz (78.2 kg)     Physical Exam Constitutional:      Appearance: Normal appearance.  Cardiovascular:     Rate and Rhythm: Normal rate and regular rhythm.     Heart sounds: Normal heart sounds.  Pulmonary:     Effort: Pulmonary effort is normal.     Breath sounds: Normal breath sounds.  Musculoskeletal:     Right lower leg: No edema.     Left lower leg: No edema.  Neurological:     Mental Status: She is alert.        EKG:   Recent Labs: 09/03/2024: ALT 19; BUN 36; Creatinine, Ser 1.92; Hemoglobin 10.8; Platelets 169; Potassium 4.8; Sodium 139    Lipid Panel    Component Value Date/Time   CHOL 148 09/03/2024 1428   TRIG 127 09/03/2024 1428   HDL 36 (L) 09/03/2024 1428   LDLCALC 89 09/03/2024 1428      Other studies  Reviewed: Additional studies/ records that were reviewed today include:  Review of the above records demonstrates:       No data to display            ASSESSMENT AND PLAN:    ICD-10-CM   1. Essential hypertension, benign  I10     2. Other chest pain  R07.89    stresss test was fine, no ischaemia.    3. Nonrheumatic mitral valve regurgitation  I34.0     4. Mixed hyperlipidemia  E78.2     5. SOB (shortness of breath)  R06.02     6. Stage 3b chronic kidney disease (HCC)  N18.32     7. CHF (congestive heart failure), NYHA class III, acute on chronic, diastolic (HCC)  I50.33        Problem List Items Addressed This Visit       Cardiovascular and Mediastinum   Essential hypertension, benign - Primary   Nonrheumatic mitral valve regurgitation     Genitourinary   Stage 3b chronic kidney disease (HCC)     Other   Chest pain   Hyperlipidemia   Other Visit Diagnoses       SOB (shortness of breath)         CHF (congestive heart failure), NYHA class III, acute on chronic, diastolic (HCC)              Disposition:   Return in about 3 months (around 02/07/2025).    Total time spent: 40 minutes  Signed,  Denyse Bathe, MD  11/09/2024 9:42 AM    [facility] "

## 2024-11-16 DIAGNOSIS — K219 Gastro-esophageal reflux disease without esophagitis: Secondary | ICD-10-CM

## 2024-11-16 DIAGNOSIS — R0789 Other chest pain: Secondary | ICD-10-CM

## 2024-11-16 DIAGNOSIS — N1832 Chronic kidney disease, stage 3b: Secondary | ICD-10-CM

## 2024-11-16 DIAGNOSIS — I340 Nonrheumatic mitral (valve) insufficiency: Secondary | ICD-10-CM

## 2024-11-16 DIAGNOSIS — R0602 Shortness of breath: Secondary | ICD-10-CM

## 2024-11-16 DIAGNOSIS — I10 Essential (primary) hypertension: Secondary | ICD-10-CM

## 2024-11-16 DIAGNOSIS — E782 Mixed hyperlipidemia: Secondary | ICD-10-CM

## 2024-12-04 ENCOUNTER — Ambulatory Visit (INDEPENDENT_AMBULATORY_CARE_PROVIDER_SITE_OTHER)

## 2024-12-04 VITALS — BP 130/78 | HR 85 | Ht 65.0 in | Wt 181.0 lb

## 2024-12-04 DIAGNOSIS — E782 Mixed hyperlipidemia: Secondary | ICD-10-CM

## 2024-12-04 DIAGNOSIS — R7309 Other abnormal glucose: Secondary | ICD-10-CM | POA: Diagnosis not present

## 2024-12-04 DIAGNOSIS — M1009 Idiopathic gout, multiple sites: Secondary | ICD-10-CM | POA: Diagnosis not present

## 2024-12-04 DIAGNOSIS — E6609 Other obesity due to excess calories: Secondary | ICD-10-CM

## 2024-12-04 DIAGNOSIS — E66811 Obesity, class 1: Secondary | ICD-10-CM

## 2024-12-04 DIAGNOSIS — Z13820 Encounter for screening for osteoporosis: Secondary | ICD-10-CM

## 2024-12-04 DIAGNOSIS — I10 Essential (primary) hypertension: Secondary | ICD-10-CM | POA: Diagnosis not present

## 2024-12-04 DIAGNOSIS — Z6830 Body mass index (BMI) 30.0-30.9, adult: Secondary | ICD-10-CM | POA: Diagnosis not present

## 2024-12-04 DIAGNOSIS — I5033 Acute on chronic diastolic (congestive) heart failure: Secondary | ICD-10-CM | POA: Diagnosis not present

## 2024-12-04 DIAGNOSIS — N184 Chronic kidney disease, stage 4 (severe): Secondary | ICD-10-CM

## 2024-12-04 DIAGNOSIS — K219 Gastro-esophageal reflux disease without esophagitis: Secondary | ICD-10-CM

## 2024-12-04 MED ORDER — ALLOPURINOL 100 MG PO TABS: 100.0000 mg | ORAL_TABLET | Freq: Every day | ORAL | 3 refills | Status: AC

## 2024-12-04 NOTE — Assessment & Plan Note (Signed)
-   Continue healthy diet and exercise as tolerated. - Drink plenty of water daily. - Continue medications as prescribed. - Check labs today.

## 2024-12-04 NOTE — Assessment & Plan Note (Signed)
-   Check labs today. - Refill allopurinol  and take as prescribed. - Avoid dietary triggers.

## 2024-12-04 NOTE — Assessment & Plan Note (Signed)
-   Bone Density ordered to be completed.

## 2024-12-04 NOTE — Progress Notes (Signed)
 "  Established Patient Office Visit  Subjective:  Patient ID: Elaine Norris, female    DOB: 11/09/1946  Age: 79 y.o. MRN: [id number]  Chief Complaint  Patient presents with   Follow-up    3 month follow up    Patient is here today for her 6 months follow up.  She has been feeling well since last appointment.   She does not have additional concerns to discuss today. She denies any chest pain and shortness of breath at this time. She denies any abdominal complaints at this time. She reports taking her medications as prescribed without side effects.  Labs are due today.  She needs refills.  She is due for DEXA scan; will order to be collected today.  I have reviewed her active problem list, medication list, allergies, family history, social history, health maintenance, notes from last encounter, lab results for her appointment today.      No other concerns at this time.   Past Medical History:  Diagnosis Date   Anxiety    Related to surgery   Arthritis    hands   Cancer (HCC) 1970's   cervical   Depression    Controlled   Esophagitis    GERD (gastroesophageal reflux disease)    Gout    Hyperlipidemia    Hypertension    Kidney carcinoma (HCC) 06/2020   Myocardial infarction (HCC)    Osteoporosis    feet   Pseudoseizure    Sleep apnea    CPAP    Past Surgical History:  Procedure Laterality Date   CARDIAC CATHETERIZATION  05/2011   [facility]   CARDIAC CAT[facility]TERIZATION  09/2011   [facility]   CARDIAC CATHETERIZATION  2012   [facility]   CATARACT EXTRACTION W/PHACO Left 02/17/2018   Procedure: CATARACT EXTRACTION PHACO AND INTRAOCULAR LENS PLACEMENT (IOC) LEFT;  Surgeon: Mittie Gaskin, MD;  Location: [facility];  Service: Ophthalmology;  Laterality: Left;   CATARACT EXTRACTION W/PHACO Right 03/10/2018   Procedure: CATARACT EXTRACTION PHACO AND INTRAOCULAR LENS PLACEMENT (IOC) RIGHT  COMPLICATED;  Surgeon: Mittie Gaskin, MD;  Location: [facility];   Service: Ophthalmology;  Laterality: Right;  NEEDS EARLY AM 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 [employer]   CHOLECYSTECTOMY N/A 06/26/2018   Procedure: LAPAROSCOPIC CHOLECYSTECTOMY;  Surgeon: Stevie Herlene Righter, MD;  Location: [facility];  Service: General;  Laterality: N/A;   COLONOSCOPY     COLONOSCOPY WITH PROPOFOL  N/A 01/21/2020   Procedure: COLONOSCOPY WITH PROPOFOL ;  Surgeon: Toledo, Ladell POUR, MD;  Location: [facility];  Service: Gastroenterology;  Laterality: N/A;   ESOPHAGOGASTRODUODENOSCOPY (EGD) WITH PROPOFOL  N/A 01/21/2020   Procedure: ESOPHAGOGASTRODUODENOSCOPY (EGD) WITH PROPOFOL ;  Surgeon: Toledo, Ladell POUR, MD;  Location: [facility];  Service: Gastroenterology;  Laterality: N/A;   MULTIPLE TOOTH EXTRACTIONS     TONSILLECTOMY     WISDOM TOOTH EXTRACTION      Social History   Socioeconomic History   Marital status: Widowed    Spouse name: Not on file   Number of children: Not on file   Years of education: Not on file   Highest education level: Not on file  Occupational History   Occupation: retied  Tobacco Use   Smoking status: Never    Passive exposure: Yes   Smokeless tobacco: Never  Vaping Use   Vaping status: Never Used  Substance and Sexual Activity   Alcohol use: No   Drug use: No   Sexual activity: Not Currently  Other Topics Concern   Not on file  Social History Narrative   Not on file   Social Drivers of Health   Tobacco Use: Medium Risk (12/04/2024)   Patient History    Smoking Tobacco Use: Never    Smokeless Tobacco Use: Never    Passive Exposure: Yes  Financial Resource Strain: High Risk (08/12/2024)   Received from [facility]   Overall Financial Resource Strain (CARDIA)    Difficulty of Paying Living Expenses: Hard  Food Insecurity: No Food Insecurity (08/12/2024)   Received from [facility]   Epic    Within the past 12 months, you worried that your food would run out before you got the money to buy  more.: Never true    Within the past 12 months, the food you bought just didn't last and you didn't have money to get more.: Never true  Transportation Needs: No Transportation Needs (08/12/2024)   Received from [facility] - Transportation    In the past 12 months, has lack of transportation kept you from medical appointments or from getting medications?: No    Lack of Transportation (Non-Medical): No  Physical Activity: Not on file  Stress: Not on file  Social Connections: Not on file  Intimate Partner Violence: Not on file  Depression (PHQ2-9): Medium Risk (10/15/2023)   Depression (PHQ2-9)    PHQ-2 Score: 8  Alcohol Screen: Not on file  Housing: Low Risk  (08/12/2024)   Received from [facility]   Epic    In the last 12 months, was there a time when you were not able to pay the mortgage or rent on time?: No    In the past 12 months, how many times have you moved where you were living?: 0    At any time in the past 12 months, were you homeless or living in a shelter (including now)?: No  Utilities: Not At Risk (08/12/2024)   Received from [facility]   Epic    In the past 12 months has the electric, gas, oil, or water company threatened to shut off services in your home?: No  Health Literacy: Not on file    Family History  Problem Relation Age of Onset   Heart disease Mother    Heart failure Father    Breast cancer Neg Hx     Allergies[1]  Review of Systems  Constitutional:  Negative for malaise/fatigue.  HENT: Negative.    Eyes:  Negative for blurred vision and pain.  Respiratory:  Negative for cough and shortness of breath.   Cardiovascular:  Negative for chest pain, palpitations, claudication and leg swelling.  Gastrointestinal:  Negative for abdominal pain, blood in stool, constipation, diarrhea, nausea and vomiting.  Genitourinary:  Negative for dysuria, frequency and urgency.  Musculoskeletal: Negative.    Skin: Negative.   Neurological:  Negative for dizziness, tingling, sensory change and headaches.  Endo/Heme/Allergies: Negative.   Psychiatric/Behavioral: Negative.         Objective:   BP 130/78   Pulse 85   Ht 5' 5 (1.651 m)   Wt 181 lb (82.1 kg)   SpO2 98%   BMI 30.12 kg/m   Vitals:   12/04/24 1019  BP: 130/78  Pulse: 85  Height: 5' 5 (1.651 m)  Weight: 181 lb (82.1 kg)  SpO2: 98%  BMI (Calculated): 30.12    Physical Exam Vitals and nursing note reviewed.  Constitutional:      Appearance: Normal appearance.  HENT:  Head: Normocephalic.  Eyes:     Extraocular Movements: Extraocular movements intact.     Pupils: Pupils are equal, round, and reactive to light.  Cardiovascular:     Rate and Rhythm: Normal rate and regular rhythm.     Pulses: Normal pulses.     Heart sounds: Normal heart sounds. No murmur heard. Pulmonary:     Effort: Pulmonary effort is normal. No respiratory distress.     Breath sounds: Normal breath sounds.  Abdominal:     General: There is no distension.     Tenderness: There is no abdominal tenderness.  Musculoskeletal:        General: No tenderness. Normal range of motion.     Cervical back: Normal range of motion and neck supple.     Right lower leg: No edema.     Left lower leg: No edema.  Skin:    General: Skin is warm and dry.     Coloration: Skin is not jaundiced.     Findings: No erythema.  Neurological:     General: No focal deficit present.     Mental Status: She is alert and oriented to person, place, and time.  Psychiatric:        Mood and Affect: Mood normal.        Speech: Speech normal.        Behavior: Behavior is cooperative.        Cognition and Memory: Memory is not impaired.      No results found for any visits on 12/04/24.  No results found for this or any previous visit (from the past 2160 hours).     Assessment & Plan:   Assessment & Plan Essential hypertension, benign Mixed  hyperlipidemia Elevated glucose Class 1 obesity due to excess calories with serious comorbidity and body mass index (BMI) of 30.0 to 30.9 in adult Stage 4 chronic kidney disease (HCC) CHF (congestive heart failure), NYHA class III, acute on chronic, diastolic (HCC) - Continue healthy diet and exercise as tolerated. - Drink plenty of water daily. - Continue medications as prescribed. - Check labs today. Gastroesophageal reflux disease without esophagitis - Check labs today. - Continue medications as prescribed. - Avoid dietary triggers. Idiopathic gout of multiple sites, unspecified chronicity - Check labs today. - Refill allopurinol  and take as prescribed. - Avoid dietary triggers. Osteoporosis screening - Bone Density ordered to be completed.    Return in about 3 months (around 03/04/2025) for Medicare AWV.   Total time spent: 30 minutes  Elaine DELENA Cain, FNP  12/04/2024   This document may have been prepared by [facility] Voice Recognition software and as such may include unintentional dictation errors.     [1]  Allergies Allergen Reactions   Fire Ant Anaphylaxis   Gabapentin     Muscles jerks, weakness and difficulty swallowing - Noted in hospital stay   Albuterol Other (See Comments)    Pt reports seizures    Codeine Other (See Comments)    Altered mental status    "

## 2024-12-04 NOTE — Assessment & Plan Note (Signed)
-   Check labs today. - Continue medications as prescribed. - Avoid dietary triggers.

## 2024-12-05 LAB — LIPID PANEL W/O CHOL/HDL RATIO
Cholesterol, Total: 157 mg/dL (ref 100–199)
HDL: 37 mg/dL — ABNORMAL LOW
LDL Chol Calc (NIH): 95 mg/dL (ref 0–99)
Triglycerides: 139 mg/dL (ref 0–149)
VLDL Cholesterol Cal: 25 mg/dL (ref 5–40)

## 2024-12-05 LAB — HEMOGLOBIN A1C
Est. average glucose Bld gHb Est-mCnc: 103 mg/dL
Hgb A1c MFr Bld: 5.2 % (ref 4.8–5.6)

## 2024-12-05 LAB — CMP14+EGFR
ALT: 31 [IU]/L (ref 0–32)
AST: 28 [IU]/L (ref 0–40)
Albumin: 4 g/dL (ref 3.8–4.8)
Alkaline Phosphatase: 96 [IU]/L (ref 49–135)
BUN/Creatinine Ratio: 14 (ref 12–28)
BUN: 25 mg/dL (ref 8–27)
Bilirubin Total: 0.4 mg/dL (ref 0.0–1.2)
CO2: 22 mmol/L (ref 20–29)
Calcium: 9.6 mg/dL (ref 8.7–10.3)
Chloride: 104 mmol/L (ref 96–106)
Creatinine, Ser: 1.73 mg/dL — ABNORMAL HIGH (ref 0.57–1.00)
Globulin, Total: 2.5 g/dL (ref 1.5–4.5)
Glucose: 95 mg/dL (ref 70–99)
Potassium: 4.8 mmol/L (ref 3.5–5.2)
Sodium: 140 mmol/L (ref 134–144)
Total Protein: 6.5 g/dL (ref 6.0–8.5)
eGFR: 30 mL/min/{1.73_m2} — ABNORMAL LOW

## 2024-12-05 LAB — CBC WITH DIFFERENTIAL/PLATELET
Basophils Absolute: 0 10*3/uL (ref 0.0–0.2)
Basos: 1 %
EOS (ABSOLUTE): 0.2 10*3/uL (ref 0.0–0.4)
Eos: 3 %
Hematocrit: 35.7 % (ref 34.0–46.6)
Hemoglobin: 11.4 g/dL (ref 11.1–15.9)
Immature Grans (Abs): 0 10*3/uL (ref 0.0–0.1)
Immature Granulocytes: 0 %
Lymphocytes Absolute: 1.8 10*3/uL (ref 0.7–3.1)
Lymphs: 22 %
MCH: 32.1 pg (ref 26.6–33.0)
MCHC: 31.9 g/dL (ref 31.5–35.7)
MCV: 101 fL — ABNORMAL HIGH (ref 79–97)
Monocytes Absolute: 0.7 10*3/uL (ref 0.1–0.9)
Monocytes: 8 %
Neutrophils Absolute: 5.4 10*3/uL (ref 1.4–7.0)
Neutrophils: 66 %
Platelets: 193 10*3/uL (ref 150–450)
RBC: 3.55 x10E6/uL — ABNORMAL LOW (ref 3.77–5.28)
RDW: 12.6 % (ref 11.7–15.4)
WBC: 8.1 10*3/uL (ref 3.4–10.8)

## 2024-12-05 LAB — URIC ACID: Uric Acid: 5.4 mg/dL (ref 3.1–7.9)

## 2024-12-08 ENCOUNTER — Ambulatory Visit: Payer: Self-pay

## 2024-12-11 NOTE — Progress Notes (Signed)
 Patient notified

## 2024-12-23 ENCOUNTER — Other Ambulatory Visit

## 2025-02-23 ENCOUNTER — Ambulatory Visit: Admitting: Cardiovascular Disease

## 2025-03-04 ENCOUNTER — Ambulatory Visit: Admitting: Internal Medicine
